# Patient Record
Sex: Female | Born: 1935 | Race: White | Hispanic: No | Smoking: Never smoker
Health system: Southern US, Community
[De-identification: ages and names within clinical notes are randomized; demographics above are authoritative.]

## PROBLEM LIST (undated history)

## (undated) DIAGNOSIS — G459 Transient cerebral ischemic attack, unspecified: Secondary | ICD-10-CM

## (undated) DIAGNOSIS — Z952 Presence of prosthetic heart valve: Secondary | ICD-10-CM

## (undated) DIAGNOSIS — N189 Chronic kidney disease, unspecified: Secondary | ICD-10-CM

## (undated) DIAGNOSIS — M1990 Unspecified osteoarthritis, unspecified site: Secondary | ICD-10-CM

## (undated) DIAGNOSIS — E7800 Pure hypercholesterolemia, unspecified: Secondary | ICD-10-CM

## (undated) DIAGNOSIS — I4891 Unspecified atrial fibrillation: Secondary | ICD-10-CM

## (undated) DIAGNOSIS — I272 Pulmonary hypertension, unspecified: Secondary | ICD-10-CM

## (undated) DIAGNOSIS — Z9889 Other specified postprocedural states: Secondary | ICD-10-CM

## (undated) DIAGNOSIS — D649 Anemia, unspecified: Secondary | ICD-10-CM

## (undated) DIAGNOSIS — I5032 Chronic diastolic (congestive) heart failure: Secondary | ICD-10-CM

## (undated) DIAGNOSIS — I10 Essential (primary) hypertension: Secondary | ICD-10-CM

## (undated) HISTORY — PX: APPENDECTOMY: SHX54

## (undated) HISTORY — PX: SHOULDER SURGERY: SHX246

## (undated) HISTORY — PX: KNEE ARTHROSCOPY: SUR90

## (undated) HISTORY — DX: Presence of prosthetic heart valve: Z95.2

## (undated) HISTORY — DX: Pulmonary hypertension, unspecified: I27.20

## (undated) HISTORY — PX: HIP FRACTURE SURGERY: SHX118

## (undated) HISTORY — DX: Essential (primary) hypertension: I10

## (undated) HISTORY — DX: Anemia, unspecified: D64.9

## (undated) HISTORY — DX: Unspecified osteoarthritis, unspecified site: M19.90

## (undated) HISTORY — DX: Chronic diastolic (congestive) heart failure: I50.32

## (undated) HISTORY — DX: Other specified postprocedural states: Z98.890

## (undated) HISTORY — DX: Chronic kidney disease, unspecified: N18.9

## (undated) HISTORY — PX: TONSILLECTOMY: SUR1361

## (undated) HISTORY — DX: Transient cerebral ischemic attack, unspecified: G45.9

## (undated) HISTORY — PX: HERNIA REPAIR: SHX51

## (undated) HISTORY — PX: ABDOMINAL HYSTERECTOMY: SHX81

## (undated) HISTORY — DX: Pure hypercholesterolemia, unspecified: E78.00

## (undated) HISTORY — PX: MITRAL VALVE REPLACEMENT: SHX147

---

## 1998-06-26 ENCOUNTER — Encounter: Admission: RE | Admit: 1998-06-26 | Discharge: 1998-09-24 | Payer: Self-pay | Admitting: *Deleted

## 2001-08-14 ENCOUNTER — Encounter: Payer: Self-pay | Admitting: *Deleted

## 2001-08-14 ENCOUNTER — Inpatient Hospital Stay (HOSPITAL_COMMUNITY): Admission: EM | Admit: 2001-08-14 | Discharge: 2001-08-25 | Payer: Self-pay | Admitting: *Deleted

## 2001-08-15 ENCOUNTER — Encounter: Payer: Self-pay | Admitting: Neurology

## 2001-08-16 ENCOUNTER — Encounter: Payer: Self-pay | Admitting: *Deleted

## 2002-01-15 ENCOUNTER — Emergency Department (HOSPITAL_COMMUNITY): Admission: EM | Admit: 2002-01-15 | Discharge: 2002-01-15 | Payer: Self-pay | Admitting: Emergency Medicine

## 2002-01-15 ENCOUNTER — Encounter: Payer: Self-pay | Admitting: Emergency Medicine

## 2002-03-08 ENCOUNTER — Other Ambulatory Visit: Admission: RE | Admit: 2002-03-08 | Discharge: 2002-03-08 | Payer: Self-pay | Admitting: Family Medicine

## 2002-10-04 ENCOUNTER — Encounter: Payer: Self-pay | Admitting: Internal Medicine

## 2002-10-04 ENCOUNTER — Ambulatory Visit (HOSPITAL_COMMUNITY): Admission: RE | Admit: 2002-10-04 | Discharge: 2002-10-05 | Payer: Self-pay | Admitting: Internal Medicine

## 2003-10-31 ENCOUNTER — Emergency Department (HOSPITAL_COMMUNITY): Admission: EM | Admit: 2003-10-31 | Discharge: 2003-10-31 | Payer: Self-pay | Admitting: Emergency Medicine

## 2003-11-10 ENCOUNTER — Inpatient Hospital Stay (HOSPITAL_COMMUNITY): Admission: EM | Admit: 2003-11-10 | Discharge: 2003-11-11 | Payer: Self-pay | Admitting: Emergency Medicine

## 2003-11-16 DIAGNOSIS — Z952 Presence of prosthetic heart valve: Secondary | ICD-10-CM

## 2003-11-16 DIAGNOSIS — Z9889 Other specified postprocedural states: Secondary | ICD-10-CM

## 2003-11-16 HISTORY — DX: Other specified postprocedural states: Z98.890

## 2003-11-16 HISTORY — DX: Presence of prosthetic heart valve: Z95.2

## 2003-12-25 ENCOUNTER — Inpatient Hospital Stay (HOSPITAL_COMMUNITY): Admission: EM | Admit: 2003-12-25 | Discharge: 2003-12-27 | Payer: Self-pay | Admitting: Emergency Medicine

## 2004-01-10 ENCOUNTER — Ambulatory Visit (HOSPITAL_COMMUNITY): Admission: RE | Admit: 2004-01-10 | Discharge: 2004-01-10 | Payer: Self-pay | Admitting: *Deleted

## 2004-02-10 ENCOUNTER — Inpatient Hospital Stay (HOSPITAL_COMMUNITY): Admission: EM | Admit: 2004-02-10 | Discharge: 2004-02-14 | Payer: Self-pay | Admitting: Emergency Medicine

## 2004-02-11 ENCOUNTER — Encounter: Payer: Self-pay | Admitting: Cardiovascular Disease

## 2004-02-18 ENCOUNTER — Inpatient Hospital Stay (HOSPITAL_BASED_OUTPATIENT_CLINIC_OR_DEPARTMENT_OTHER): Admission: RE | Admit: 2004-02-18 | Discharge: 2004-02-18 | Payer: Self-pay | Admitting: Cardiovascular Disease

## 2004-02-26 ENCOUNTER — Ambulatory Visit (HOSPITAL_COMMUNITY): Admission: RE | Admit: 2004-02-26 | Discharge: 2004-02-26 | Payer: Self-pay | Admitting: Cardiovascular Disease

## 2004-02-26 ENCOUNTER — Encounter: Payer: Self-pay | Admitting: Cardiovascular Disease

## 2004-04-09 ENCOUNTER — Observation Stay (HOSPITAL_COMMUNITY): Admission: AD | Admit: 2004-04-09 | Discharge: 2004-04-10 | Payer: Self-pay | Admitting: *Deleted

## 2004-09-17 ENCOUNTER — Ambulatory Visit: Payer: Self-pay | Admitting: Internal Medicine

## 2004-09-17 ENCOUNTER — Ambulatory Visit: Payer: Self-pay | Admitting: Hematology & Oncology

## 2004-09-23 ENCOUNTER — Ambulatory Visit: Payer: Self-pay | Admitting: *Deleted

## 2004-10-06 ENCOUNTER — Ambulatory Visit: Payer: Self-pay | Admitting: Internal Medicine

## 2004-10-14 ENCOUNTER — Ambulatory Visit: Payer: Self-pay | Admitting: Cardiology

## 2004-10-16 ENCOUNTER — Ambulatory Visit: Payer: Self-pay | Admitting: Cardiovascular Disease

## 2004-10-30 ENCOUNTER — Ambulatory Visit: Payer: Self-pay | Admitting: Cardiology

## 2004-11-12 ENCOUNTER — Ambulatory Visit: Payer: Self-pay | Admitting: Hematology & Oncology

## 2004-11-18 ENCOUNTER — Ambulatory Visit: Payer: Self-pay | Admitting: Cardiology

## 2004-12-09 ENCOUNTER — Ambulatory Visit: Payer: Self-pay | Admitting: Cardiology

## 2004-12-16 ENCOUNTER — Ambulatory Visit: Payer: Self-pay | Admitting: Cardiology

## 2004-12-25 ENCOUNTER — Ambulatory Visit: Payer: Self-pay | Admitting: Cardiology

## 2004-12-25 ENCOUNTER — Encounter: Admission: RE | Admit: 2004-12-25 | Discharge: 2004-12-25 | Payer: Self-pay | Admitting: Family Medicine

## 2005-01-06 ENCOUNTER — Ambulatory Visit: Payer: Self-pay | Admitting: Cardiology

## 2005-01-20 ENCOUNTER — Ambulatory Visit: Payer: Self-pay | Admitting: Hematology & Oncology

## 2005-01-20 ENCOUNTER — Ambulatory Visit: Payer: Self-pay | Admitting: Cardiovascular Disease

## 2005-01-27 ENCOUNTER — Ambulatory Visit: Payer: Self-pay | Admitting: Cardiology

## 2005-02-17 ENCOUNTER — Ambulatory Visit: Payer: Self-pay | Admitting: *Deleted

## 2005-03-03 ENCOUNTER — Ambulatory Visit: Payer: Self-pay | Admitting: Cardiology

## 2005-03-16 ENCOUNTER — Ambulatory Visit: Payer: Self-pay | Admitting: Hematology & Oncology

## 2005-03-17 ENCOUNTER — Ambulatory Visit: Payer: Self-pay | Admitting: *Deleted

## 2005-04-07 ENCOUNTER — Ambulatory Visit: Payer: Self-pay | Admitting: Cardiology

## 2005-04-16 ENCOUNTER — Ambulatory Visit: Payer: Self-pay | Admitting: Cardiology

## 2005-04-21 ENCOUNTER — Ambulatory Visit: Payer: Self-pay | Admitting: Cardiology

## 2005-04-28 ENCOUNTER — Ambulatory Visit: Payer: Self-pay | Admitting: *Deleted

## 2005-05-11 ENCOUNTER — Ambulatory Visit: Payer: Self-pay | Admitting: Hematology & Oncology

## 2005-05-12 ENCOUNTER — Ambulatory Visit: Payer: Self-pay | Admitting: Cardiology

## 2005-05-26 ENCOUNTER — Ambulatory Visit: Payer: Self-pay | Admitting: Cardiology

## 2005-06-16 ENCOUNTER — Ambulatory Visit: Payer: Self-pay | Admitting: *Deleted

## 2005-07-06 ENCOUNTER — Ambulatory Visit: Payer: Self-pay | Admitting: Hematology & Oncology

## 2005-07-14 ENCOUNTER — Ambulatory Visit: Payer: Self-pay | Admitting: Cardiology

## 2005-07-30 ENCOUNTER — Ambulatory Visit: Payer: Self-pay | Admitting: Cardiology

## 2005-08-11 ENCOUNTER — Ambulatory Visit: Payer: Self-pay | Admitting: Cardiology

## 2005-08-20 ENCOUNTER — Ambulatory Visit: Payer: Self-pay | Admitting: Cardiology

## 2005-08-31 ENCOUNTER — Ambulatory Visit: Payer: Self-pay | Admitting: Hematology & Oncology

## 2005-09-01 ENCOUNTER — Ambulatory Visit: Payer: Self-pay | Admitting: Cardiology

## 2005-09-22 ENCOUNTER — Ambulatory Visit: Payer: Self-pay | Admitting: Cardiology

## 2005-10-05 ENCOUNTER — Emergency Department (HOSPITAL_COMMUNITY): Admission: EM | Admit: 2005-10-05 | Discharge: 2005-10-05 | Payer: Self-pay | Admitting: Emergency Medicine

## 2005-10-06 ENCOUNTER — Ambulatory Visit: Payer: Self-pay | Admitting: Cardiology

## 2005-10-20 ENCOUNTER — Ambulatory Visit: Payer: Self-pay | Admitting: Cardiology

## 2005-10-26 ENCOUNTER — Ambulatory Visit: Payer: Self-pay | Admitting: Hematology & Oncology

## 2005-11-17 ENCOUNTER — Ambulatory Visit: Payer: Self-pay | Admitting: Cardiovascular Disease

## 2005-12-01 ENCOUNTER — Ambulatory Visit: Payer: Self-pay | Admitting: Cardiovascular Disease

## 2005-12-16 ENCOUNTER — Ambulatory Visit: Payer: Self-pay | Admitting: Cardiovascular Disease

## 2005-12-21 ENCOUNTER — Ambulatory Visit: Payer: Self-pay | Admitting: Hematology & Oncology

## 2005-12-22 ENCOUNTER — Encounter: Payer: Self-pay | Admitting: Cardiology

## 2005-12-22 ENCOUNTER — Ambulatory Visit: Payer: Self-pay | Admitting: Cardiology

## 2005-12-22 ENCOUNTER — Ambulatory Visit: Payer: Self-pay

## 2005-12-30 ENCOUNTER — Ambulatory Visit: Payer: Self-pay | Admitting: Cardiovascular Disease

## 2006-01-05 ENCOUNTER — Ambulatory Visit: Payer: Self-pay | Admitting: Cardiology

## 2006-01-18 ENCOUNTER — Ambulatory Visit: Payer: Self-pay | Admitting: *Deleted

## 2006-02-09 ENCOUNTER — Ambulatory Visit: Payer: Self-pay | Admitting: Cardiology

## 2006-02-15 ENCOUNTER — Ambulatory Visit: Payer: Self-pay | Admitting: Hematology & Oncology

## 2006-02-16 LAB — CBC & DIFF AND RETIC
BASO%: 1.2 % (ref 0.0–2.0)
EOS%: 3.7 % (ref 0.0–7.0)
HGB: 11.4 g/dL — ABNORMAL LOW (ref 11.6–15.9)
IRF: 0.41 — ABNORMAL HIGH (ref 0.130–0.330)
MCH: 32.3 pg (ref 26.0–34.0)
MCHC: 34.2 g/dL (ref 32.0–36.0)
MONO#: 0.3 10*3/uL (ref 0.1–0.9)
RDW: 14.2 % (ref 11.3–14.5)
RETIC #: 92.8 10*3/uL (ref 19.7–115.1)
WBC: 6.3 10*3/uL (ref 3.9–10.0)
lymph#: 0.9 10*3/uL (ref 0.9–3.3)

## 2006-02-16 LAB — FERRITIN: Ferritin: 721 ng/mL — ABNORMAL HIGH (ref 10–291)

## 2006-02-22 ENCOUNTER — Inpatient Hospital Stay (HOSPITAL_COMMUNITY): Admission: RE | Admit: 2006-02-22 | Discharge: 2006-03-03 | Payer: Self-pay | Admitting: Orthopedic Surgery

## 2006-02-22 ENCOUNTER — Ambulatory Visit: Payer: Self-pay | Admitting: Cardiology

## 2006-03-07 ENCOUNTER — Ambulatory Visit: Payer: Self-pay | Admitting: Internal Medicine

## 2006-03-14 ENCOUNTER — Ambulatory Visit: Payer: Self-pay | Admitting: Cardiology

## 2006-03-14 ENCOUNTER — Ambulatory Visit: Payer: Self-pay | Admitting: Cardiovascular Disease

## 2006-03-16 LAB — CBC WITH DIFFERENTIAL/PLATELET
BASO%: 0.1 % (ref 0.0–2.0)
HCT: 40.7 % (ref 34.8–46.6)
LYMPH%: 16.3 % (ref 14.0–48.0)
MCH: 32.6 pg (ref 26.0–34.0)
MCHC: 33.9 g/dL (ref 32.0–36.0)
MCV: 96.1 fL (ref 81.0–101.0)
MONO#: 0.3 10*3/uL (ref 0.1–0.9)
MONO%: 4.5 % (ref 0.0–13.0)
NEUT%: 71.9 % (ref 39.6–76.8)
Platelets: 208 10*3/uL (ref 145–400)
RBC: 4.23 10*6/uL (ref 3.70–5.32)
WBC: 7.4 10*3/uL (ref 3.9–10.0)

## 2006-03-30 ENCOUNTER — Ambulatory Visit: Payer: Self-pay | Admitting: Cardiology

## 2006-04-07 ENCOUNTER — Ambulatory Visit: Payer: Self-pay | Admitting: Hematology & Oncology

## 2006-04-13 ENCOUNTER — Ambulatory Visit: Payer: Self-pay | Admitting: Cardiovascular Disease

## 2006-04-13 LAB — CBC WITH DIFFERENTIAL/PLATELET
BASO%: 0.3 % (ref 0.0–2.0)
EOS%: 4.4 % (ref 0.0–7.0)
HCT: 38 % (ref 34.8–46.6)
LYMPH%: 18.1 % (ref 14.0–48.0)
MCH: 32.5 pg (ref 26.0–34.0)
MCHC: 34.2 g/dL (ref 32.0–36.0)
NEUT%: 72.1 % (ref 39.6–76.8)
RBC: 4 10*6/uL (ref 3.70–5.32)
WBC: 6.8 10*3/uL (ref 3.9–10.0)
lymph#: 1.2 10*3/uL (ref 0.9–3.3)

## 2006-05-04 ENCOUNTER — Ambulatory Visit: Payer: Self-pay | Admitting: *Deleted

## 2006-05-11 LAB — CBC WITH DIFFERENTIAL/PLATELET
Basophils Absolute: 0.1 10*3/uL (ref 0.0–0.1)
Eosinophils Absolute: 0.3 10*3/uL (ref 0.0–0.5)
HCT: 35.7 % (ref 34.8–46.6)
LYMPH%: 19.3 % (ref 14.0–48.0)
MCV: 94.9 fL (ref 81.0–101.0)
MONO#: 0.4 10*3/uL (ref 0.1–0.9)
MONO%: 5.5 % (ref 0.0–13.0)
NEUT#: 4.5 10*3/uL (ref 1.5–6.5)
NEUT%: 69.5 % (ref 39.6–76.8)
Platelets: 159 10*3/uL (ref 145–400)
RBC: 3.76 10*6/uL (ref 3.70–5.32)
WBC: 6.5 10*3/uL (ref 3.9–10.0)

## 2006-06-01 ENCOUNTER — Ambulatory Visit: Payer: Self-pay | Admitting: Cardiovascular Disease

## 2006-06-03 ENCOUNTER — Ambulatory Visit: Payer: Self-pay | Admitting: Hematology & Oncology

## 2006-06-08 LAB — CBC WITH DIFFERENTIAL/PLATELET
Basophils Absolute: 0 10*3/uL (ref 0.0–0.1)
Eosinophils Absolute: 0.3 10*3/uL (ref 0.0–0.5)
HGB: 13 g/dL (ref 11.6–15.9)
MONO%: 5.4 % (ref 0.0–13.0)
NEUT#: 3.3 10*3/uL (ref 1.5–6.5)
RBC: 3.94 10*6/uL (ref 3.70–5.32)
RDW: 14.5 % (ref 11.3–14.5)
WBC: 5.1 10*3/uL (ref 3.9–10.0)
lymph#: 1.3 10*3/uL (ref 0.9–3.3)

## 2006-06-29 ENCOUNTER — Ambulatory Visit: Payer: Self-pay | Admitting: Cardiology

## 2006-07-06 LAB — CBC WITH DIFFERENTIAL/PLATELET
BASO%: 0.7 % (ref 0.0–2.0)
Basophils Absolute: 0 10e3/uL (ref 0.0–0.1)
EOS%: 5 % (ref 0.0–7.0)
Eosinophils Absolute: 0.3 10e3/uL (ref 0.0–0.5)
HCT: 34 % — ABNORMAL LOW (ref 34.8–46.6)
HGB: 11.6 g/dL (ref 11.6–15.9)
LYMPH%: 20 % (ref 14.0–48.0)
MCH: 32.7 pg (ref 26.0–34.0)
MCHC: 34 g/dL (ref 32.0–36.0)
MCV: 96 fL (ref 81.0–101.0)
MONO#: 0.2 10e3/uL (ref 0.1–0.9)
MONO%: 4.2 % (ref 0.0–13.0)
NEUT#: 3.6 10e3/uL (ref 1.5–6.5)
NEUT%: 70.1 % (ref 39.6–76.8)
Platelets: 155 10e3/uL (ref 145–400)
RBC: 3.54 10e6/uL — ABNORMAL LOW (ref 3.70–5.32)
RDW: 14.4 % (ref 11.3–14.5)
WBC: 5.1 10e3/uL (ref 3.9–10.0)
lymph#: 1 10e3/uL (ref 0.9–3.3)

## 2006-07-06 LAB — FERRITIN: Ferritin: 835 ng/mL — ABNORMAL HIGH (ref 10–291)

## 2006-07-06 LAB — CHCC SMEAR

## 2006-07-13 ENCOUNTER — Ambulatory Visit: Payer: Self-pay | Admitting: Cardiology

## 2006-08-01 ENCOUNTER — Ambulatory Visit: Payer: Self-pay | Admitting: Hematology & Oncology

## 2006-08-03 LAB — CBC WITH DIFFERENTIAL/PLATELET
BASO%: 0.4 % (ref 0.0–2.0)
EOS%: 5.3 % (ref 0.0–7.0)
HGB: 12.3 g/dL (ref 11.6–15.9)
MCH: 32.7 pg (ref 26.0–34.0)
MCHC: 33.9 g/dL (ref 32.0–36.0)
MCV: 96.5 fL (ref 81.0–101.0)
MONO%: 6.3 % (ref 0.0–13.0)
RBC: 3.77 10*6/uL (ref 3.70–5.32)
RDW: 14.2 % (ref 11.3–14.5)
lymph#: 1 10*3/uL (ref 0.9–3.3)

## 2006-08-10 ENCOUNTER — Ambulatory Visit: Payer: Self-pay | Admitting: Cardiology

## 2006-09-07 ENCOUNTER — Ambulatory Visit: Payer: Self-pay | Admitting: Cardiology

## 2006-09-21 ENCOUNTER — Ambulatory Visit: Payer: Self-pay | Admitting: Cardiology

## 2006-09-26 ENCOUNTER — Ambulatory Visit: Payer: Self-pay | Admitting: Hematology & Oncology

## 2006-09-28 LAB — CBC WITH DIFFERENTIAL/PLATELET
Basophils Absolute: 0 10*3/uL (ref 0.0–0.1)
EOS%: 4.9 % (ref 0.0–7.0)
LYMPH%: 21.4 % (ref 14.0–48.0)
MCH: 32.7 pg (ref 26.0–34.0)
MCV: 96.5 fL (ref 81.0–101.0)
MONO%: 5.9 % (ref 0.0–13.0)
Platelets: 142 10*3/uL — ABNORMAL LOW (ref 145–400)
RBC: 3.81 10*6/uL (ref 3.70–5.32)
RDW: 14.5 % (ref 11.3–14.5)

## 2006-09-30 ENCOUNTER — Ambulatory Visit: Payer: Self-pay | Admitting: Cardiovascular Disease

## 2006-10-12 ENCOUNTER — Ambulatory Visit: Payer: Self-pay | Admitting: Cardiovascular Disease

## 2006-10-26 LAB — CBC WITH DIFFERENTIAL/PLATELET
Basophils Absolute: 0 10*3/uL (ref 0.0–0.1)
EOS%: 2.1 % (ref 0.0–7.0)
Eosinophils Absolute: 0.2 10*3/uL (ref 0.0–0.5)
HGB: 12.3 g/dL (ref 11.6–15.9)
MCH: 32.4 pg (ref 26.0–34.0)
NEUT#: 7.1 10*3/uL — ABNORMAL HIGH (ref 1.5–6.5)
RDW: 14 % (ref 11.3–14.5)
WBC: 8.9 10*3/uL (ref 3.9–10.0)
lymph#: 1.2 10*3/uL (ref 0.9–3.3)

## 2006-10-31 ENCOUNTER — Ambulatory Visit: Payer: Self-pay | Admitting: Cardiovascular Disease

## 2006-11-10 ENCOUNTER — Ambulatory Visit: Payer: Self-pay | Admitting: Internal Medicine

## 2006-11-14 ENCOUNTER — Ambulatory Visit: Payer: Self-pay | Admitting: Cardiovascular Disease

## 2006-11-18 ENCOUNTER — Ambulatory Visit: Payer: Self-pay | Admitting: Hematology & Oncology

## 2006-11-23 LAB — CBC WITH DIFFERENTIAL/PLATELET
Basophils Absolute: 0 10*3/uL (ref 0.0–0.1)
Eosinophils Absolute: 0.2 10*3/uL (ref 0.0–0.5)
HCT: 33.7 % — ABNORMAL LOW (ref 34.8–46.6)
HGB: 11.6 g/dL (ref 11.6–15.9)
LYMPH%: 22.7 % (ref 14.0–48.0)
MCV: 93.7 fL (ref 81.0–101.0)
MONO#: 0.4 10*3/uL (ref 0.1–0.9)
NEUT#: 3.4 10*3/uL (ref 1.5–6.5)
NEUT%: 65.1 % (ref 39.6–76.8)
Platelets: 153 10*3/uL (ref 145–400)
RBC: 3.6 10*6/uL — ABNORMAL LOW (ref 3.70–5.32)
WBC: 5.2 10*3/uL (ref 3.9–10.0)

## 2006-12-07 ENCOUNTER — Ambulatory Visit: Payer: Self-pay | Admitting: Cardiovascular Disease

## 2006-12-07 LAB — CONVERTED CEMR LAB
BUN: 14 mg/dL (ref 6–23)
CO2: 27 meq/L (ref 19–32)
Calcium: 8.7 mg/dL (ref 8.4–10.5)
Chloride: 104 meq/L (ref 96–112)
Creatinine, Ser: 1 mg/dL (ref 0.4–1.2)
GFR calc Af Amer: 70 mL/min
GFR calc non Af Amer: 58 mL/min
Glucose, Bld: 131 mg/dL — ABNORMAL HIGH (ref 70–99)
Potassium: 3.6 meq/L (ref 3.5–5.1)
Pro B Natriuretic peptide (BNP): 286 pg/mL — ABNORMAL HIGH (ref 0.0–100.0)
Sodium: 140 meq/L (ref 135–145)

## 2006-12-21 LAB — CBC WITH DIFFERENTIAL/PLATELET
BASO%: 0.9 % (ref 0.0–2.0)
HCT: 34.5 % — ABNORMAL LOW (ref 34.8–46.6)
LYMPH%: 19.2 % (ref 14.0–48.0)
MCHC: 34.5 g/dL (ref 32.0–36.0)
MCV: 95.6 fL (ref 81.0–101.0)
MONO#: 0.3 10*3/uL (ref 0.1–0.9)
MONO%: 4.7 % (ref 0.0–13.0)
NEUT%: 71.5 % (ref 39.6–76.8)
Platelets: 149 10*3/uL (ref 145–400)
WBC: 5.6 10*3/uL (ref 3.9–10.0)

## 2007-01-04 ENCOUNTER — Ambulatory Visit: Payer: Self-pay | Admitting: Cardiology

## 2007-01-13 ENCOUNTER — Ambulatory Visit: Payer: Self-pay | Admitting: Hematology & Oncology

## 2007-01-18 ENCOUNTER — Ambulatory Visit: Payer: Self-pay | Admitting: Cardiovascular Disease

## 2007-01-18 LAB — CONVERTED CEMR LAB
Bilirubin Urine: NEGATIVE
Crystals: NEGATIVE
Ketones, ur: NEGATIVE mg/dL
Mucus, UA: NEGATIVE
Nitrite: POSITIVE — AB
Specific Gravity, Urine: >=1.03 (ref 1.000–1.03)
Urine Glucose: NEGATIVE mg/dL
Urobilinogen, UA: 0.2 (ref 0.0–1.0)
pH: 5.5 (ref 5.0–8.0)

## 2007-01-18 LAB — CBC WITH DIFFERENTIAL/PLATELET
Basophils Absolute: 0 10*3/uL (ref 0.0–0.1)
EOS%: 5.4 % (ref 0.0–7.0)
HCT: 36.4 % (ref 34.8–46.6)
HGB: 12.6 g/dL (ref 11.6–15.9)
MCH: 32.5 pg (ref 26.0–34.0)
MCHC: 34.6 g/dL (ref 32.0–36.0)
MCV: 94.1 fL (ref 81.0–101.0)
MONO%: 6.2 % (ref 0.0–13.0)
NEUT%: 65.9 % (ref 39.6–76.8)
RDW: 12.3 % (ref 11.3–14.5)

## 2007-01-25 ENCOUNTER — Ambulatory Visit: Payer: Self-pay | Admitting: Cardiology

## 2007-02-15 ENCOUNTER — Ambulatory Visit: Payer: Self-pay | Admitting: Cardiology

## 2007-02-15 LAB — CBC WITH DIFFERENTIAL/PLATELET
BASO%: 1.1 % (ref 0.0–2.0)
EOS%: 4.5 % (ref 0.0–7.0)
MCH: 32.1 pg (ref 26.0–34.0)
MCHC: 34.4 g/dL (ref 32.0–36.0)
MCV: 93.4 fL (ref 81.0–101.0)
MONO%: 6.2 % (ref 0.0–13.0)
RBC: 3.63 10*6/uL — ABNORMAL LOW (ref 3.70–5.32)
RDW: 12.2 % (ref 11.3–14.5)

## 2007-02-27 ENCOUNTER — Emergency Department (HOSPITAL_COMMUNITY): Admission: EM | Admit: 2007-02-27 | Discharge: 2007-02-28 | Payer: Self-pay | Admitting: *Deleted

## 2007-03-10 ENCOUNTER — Ambulatory Visit: Payer: Self-pay | Admitting: Hematology & Oncology

## 2007-03-15 ENCOUNTER — Ambulatory Visit: Payer: Self-pay | Admitting: Cardiology

## 2007-03-15 LAB — CBC WITH DIFFERENTIAL/PLATELET
Eosinophils Absolute: 0.3 10*3/uL (ref 0.0–0.5)
MCV: 94.4 fL (ref 81.0–101.0)
MONO#: 0.4 10*3/uL (ref 0.1–0.9)
MONO%: 6.2 % (ref 0.0–13.0)
NEUT#: 3.6 10*3/uL (ref 1.5–6.5)
RBC: 3.98 10*6/uL (ref 3.70–5.32)
RDW: 13.8 % (ref 11.3–14.5)
WBC: 5.8 10*3/uL (ref 3.9–10.0)

## 2007-03-29 ENCOUNTER — Ambulatory Visit: Payer: Self-pay | Admitting: Cardiovascular Disease

## 2007-04-12 ENCOUNTER — Ambulatory Visit: Payer: Self-pay | Admitting: Cardiology

## 2007-04-12 LAB — CBC WITH DIFFERENTIAL/PLATELET
EOS%: 6.1 % (ref 0.0–7.0)
MCH: 32.2 pg (ref 26.0–34.0)
MCHC: 35.2 g/dL (ref 32.0–36.0)
MCV: 91.5 fL (ref 81.0–101.0)
MONO%: 5.4 % (ref 0.0–13.0)
NEUT#: 3 10*3/uL (ref 1.5–6.5)
RBC: 3.53 10*6/uL — ABNORMAL LOW (ref 3.70–5.32)
RDW: 13.1 % (ref 11.3–14.5)

## 2007-04-26 ENCOUNTER — Ambulatory Visit: Payer: Self-pay | Admitting: Cardiovascular Disease

## 2007-05-03 ENCOUNTER — Ambulatory Visit: Payer: Self-pay | Admitting: Cardiovascular Disease

## 2007-05-05 ENCOUNTER — Ambulatory Visit: Payer: Self-pay | Admitting: Hematology & Oncology

## 2007-05-10 LAB — CBC WITH DIFFERENTIAL/PLATELET
BASO%: 1.3 % (ref 0.0–2.0)
Eosinophils Absolute: 0.3 10*3/uL (ref 0.0–0.5)
MONO#: 0.3 10*3/uL (ref 0.1–0.9)
MONO%: 6 % (ref 0.0–13.0)
NEUT#: 3 10*3/uL (ref 1.5–6.5)
RBC: 3.77 10*6/uL (ref 3.70–5.32)
RDW: 13.2 % (ref 11.3–14.5)
WBC: 4.9 10*3/uL (ref 3.9–10.0)

## 2007-05-13 ENCOUNTER — Encounter: Admission: RE | Admit: 2007-05-13 | Discharge: 2007-05-13 | Payer: Self-pay | Admitting: Family Medicine

## 2007-05-24 ENCOUNTER — Ambulatory Visit: Payer: Self-pay | Admitting: Cardiology

## 2007-05-24 LAB — CONVERTED CEMR LAB
INR: 5 — ABNORMAL HIGH (ref 0.9–2.0)
Prothrombin Time: 28.8 s — ABNORMAL HIGH (ref 10.0–14.0)

## 2007-06-05 ENCOUNTER — Ambulatory Visit: Payer: Self-pay | Admitting: Cardiovascular Disease

## 2007-06-07 LAB — CBC WITH DIFFERENTIAL/PLATELET
BASO%: 0.9 % (ref 0.0–2.0)
Basophils Absolute: 0 10e3/uL (ref 0.0–0.1)
EOS%: 5.5 % (ref 0.0–7.0)
Eosinophils Absolute: 0.3 10e3/uL (ref 0.0–0.5)
HCT: 37.3 % (ref 34.8–46.6)
HGB: 12.5 g/dL (ref 11.6–15.9)
LYMPH%: 22.9 % (ref 14.0–48.0)
MCH: 32.2 pg (ref 26.0–34.0)
MCHC: 33.4 g/dL (ref 32.0–36.0)
MCV: 96.3 fL (ref 81.0–101.0)
MONO#: 0.2 10e3/uL (ref 0.1–0.9)
MONO%: 4.9 % (ref 0.0–13.0)
NEUT#: 3.3 10e3/uL (ref 1.5–6.5)
NEUT%: 65.8 % (ref 39.6–76.8)
Platelets: 128 10e3/uL — ABNORMAL LOW (ref 145–400)
RBC: 3.88 10e6/uL (ref 3.70–5.32)
RDW: 13.4 % (ref 11.3–14.5)
WBC: 5 10e3/uL (ref 3.9–10.0)
lymph#: 1.1 10e3/uL (ref 0.9–3.3)

## 2007-06-16 ENCOUNTER — Ambulatory Visit: Payer: Self-pay | Admitting: Hematology & Oncology

## 2007-06-21 LAB — CBC WITH DIFFERENTIAL/PLATELET
BASO%: 0.2 % (ref 0.0–2.0)
Basophils Absolute: 0 10*3/uL (ref 0.0–0.1)
Eosinophils Absolute: 0.3 10*3/uL (ref 0.0–0.5)
HCT: 38.2 % (ref 34.8–46.6)
HGB: 13.4 g/dL (ref 11.6–15.9)
LYMPH%: 13.4 % — ABNORMAL LOW (ref 14.0–48.0)
MCHC: 35 g/dL (ref 32.0–36.0)
MONO#: 0.3 10*3/uL (ref 0.1–0.9)
NEUT%: 78.8 % — ABNORMAL HIGH (ref 39.6–76.8)
Platelets: 131 10*3/uL — ABNORMAL LOW (ref 145–400)
WBC: 8.1 10*3/uL (ref 3.9–10.0)
lymph#: 1.1 10*3/uL (ref 0.9–3.3)

## 2007-06-28 ENCOUNTER — Ambulatory Visit: Payer: Self-pay | Admitting: Cardiology

## 2007-07-03 ENCOUNTER — Ambulatory Visit: Payer: Self-pay | Admitting: Cardiology

## 2007-07-06 ENCOUNTER — Ambulatory Visit (HOSPITAL_COMMUNITY): Admission: RE | Admit: 2007-07-06 | Discharge: 2007-07-07 | Payer: Self-pay | Admitting: Orthopedic Surgery

## 2007-07-10 ENCOUNTER — Ambulatory Visit: Payer: Self-pay | Admitting: Cardiology

## 2007-07-10 LAB — CONVERTED CEMR LAB
INR: 1.7 — ABNORMAL HIGH (ref 0.8–1.0)
Prothrombin Time: 16.1 s — ABNORMAL HIGH (ref 10.9–13.3)

## 2007-07-13 ENCOUNTER — Ambulatory Visit: Payer: Self-pay | Admitting: Internal Medicine

## 2007-07-13 LAB — CONVERTED CEMR LAB
INR: 2.8 — ABNORMAL HIGH (ref 0.8–1.0)
Prothrombin Time: 21.1 s — ABNORMAL HIGH (ref 10.9–13.3)

## 2007-07-24 ENCOUNTER — Ambulatory Visit: Payer: Self-pay | Admitting: Cardiology

## 2007-08-03 ENCOUNTER — Ambulatory Visit: Payer: Self-pay | Admitting: Cardiovascular Disease

## 2007-08-03 LAB — CONVERTED CEMR LAB
BUN: 15 mg/dL (ref 6–23)
CO2: 27 meq/L (ref 19–32)
Calcium: 8.9 mg/dL (ref 8.4–10.5)
Chloride: 105 meq/L (ref 96–112)
Creatinine, Ser: 0.9 mg/dL (ref 0.4–1.2)
GFR calc Af Amer: 79 mL/min
GFR calc non Af Amer: 65 mL/min
Glucose, Bld: 169 mg/dL — ABNORMAL HIGH (ref 70–99)
Potassium: 3.9 meq/L (ref 3.5–5.1)
Pro B Natriuretic peptide (BNP): 196 pg/mL — ABNORMAL HIGH (ref 0.0–100.0)
Sodium: 141 meq/L (ref 135–145)

## 2007-08-14 ENCOUNTER — Ambulatory Visit: Payer: Self-pay | Admitting: Cardiology

## 2007-08-21 ENCOUNTER — Ambulatory Visit: Payer: Self-pay | Admitting: Hematology & Oncology

## 2007-08-23 LAB — CBC WITH DIFFERENTIAL/PLATELET
BASO%: 0.4 % (ref 0.0–2.0)
EOS%: 4.6 % (ref 0.0–7.0)
HCT: 32.5 % — ABNORMAL LOW (ref 34.8–46.6)
HGB: 11.2 g/dL — ABNORMAL LOW (ref 11.6–15.9)
MCH: 32.7 pg (ref 26.0–34.0)
MCHC: 34.5 g/dL (ref 32.0–36.0)
MONO#: 0.3 10*3/uL (ref 0.1–0.9)
RDW: 14.5 % (ref 11.3–14.5)
WBC: 4.7 10*3/uL (ref 3.9–10.0)
lymph#: 1.1 10*3/uL (ref 0.9–3.3)

## 2007-09-06 ENCOUNTER — Encounter: Admission: RE | Admit: 2007-09-06 | Discharge: 2007-09-06 | Payer: Self-pay | Admitting: Family Medicine

## 2007-09-13 ENCOUNTER — Ambulatory Visit: Payer: Self-pay | Admitting: Cardiology

## 2007-10-11 ENCOUNTER — Ambulatory Visit: Payer: Self-pay | Admitting: Cardiology

## 2007-11-06 ENCOUNTER — Ambulatory Visit: Payer: Self-pay | Admitting: Cardiology

## 2007-11-20 ENCOUNTER — Ambulatory Visit: Payer: Self-pay | Admitting: Hematology & Oncology

## 2007-11-22 LAB — CBC WITH DIFFERENTIAL/PLATELET
Basophils Absolute: 0.1 10*3/uL (ref 0.0–0.1)
Eosinophils Absolute: 0.2 10*3/uL (ref 0.0–0.5)
HCT: 36.2 % (ref 34.8–46.6)
HGB: 12.6 g/dL (ref 11.6–15.9)
MONO#: 0.2 10*3/uL (ref 0.1–0.9)
NEUT#: 3.8 10*3/uL (ref 1.5–6.5)
NEUT%: 71.5 % (ref 39.6–76.8)
RDW: 14 % (ref 11.3–14.5)
lymph#: 1.1 10*3/uL (ref 0.9–3.3)

## 2007-11-29 ENCOUNTER — Ambulatory Visit: Payer: Self-pay | Admitting: Internal Medicine

## 2007-11-29 LAB — CONVERTED CEMR LAB
INR: 4.9 — ABNORMAL HIGH (ref 0.8–1.0)
Prothrombin Time: 28.7 s — ABNORMAL HIGH (ref 10.9–13.3)

## 2007-12-13 ENCOUNTER — Ambulatory Visit: Payer: Self-pay | Admitting: Internal Medicine

## 2007-12-27 ENCOUNTER — Ambulatory Visit: Payer: Self-pay | Admitting: Cardiovascular Disease

## 2008-01-17 ENCOUNTER — Ambulatory Visit: Payer: Self-pay | Admitting: Cardiovascular Disease

## 2008-01-17 ENCOUNTER — Ambulatory Visit: Payer: Self-pay | Admitting: Cardiology

## 2008-01-31 ENCOUNTER — Ambulatory Visit: Payer: Self-pay | Admitting: Cardiology

## 2008-02-14 ENCOUNTER — Ambulatory Visit: Payer: Self-pay | Admitting: Cardiology

## 2008-02-19 ENCOUNTER — Ambulatory Visit: Payer: Self-pay | Admitting: Hematology & Oncology

## 2008-02-21 LAB — CBC & DIFF AND RETIC
BASO%: 0.4 % (ref 0.0–2.0)
EOS%: 6.1 % (ref 0.0–7.0)
LYMPH%: 23.5 % (ref 14.0–48.0)
MCHC: 34.8 g/dL (ref 32.0–36.0)
MCV: 95 fL (ref 81.0–101.0)
MONO%: 4.9 % (ref 0.0–13.0)
Platelets: NORMAL 10*3/uL (ref 145–400)
RBC: 3.6 10*6/uL — ABNORMAL LOW (ref 3.70–5.32)
Retic %: 2.5 % — ABNORMAL HIGH (ref 0.4–2.3)

## 2008-02-21 LAB — IRON AND TIBC: %SAT: 36 % (ref 20–55)

## 2008-02-21 LAB — FERRITIN: Ferritin: 1207 ng/mL — ABNORMAL HIGH (ref 10–291)

## 2008-02-21 LAB — ERYTHROCYTE SEDIMENTATION RATE: Sed Rate: 20 mm/hr (ref 0–30)

## 2008-02-28 ENCOUNTER — Ambulatory Visit: Payer: Self-pay | Admitting: Internal Medicine

## 2008-03-14 ENCOUNTER — Ambulatory Visit: Payer: Self-pay | Admitting: Internal Medicine

## 2008-03-27 ENCOUNTER — Ambulatory Visit: Payer: Self-pay | Admitting: Internal Medicine

## 2008-04-17 ENCOUNTER — Ambulatory Visit: Payer: Self-pay | Admitting: Cardiovascular Disease

## 2008-04-25 ENCOUNTER — Ambulatory Visit: Payer: Self-pay | Admitting: Cardiology

## 2008-05-10 ENCOUNTER — Ambulatory Visit: Payer: Self-pay | Admitting: Cardiology

## 2008-05-22 ENCOUNTER — Ambulatory Visit: Payer: Self-pay | Admitting: Hematology & Oncology

## 2008-05-22 LAB — RETICULOCYTES (CHCC): RBC.: 3.89 MIL/uL (ref 3.87–5.11)

## 2008-05-24 ENCOUNTER — Ambulatory Visit: Payer: Self-pay | Admitting: Cardiology

## 2008-06-14 ENCOUNTER — Ambulatory Visit: Payer: Self-pay | Admitting: Cardiovascular Disease

## 2008-06-19 ENCOUNTER — Ambulatory Visit: Payer: Self-pay | Admitting: Cardiology

## 2008-06-26 ENCOUNTER — Ambulatory Visit: Payer: Self-pay | Admitting: Cardiovascular Disease

## 2008-07-08 ENCOUNTER — Ambulatory Visit: Payer: Self-pay | Admitting: Cardiovascular Disease

## 2008-07-30 ENCOUNTER — Ambulatory Visit: Payer: Self-pay | Admitting: Cardiovascular Disease

## 2008-08-09 ENCOUNTER — Ambulatory Visit: Payer: Self-pay | Admitting: Internal Medicine

## 2008-09-05 ENCOUNTER — Ambulatory Visit: Payer: Self-pay | Admitting: Cardiology

## 2008-09-11 DIAGNOSIS — Z9889 Other specified postprocedural states: Secondary | ICD-10-CM | POA: Insufficient documentation

## 2008-09-11 DIAGNOSIS — I5032 Chronic diastolic (congestive) heart failure: Secondary | ICD-10-CM

## 2008-09-11 DIAGNOSIS — I4891 Unspecified atrial fibrillation: Secondary | ICD-10-CM

## 2008-09-11 DIAGNOSIS — I10 Essential (primary) hypertension: Secondary | ICD-10-CM

## 2008-09-11 DIAGNOSIS — E7800 Pure hypercholesterolemia, unspecified: Secondary | ICD-10-CM | POA: Insufficient documentation

## 2008-09-11 DIAGNOSIS — G459 Transient cerebral ischemic attack, unspecified: Secondary | ICD-10-CM | POA: Insufficient documentation

## 2008-09-11 DIAGNOSIS — E1151 Type 2 diabetes mellitus with diabetic peripheral angiopathy without gangrene: Secondary | ICD-10-CM

## 2008-09-25 ENCOUNTER — Ambulatory Visit: Payer: Self-pay | Admitting: Hematology & Oncology

## 2008-09-26 LAB — CBC WITH DIFFERENTIAL (CANCER CENTER ONLY)
BASO#: 0 10*3/uL (ref 0.0–0.2)
BASO%: 0.6 % (ref 0.0–2.0)
EOS%: 5.6 % (ref 0.0–7.0)
Eosinophils Absolute: 0.4 10*3/uL (ref 0.0–0.5)
HCT: 36.8 % (ref 34.8–46.6)
HGB: 12.7 g/dL (ref 11.6–15.9)
LYMPH#: 0.9 10*3/uL (ref 0.9–3.3)
LYMPH%: 14.2 % (ref 14.0–48.0)
MCH: 32 pg (ref 26.0–34.0)
MCHC: 34.4 g/dL (ref 32.0–36.0)
MCV: 93 fL (ref 81–101)
MONO#: 0.3 10*3/uL (ref 0.1–0.9)
MONO%: 4.3 % (ref 0.0–13.0)
NEUT#: 5 10*3/uL (ref 1.5–6.5)
NEUT%: 75.3 % (ref 39.6–80.0)
Platelets: 148 10*3/uL (ref 145–400)
RBC: 3.95 10*6/uL (ref 3.70–5.32)
RDW: 11.6 % (ref 10.5–14.6)
WBC: 6.6 10*3/uL (ref 3.9–10.0)

## 2008-09-26 LAB — RETICULOCYTES (CHCC)
ABS Retic: 49 10*3/uL (ref 19.0–186.0)
RBC.: 4.08 MIL/uL (ref 3.87–5.11)

## 2008-09-26 LAB — CHCC SATELLITE - SMEAR

## 2008-09-26 LAB — FERRITIN: Ferritin: 1308 ng/mL — ABNORMAL HIGH (ref 10–291)

## 2008-10-07 ENCOUNTER — Ambulatory Visit: Payer: Self-pay | Admitting: Cardiovascular Disease

## 2008-10-07 ENCOUNTER — Ambulatory Visit: Payer: Self-pay | Admitting: Internal Medicine

## 2008-10-07 DIAGNOSIS — I6529 Occlusion and stenosis of unspecified carotid artery: Secondary | ICD-10-CM

## 2008-10-21 ENCOUNTER — Ambulatory Visit: Payer: Self-pay | Admitting: Cardiovascular Disease

## 2008-10-21 ENCOUNTER — Ambulatory Visit: Payer: Self-pay

## 2008-10-22 ENCOUNTER — Encounter: Payer: Self-pay | Admitting: Cardiovascular Disease

## 2008-10-22 LAB — CONVERTED CEMR LAB
BUN: 11 mg/dL (ref 6–23)
CO2: 29 meq/L (ref 19–32)
Calcium: 8.6 mg/dL (ref 8.4–10.5)
Chloride: 103 meq/L (ref 96–112)
Creatinine, Ser: 0.9 mg/dL (ref 0.4–1.2)
GFR calc Af Amer: 78 mL/min
GFR calc non Af Amer: 65 mL/min
Glucose, Bld: 134 mg/dL — ABNORMAL HIGH (ref 70–99)
Potassium: 3.5 meq/L (ref 3.5–5.1)
Sodium: 140 meq/L (ref 135–145)

## 2008-10-24 ENCOUNTER — Ambulatory Visit: Payer: Self-pay | Admitting: Cardiology

## 2008-10-28 ENCOUNTER — Ambulatory Visit: Payer: Self-pay | Admitting: Family Medicine

## 2008-10-28 DIAGNOSIS — D649 Anemia, unspecified: Secondary | ICD-10-CM | POA: Insufficient documentation

## 2008-10-28 LAB — CONVERTED CEMR LAB
ALT: 26 units/L (ref 0–35)
AST: 24 units/L (ref 0–37)
Albumin: 3.4 g/dL — ABNORMAL LOW (ref 3.5–5.2)
Alkaline Phosphatase: 105 units/L (ref 39–117)
Basophils Absolute: 0.2 10*3/uL — ABNORMAL HIGH (ref 0.0–0.1)
Basophils Relative: 1.6 % (ref 0.0–3.0)
Bilirubin, Direct: 0.1 mg/dL (ref 0.0–0.3)
Cholesterol: 131 mg/dL (ref 0–200)
Eosinophils Absolute: 0.3 10*3/uL (ref 0.0–0.7)
Eosinophils Relative: 2.9 % (ref 0.0–5.0)
HCT: 39 % (ref 36.0–46.0)
HDL: 40.5 mg/dL (ref >39.0)
Hemoglobin: 13.3 g/dL (ref 12.0–15.0)
Hgb A1c MFr Bld: 6.2 % — ABNORMAL HIGH (ref 4.6–6.0)
LDL Cholesterol: 73 mg/dL (ref 0–99)
Lymphocytes Relative: 12.2 % (ref 12.0–46.0)
MCHC: 34 g/dL (ref 30.0–36.0)
MCV: 91.4 fL (ref 78.0–100.0)
Monocytes Absolute: 0.3 10*3/uL (ref 0.1–1.0)
Monocytes Relative: 2.4 % — ABNORMAL LOW (ref 3.0–12.0)
Neutro Abs: 9.1 10*3/uL — ABNORMAL HIGH (ref 1.4–7.7)
Neutrophils Relative %: 80.9 % — ABNORMAL HIGH (ref 43.0–77.0)
Platelets: 244 10*3/uL (ref 150–400)
RBC: 4.27 M/uL (ref 3.87–5.11)
RDW: 15.5 % — ABNORMAL HIGH (ref 11.5–14.6)
TSH: 3.15 microintl units/mL (ref 0.35–5.50)
Total Bilirubin: 0.6 mg/dL (ref 0.3–1.2)
Total CHOL/HDL Ratio: 3.2
Total Protein: 6.8 g/dL (ref 6.0–8.3)
Triglycerides: 86 mg/dL (ref 0–149)
VLDL: 17 mg/dL (ref 0–40)
WBC: 11.3 10*3/uL — ABNORMAL HIGH (ref 4.5–10.5)

## 2008-10-30 ENCOUNTER — Ambulatory Visit: Payer: Self-pay | Admitting: Family Medicine

## 2008-10-31 ENCOUNTER — Telehealth (INDEPENDENT_AMBULATORY_CARE_PROVIDER_SITE_OTHER): Payer: Self-pay | Admitting: *Deleted

## 2008-10-31 LAB — CONVERTED CEMR LAB
ALT: 12 units/L (ref 0–35)
AST: 19 units/L (ref 0–37)
Albumin: 4 g/dL (ref 3.5–5.2)
Alkaline Phosphatase: 77 units/L (ref 39–117)
Basophils Absolute: 0 10*3/uL (ref 0.0–0.1)
Basophils Relative: 0.2 % (ref 0.0–3.0)
Bilirubin, Direct: 0.2 mg/dL (ref 0.0–0.3)
Cholesterol: 187 mg/dL (ref 0–200)
Eosinophils Absolute: 0.2 10*3/uL (ref 0.0–0.7)
Eosinophils Relative: 5.5 % — ABNORMAL HIGH (ref 0.0–5.0)
HCT: 36.2 % (ref 36.0–46.0)
HDL: 40.9 mg/dL (ref >39.0)
Hemoglobin: 12.6 g/dL (ref 12.0–15.0)
Hgb A1c MFr Bld: 6.6 % — ABNORMAL HIGH (ref 4.6–6.0)
LDL Cholesterol: 121 mg/dL — ABNORMAL HIGH (ref 0–99)
Lymphocytes Relative: 20.3 % (ref 12.0–46.0)
MCHC: 34.7 g/dL (ref 30.0–36.0)
MCV: 94.8 fL (ref 78.0–100.0)
Monocytes Absolute: 0.3 10*3/uL (ref 0.1–1.0)
Monocytes Relative: 8.4 % (ref 3.0–12.0)
Neutro Abs: 2.7 10*3/uL (ref 1.4–7.7)
Neutrophils Relative %: 65.6 % (ref 43.0–77.0)
Platelets: 101 10*3/uL — ABNORMAL LOW (ref 150–400)
RBC: 3.81 M/uL — ABNORMAL LOW (ref 3.87–5.11)
RDW: 13.6 % (ref 11.5–14.6)
TSH: 3.4 microintl units/mL (ref 0.35–5.50)
Total Bilirubin: 0.8 mg/dL (ref 0.3–1.2)
Total CHOL/HDL Ratio: 4.6
Total Protein: 7.3 g/dL (ref 6.0–8.3)
Triglycerides: 128 mg/dL (ref 0–149)
VLDL: 26 mg/dL (ref 0–40)
WBC: 4 10*3/uL — ABNORMAL LOW (ref 4.5–10.5)

## 2008-11-04 ENCOUNTER — Ambulatory Visit: Payer: Self-pay | Admitting: Cardiology

## 2008-11-19 ENCOUNTER — Ambulatory Visit: Payer: Self-pay | Admitting: Vascular Surgery

## 2008-11-25 ENCOUNTER — Ambulatory Visit: Payer: Self-pay | Admitting: Internal Medicine

## 2008-11-28 ENCOUNTER — Ambulatory Visit: Payer: Self-pay | Admitting: Family Medicine

## 2008-11-28 DIAGNOSIS — T887XXA Unspecified adverse effect of drug or medicament, initial encounter: Secondary | ICD-10-CM | POA: Insufficient documentation

## 2008-12-10 ENCOUNTER — Encounter (INDEPENDENT_AMBULATORY_CARE_PROVIDER_SITE_OTHER): Payer: Self-pay | Admitting: *Deleted

## 2008-12-10 LAB — CONVERTED CEMR LAB
ALT: 17 units/L (ref 0–35)
AST: 19 units/L (ref 0–37)
Albumin: 4.1 g/dL (ref 3.5–5.2)
Alkaline Phosphatase: 109 units/L (ref 39–117)
Basophils Absolute: 0 10*3/uL (ref 0.0–0.1)
Basophils Relative: 0.1 % (ref 0.0–3.0)
Bilirubin, Direct: 0.1 mg/dL (ref 0.0–0.3)
Eosinophils Absolute: 0.3 10*3/uL (ref 0.0–0.7)
Eosinophils Relative: 4 % (ref 0.0–5.0)
HCT: 38.1 % (ref 36.0–46.0)
Hemoglobin: 13.1 g/dL (ref 12.0–15.0)
Lymphocytes Relative: 14.4 % (ref 12.0–46.0)
MCHC: 34.3 g/dL (ref 30.0–36.0)
MCV: 95.2 fL (ref 78.0–100.0)
Monocytes Absolute: 0.3 10*3/uL (ref 0.1–1.0)
Monocytes Relative: 4.3 % (ref 3.0–12.0)
Neutro Abs: 5 10*3/uL (ref 1.4–7.7)
Neutrophils Relative %: 77.2 % — ABNORMAL HIGH (ref 43.0–77.0)
Platelets: 118 10*3/uL — ABNORMAL LOW (ref 150–400)
RBC: 4 M/uL (ref 3.87–5.11)
RDW: 13.7 % (ref 11.5–14.6)
Total Bilirubin: 0.9 mg/dL (ref 0.3–1.2)
Total Protein: 7.4 g/dL (ref 6.0–8.3)
WBC: 6.6 10*3/uL (ref 4.5–10.5)

## 2008-12-19 ENCOUNTER — Ambulatory Visit: Payer: Self-pay | Admitting: Internal Medicine

## 2008-12-24 ENCOUNTER — Ambulatory Visit: Payer: Self-pay | Admitting: Cardiovascular Disease

## 2008-12-24 LAB — CONVERTED CEMR LAB
BUN: 12 mg/dL (ref 6–23)
CO2: 26 meq/L (ref 19–32)
Calcium: 8.3 mg/dL — ABNORMAL LOW (ref 8.4–10.5)
Chloride: 102 meq/L (ref 96–112)
Creatinine, Ser: 0.8 mg/dL (ref 0.4–1.2)
GFR calc Af Amer: 90 mL/min
GFR calc non Af Amer: 74 mL/min
Glucose, Bld: 107 mg/dL — ABNORMAL HIGH (ref 70–99)
Potassium: 3.6 meq/L (ref 3.5–5.1)
Sodium: 137 meq/L (ref 135–145)

## 2009-01-16 ENCOUNTER — Ambulatory Visit: Payer: Self-pay | Admitting: Cardiology

## 2009-01-29 ENCOUNTER — Ambulatory Visit: Payer: Self-pay | Admitting: Hematology & Oncology

## 2009-01-30 LAB — CBC WITH DIFFERENTIAL (CANCER CENTER ONLY)
BASO%: 0.2 % (ref 0.0–2.0)
EOS%: 6 % (ref 0.0–7.0)
HCT: 36.9 % (ref 34.8–46.6)
LYMPH%: 18.5 % (ref 14.0–48.0)
MCHC: 33.2 g/dL (ref 32.0–36.0)
MCV: 97 fL (ref 81–101)
NEUT%: 70.1 % (ref 39.6–80.0)
RDW: 11.9 % (ref 10.5–14.6)

## 2009-01-30 LAB — FERRITIN: Ferritin: 1537 ng/mL — ABNORMAL HIGH (ref 10–291)

## 2009-01-30 LAB — RETICULOCYTES (CHCC): Retic Ct Pct: 1.1 % (ref 0.4–3.1)

## 2009-02-06 ENCOUNTER — Ambulatory Visit: Payer: Self-pay | Admitting: Internal Medicine

## 2009-02-10 ENCOUNTER — Ambulatory Visit: Payer: Self-pay | Admitting: Family Medicine

## 2009-02-12 ENCOUNTER — Encounter (INDEPENDENT_AMBULATORY_CARE_PROVIDER_SITE_OTHER): Payer: Self-pay | Admitting: *Deleted

## 2009-02-12 LAB — CONVERTED CEMR LAB
ALT: 16 units/L (ref 0–35)
AST: 21 units/L (ref 0–37)
Albumin: 4.1 g/dL (ref 3.5–5.2)
Alkaline Phosphatase: 94 units/L (ref 39–117)
Bilirubin, Direct: 0.2 mg/dL (ref 0.0–0.3)
Hgb A1c MFr Bld: 6.8 % — ABNORMAL HIGH (ref 4.6–6.5)
Total Bilirubin: 1 mg/dL (ref 0.3–1.2)
Total Protein: 7.5 g/dL (ref 6.0–8.3)

## 2009-02-25 ENCOUNTER — Encounter: Payer: Self-pay | Admitting: Family Medicine

## 2009-03-05 ENCOUNTER — Ambulatory Visit: Payer: Self-pay | Admitting: Cardiology

## 2009-03-06 ENCOUNTER — Telehealth: Payer: Self-pay | Admitting: Cardiovascular Disease

## 2009-03-14 ENCOUNTER — Telehealth: Payer: Self-pay | Admitting: Cardiology

## 2009-03-19 ENCOUNTER — Ambulatory Visit: Payer: Self-pay | Admitting: Cardiovascular Disease

## 2009-04-02 ENCOUNTER — Ambulatory Visit: Payer: Self-pay | Admitting: Cardiology

## 2009-04-10 ENCOUNTER — Ambulatory Visit: Payer: Self-pay | Admitting: Internal Medicine

## 2009-04-10 ENCOUNTER — Inpatient Hospital Stay (HOSPITAL_COMMUNITY): Admission: EM | Admit: 2009-04-10 | Discharge: 2009-04-16 | Payer: Self-pay | Admitting: Emergency Medicine

## 2009-04-15 ENCOUNTER — Ambulatory Visit: Payer: Self-pay | Admitting: Physical Medicine & Rehabilitation

## 2009-04-15 ENCOUNTER — Encounter: Payer: Self-pay | Admitting: *Deleted

## 2009-04-16 ENCOUNTER — Ambulatory Visit: Payer: Self-pay | Admitting: Cardiovascular Disease

## 2009-04-16 ENCOUNTER — Ambulatory Visit: Payer: Self-pay | Admitting: Physical Medicine & Rehabilitation

## 2009-04-16 ENCOUNTER — Inpatient Hospital Stay (HOSPITAL_COMMUNITY)
Admission: RE | Admit: 2009-04-16 | Discharge: 2009-04-25 | Payer: Self-pay | Admitting: Physical Medicine & Rehabilitation

## 2009-04-16 ENCOUNTER — Encounter: Payer: Self-pay | Admitting: Family Medicine

## 2009-04-29 ENCOUNTER — Encounter: Payer: Self-pay | Admitting: Internal Medicine

## 2009-04-29 LAB — CONVERTED CEMR LAB
POC INR: 4.4
Protime: 52.3

## 2009-05-05 ENCOUNTER — Encounter: Payer: Self-pay | Admitting: Internal Medicine

## 2009-05-05 LAB — CONVERTED CEMR LAB: Protime: 20.7

## 2009-05-12 ENCOUNTER — Encounter: Payer: Self-pay | Admitting: Cardiology

## 2009-05-12 LAB — CONVERTED CEMR LAB
POC INR: 5.6
Prothrombin Time: 28.8 s

## 2009-05-14 ENCOUNTER — Encounter: Payer: Self-pay | Admitting: Cardiology

## 2009-05-15 ENCOUNTER — Encounter: Payer: Self-pay | Admitting: Cardiology

## 2009-05-15 LAB — CONVERTED CEMR LAB
POC INR: 1.9
Prothrombin Time: 16.8 s

## 2009-05-21 ENCOUNTER — Encounter: Payer: Self-pay | Admitting: *Deleted

## 2009-05-21 ENCOUNTER — Ambulatory Visit: Payer: Self-pay | Admitting: Internal Medicine

## 2009-05-21 LAB — CONVERTED CEMR LAB: Prothrombin Time: 22.7 s

## 2009-06-05 ENCOUNTER — Ambulatory Visit: Payer: Self-pay

## 2009-06-05 ENCOUNTER — Ambulatory Visit: Payer: Self-pay | Admitting: Cardiovascular Disease

## 2009-06-10 ENCOUNTER — Encounter: Admission: RE | Admit: 2009-06-10 | Discharge: 2009-08-21 | Payer: Self-pay | Admitting: Orthopedic Surgery

## 2009-06-11 ENCOUNTER — Ambulatory Visit: Payer: Self-pay | Admitting: Family Medicine

## 2009-06-11 DIAGNOSIS — R5383 Other fatigue: Secondary | ICD-10-CM

## 2009-06-11 DIAGNOSIS — R634 Abnormal weight loss: Secondary | ICD-10-CM

## 2009-06-11 DIAGNOSIS — I959 Hypotension, unspecified: Secondary | ICD-10-CM

## 2009-06-12 LAB — CONVERTED CEMR LAB
Basophils Absolute: 0.1 10*3/uL (ref 0.0–0.1)
Basophils Relative: 1.3 % (ref 0.0–3.0)
Eosinophils Absolute: 0.2 10*3/uL (ref 0.0–0.7)
Eosinophils Relative: 5 % (ref 0.0–5.0)
HCT: 36.5 % (ref 36.0–46.0)
Hemoglobin: 12.4 g/dL (ref 12.0–15.0)
Hgb A1c MFr Bld: 5.6 % (ref 4.6–6.5)
Lymphocytes Relative: 26.1 % (ref 12.0–46.0)
Lymphs Abs: 1.2 10*3/uL (ref 0.7–4.0)
MCHC: 33.9 g/dL (ref 30.0–36.0)
MCV: 96.6 fL (ref 78.0–100.0)
Monocytes Absolute: 0.2 10*3/uL (ref 0.1–1.0)
Monocytes Relative: 3.5 % (ref 3.0–12.0)
Neutro Abs: 3 10*3/uL (ref 1.4–7.7)
Neutrophils Relative %: 64.1 % (ref 43.0–77.0)
Platelets: 135 10*3/uL — ABNORMAL LOW (ref 150.0–400.0)
RBC: 3.78 M/uL — ABNORMAL LOW (ref 3.87–5.11)
RDW: 14 % (ref 11.5–14.6)
TSH: 1.09 microintl units/mL (ref 0.35–5.50)
Vit D, 25-Hydroxy: 8 ng/mL — ABNORMAL LOW (ref 30–89)
WBC: 4.7 10*3/uL (ref 4.5–10.5)

## 2009-06-13 ENCOUNTER — Telehealth (INDEPENDENT_AMBULATORY_CARE_PROVIDER_SITE_OTHER): Payer: Self-pay | Admitting: *Deleted

## 2009-06-26 ENCOUNTER — Ambulatory Visit: Payer: Self-pay | Admitting: Internal Medicine

## 2009-06-26 ENCOUNTER — Encounter (INDEPENDENT_AMBULATORY_CARE_PROVIDER_SITE_OTHER): Payer: Self-pay | Admitting: Cardiology

## 2009-07-01 ENCOUNTER — Ambulatory Visit: Payer: Self-pay | Admitting: Hematology & Oncology

## 2009-07-02 LAB — CBC WITH DIFFERENTIAL (CANCER CENTER ONLY)
BASO%: 0.5 % (ref 0.0–2.0)
Eosinophils Absolute: 0.3 10*3/uL (ref 0.0–0.5)
HCT: 36.6 % (ref 34.8–46.6)
LYMPH%: 24.6 % (ref 14.0–48.0)
MCH: 31.6 pg (ref 26.0–34.0)
MCV: 95 fL (ref 81–101)
MONO#: 0.3 10*3/uL (ref 0.1–0.9)
MONO%: 5.5 % (ref 0.0–13.0)
NEUT%: 63.9 % (ref 39.6–80.0)
RDW: 12.8 % (ref 10.5–14.6)
WBC: 5.3 10*3/uL (ref 3.9–10.0)

## 2009-07-02 LAB — RETICULOCYTES (CHCC): RBC.: 3.92 MIL/uL (ref 3.87–5.11)

## 2009-07-10 ENCOUNTER — Ambulatory Visit: Payer: Self-pay | Admitting: Family Medicine

## 2009-07-10 DIAGNOSIS — M25559 Pain in unspecified hip: Secondary | ICD-10-CM | POA: Insufficient documentation

## 2009-07-17 ENCOUNTER — Ambulatory Visit: Payer: Self-pay | Admitting: Internal Medicine

## 2009-08-14 ENCOUNTER — Ambulatory Visit: Payer: Self-pay | Admitting: Cardiology

## 2009-09-03 ENCOUNTER — Encounter (INDEPENDENT_AMBULATORY_CARE_PROVIDER_SITE_OTHER): Payer: Self-pay | Admitting: *Deleted

## 2009-09-04 ENCOUNTER — Ambulatory Visit: Payer: Self-pay | Admitting: Cardiology

## 2009-09-08 ENCOUNTER — Ambulatory Visit: Payer: Self-pay | Admitting: Family Medicine

## 2009-09-08 DIAGNOSIS — E559 Vitamin D deficiency, unspecified: Secondary | ICD-10-CM

## 2009-09-11 ENCOUNTER — Telehealth (INDEPENDENT_AMBULATORY_CARE_PROVIDER_SITE_OTHER): Payer: Self-pay | Admitting: *Deleted

## 2009-09-11 LAB — CONVERTED CEMR LAB
Bilirubin, Direct: 0.1 mg/dL (ref 0.0–0.3)
CO2: 29 meq/L (ref 19–32)
Chloride: 101 meq/L (ref 96–112)
GFR calc non Af Amer: 73.93 mL/min (ref >60)
Glucose, Bld: 113 mg/dL — ABNORMAL HIGH (ref 70–99)
HDL: 37.7 mg/dL — ABNORMAL LOW (ref >39.00)
Potassium: 4 meq/L (ref 3.5–5.1)
Sodium: 140 meq/L (ref 135–145)
Total Bilirubin: 0.8 mg/dL (ref 0.3–1.2)
Total Protein: 7.1 g/dL (ref 6.0–8.3)
VLDL: 44.4 mg/dL — ABNORMAL HIGH (ref 0.0–40.0)
Vit D, 25-Hydroxy: 16 ng/mL — ABNORMAL LOW (ref 30–89)

## 2009-09-25 ENCOUNTER — Ambulatory Visit: Payer: Self-pay | Admitting: Cardiovascular Disease

## 2009-09-25 LAB — CONVERTED CEMR LAB: POC INR: 2.1

## 2009-09-29 ENCOUNTER — Telehealth: Payer: Self-pay | Admitting: Cardiovascular Disease

## 2009-10-02 ENCOUNTER — Encounter: Payer: Self-pay | Admitting: Cardiovascular Disease

## 2009-10-10 ENCOUNTER — Ambulatory Visit: Payer: Self-pay | Admitting: Internal Medicine

## 2009-10-23 ENCOUNTER — Ambulatory Visit: Payer: Self-pay | Admitting: Hematology & Oncology

## 2009-10-24 ENCOUNTER — Encounter: Payer: Self-pay | Admitting: Internal Medicine

## 2009-10-24 LAB — CBC WITH DIFFERENTIAL (CANCER CENTER ONLY)
BASO#: 0 10*3/uL (ref 0.0–0.2)
Eosinophils Absolute: 0.4 10*3/uL (ref 0.0–0.5)
HCT: 34.7 % — ABNORMAL LOW (ref 34.8–46.6)
HGB: 11.9 g/dL (ref 11.6–15.9)
LYMPH%: 18.2 % (ref 14.0–48.0)
MCH: 32.6 pg (ref 26.0–34.0)
MCV: 95 fL (ref 81–101)
MONO#: 0.4 10*3/uL (ref 0.1–0.9)
NEUT%: 68.9 % (ref 39.6–80.0)
Platelets: 145 10*3/uL (ref 145–400)
RBC: 3.66 10*6/uL — ABNORMAL LOW (ref 3.70–5.32)
WBC: 6.1 10*3/uL (ref 3.9–10.0)

## 2009-10-24 LAB — FERRITIN: Ferritin: 1660 ng/mL — ABNORMAL HIGH (ref 10–291)

## 2009-10-24 LAB — RETICULOCYTES (CHCC): ABS Retic: 47.3 10*3/uL (ref 19.0–186.0)

## 2009-10-28 ENCOUNTER — Encounter: Admission: RE | Admit: 2009-10-28 | Discharge: 2009-11-13 | Payer: Self-pay | Admitting: Orthopedic Surgery

## 2009-11-04 ENCOUNTER — Ambulatory Visit: Payer: Self-pay | Admitting: Internal Medicine

## 2009-11-13 ENCOUNTER — Encounter
Admission: RE | Admit: 2009-11-13 | Discharge: 2010-01-13 | Payer: Self-pay | Source: Home / Self Care | Admitting: Orthopedic Surgery

## 2009-12-02 ENCOUNTER — Ambulatory Visit: Payer: Self-pay | Admitting: Cardiovascular Disease

## 2009-12-02 ENCOUNTER — Ambulatory Visit: Payer: Self-pay | Admitting: Cardiology

## 2009-12-02 ENCOUNTER — Encounter (INDEPENDENT_AMBULATORY_CARE_PROVIDER_SITE_OTHER): Payer: Self-pay | Admitting: Cardiology

## 2009-12-02 ENCOUNTER — Ambulatory Visit: Payer: Self-pay

## 2009-12-09 ENCOUNTER — Encounter: Payer: Self-pay | Admitting: Cardiovascular Disease

## 2009-12-22 ENCOUNTER — Ambulatory Visit: Payer: Self-pay | Admitting: Cardiovascular Disease

## 2009-12-22 LAB — CONVERTED CEMR LAB: POC INR: 3.2

## 2009-12-30 ENCOUNTER — Ambulatory Visit: Payer: Self-pay | Admitting: Family Medicine

## 2010-01-08 ENCOUNTER — Encounter (INDEPENDENT_AMBULATORY_CARE_PROVIDER_SITE_OTHER): Payer: Self-pay | Admitting: *Deleted

## 2010-01-12 ENCOUNTER — Ambulatory Visit: Payer: Self-pay | Admitting: Cardiology

## 2010-01-22 ENCOUNTER — Ambulatory Visit: Payer: Self-pay | Admitting: Cardiology

## 2010-02-02 ENCOUNTER — Emergency Department (HOSPITAL_BASED_OUTPATIENT_CLINIC_OR_DEPARTMENT_OTHER): Admission: EM | Admit: 2010-02-02 | Discharge: 2010-02-02 | Payer: Self-pay | Admitting: Emergency Medicine

## 2010-02-02 ENCOUNTER — Telehealth (INDEPENDENT_AMBULATORY_CARE_PROVIDER_SITE_OTHER): Payer: Self-pay | Admitting: *Deleted

## 2010-02-02 ENCOUNTER — Ambulatory Visit: Payer: Self-pay | Admitting: Diagnostic Radiology

## 2010-02-12 ENCOUNTER — Ambulatory Visit: Payer: Self-pay | Admitting: Internal Medicine

## 2010-02-12 LAB — CONVERTED CEMR LAB: POC INR: 3.2

## 2010-02-19 ENCOUNTER — Ambulatory Visit: Payer: Self-pay | Admitting: Hematology & Oncology

## 2010-02-23 ENCOUNTER — Encounter: Payer: Self-pay | Admitting: Family Medicine

## 2010-02-23 LAB — CBC WITH DIFFERENTIAL (CANCER CENTER ONLY)
BASO#: 0 10*3/uL (ref 0.0–0.2)
BASO%: 0.3 % (ref 0.0–2.0)
EOS%: 4.1 % (ref 0.0–7.0)
HGB: 12 g/dL (ref 11.6–15.9)
LYMPH#: 1 10*3/uL (ref 0.9–3.3)
MCH: 31.5 pg (ref 26.0–34.0)
MCHC: 32.9 g/dL (ref 32.0–36.0)
MONO%: 3.7 % (ref 0.0–13.0)
NEUT#: 4.1 10*3/uL (ref 1.5–6.5)
Platelets: 155 10*3/uL (ref 145–400)
RDW: 11.6 % (ref 10.5–14.6)

## 2010-02-23 LAB — RETICULOCYTES (CHCC)
RBC.: 3.74 MIL/uL — ABNORMAL LOW (ref 3.87–5.11)
Retic Ct Pct: 1.6 % (ref 0.4–3.1)

## 2010-02-23 LAB — CHCC SATELLITE - SMEAR

## 2010-02-26 ENCOUNTER — Encounter (INDEPENDENT_AMBULATORY_CARE_PROVIDER_SITE_OTHER): Payer: Self-pay | Admitting: *Deleted

## 2010-03-10 ENCOUNTER — Ambulatory Visit: Payer: Self-pay | Admitting: Family Medicine

## 2010-03-11 LAB — CONVERTED CEMR LAB
HDL: 46.5 mg/dL (ref >39.00)
VLDL: 30.4 mg/dL (ref 0.0–40.0)

## 2010-03-12 ENCOUNTER — Ambulatory Visit: Payer: Self-pay | Admitting: Cardiology

## 2010-03-12 LAB — CONVERTED CEMR LAB: POC INR: 2.6

## 2010-03-16 ENCOUNTER — Ambulatory Visit: Payer: Self-pay | Admitting: Family Medicine

## 2010-03-16 DIAGNOSIS — S72009A Fracture of unspecified part of neck of unspecified femur, initial encounter for closed fracture: Secondary | ICD-10-CM | POA: Insufficient documentation

## 2010-03-16 DIAGNOSIS — Z780 Asymptomatic menopausal state: Secondary | ICD-10-CM | POA: Insufficient documentation

## 2010-03-19 ENCOUNTER — Encounter: Payer: Self-pay | Admitting: Family Medicine

## 2010-03-20 ENCOUNTER — Encounter (INDEPENDENT_AMBULATORY_CARE_PROVIDER_SITE_OTHER): Payer: Self-pay | Admitting: *Deleted

## 2010-03-20 LAB — HM DIABETES EYE EXAM

## 2010-03-24 ENCOUNTER — Ambulatory Visit: Payer: Self-pay | Admitting: Family Medicine

## 2010-03-25 ENCOUNTER — Encounter: Payer: Self-pay | Admitting: Family Medicine

## 2010-03-25 ENCOUNTER — Encounter: Payer: Self-pay | Admitting: Cardiovascular Disease

## 2010-03-25 LAB — HM MAMMOGRAPHY: HM Mammogram: NORMAL

## 2010-03-30 ENCOUNTER — Encounter (INDEPENDENT_AMBULATORY_CARE_PROVIDER_SITE_OTHER): Payer: Self-pay | Admitting: *Deleted

## 2010-04-07 ENCOUNTER — Telehealth (INDEPENDENT_AMBULATORY_CARE_PROVIDER_SITE_OTHER): Payer: Self-pay | Admitting: *Deleted

## 2010-04-10 ENCOUNTER — Ambulatory Visit: Payer: Self-pay | Admitting: Cardiology

## 2010-05-07 ENCOUNTER — Ambulatory Visit: Payer: Self-pay | Admitting: Cardiology

## 2010-05-07 LAB — CONVERTED CEMR LAB: POC INR: 2.8

## 2010-06-02 ENCOUNTER — Encounter: Payer: Self-pay | Admitting: Cardiovascular Disease

## 2010-06-03 ENCOUNTER — Ambulatory Visit: Payer: Self-pay | Admitting: Cardiology

## 2010-06-03 ENCOUNTER — Encounter: Payer: Self-pay | Admitting: Cardiovascular Disease

## 2010-06-03 ENCOUNTER — Ambulatory Visit (HOSPITAL_COMMUNITY): Admission: RE | Admit: 2010-06-03 | Discharge: 2010-06-03 | Payer: Self-pay | Admitting: Cardiovascular Disease

## 2010-06-03 ENCOUNTER — Ambulatory Visit: Payer: Self-pay

## 2010-06-03 ENCOUNTER — Ambulatory Visit: Payer: Self-pay | Admitting: Cardiovascular Disease

## 2010-06-03 DIAGNOSIS — J441 Chronic obstructive pulmonary disease with (acute) exacerbation: Secondary | ICD-10-CM

## 2010-06-03 LAB — CONVERTED CEMR LAB: POC INR: 2.9

## 2010-06-05 ENCOUNTER — Telehealth: Payer: Self-pay | Admitting: Cardiovascular Disease

## 2010-06-24 ENCOUNTER — Ambulatory Visit: Payer: Self-pay | Admitting: Hematology & Oncology

## 2010-06-25 ENCOUNTER — Ambulatory Visit: Payer: Self-pay | Admitting: Family Medicine

## 2010-06-25 LAB — CBC WITH DIFFERENTIAL (CANCER CENTER ONLY)
BASO%: 0.4 % (ref 0.0–2.0)
HCT: 34.8 % (ref 34.8–46.6)
HGB: 11.6 g/dL (ref 11.6–15.9)
LYMPH#: 1.1 10*3/uL (ref 0.9–3.3)
MONO#: 0.3 10*3/uL (ref 0.1–0.9)
NEUT#: 4.6 10*3/uL (ref 1.5–6.5)
NEUT%: 71.3 % (ref 39.6–80.0)
RDW: 11.5 % (ref 10.5–14.6)
WBC: 6.4 10*3/uL (ref 3.9–10.0)

## 2010-06-25 LAB — FERRITIN: Ferritin: 1830 ng/mL — ABNORMAL HIGH (ref 10–291)

## 2010-06-25 LAB — RETICULOCYTES (CHCC)
ABS Retic: 63.9 10*3/uL (ref 19.0–186.0)
RBC.: 3.55 MIL/uL — ABNORMAL LOW (ref 3.87–5.11)

## 2010-06-25 LAB — HM DIABETES FOOT EXAM: HM Diabetic Foot Exam: NORMAL

## 2010-07-02 ENCOUNTER — Ambulatory Visit: Payer: Self-pay | Admitting: Internal Medicine

## 2010-07-02 ENCOUNTER — Ambulatory Visit: Payer: Self-pay | Admitting: Cardiovascular Disease

## 2010-07-07 LAB — CONVERTED CEMR LAB
BUN: 15 mg/dL (ref 6–23)
Chloride: 102 meq/L (ref 96–112)
Glucose, Bld: 105 mg/dL — ABNORMAL HIGH (ref 70–99)
Potassium: 4.1 meq/L (ref 3.5–5.1)
Sodium: 140 meq/L (ref 135–145)

## 2010-07-17 ENCOUNTER — Ambulatory Visit: Payer: Self-pay | Admitting: Cardiovascular Disease

## 2010-07-22 ENCOUNTER — Telehealth: Payer: Self-pay | Admitting: Cardiovascular Disease

## 2010-07-23 ENCOUNTER — Ambulatory Visit: Payer: Self-pay | Admitting: Cardiology

## 2010-07-23 LAB — CONVERTED CEMR LAB: POC INR: 2.8

## 2010-07-30 ENCOUNTER — Telehealth: Payer: Self-pay | Admitting: Cardiovascular Disease

## 2010-08-18 ENCOUNTER — Encounter: Payer: Self-pay | Admitting: Family Medicine

## 2010-08-20 ENCOUNTER — Ambulatory Visit: Payer: Self-pay | Admitting: Internal Medicine

## 2010-08-20 LAB — CONVERTED CEMR LAB: POC INR: 3

## 2010-08-25 ENCOUNTER — Encounter (INDEPENDENT_AMBULATORY_CARE_PROVIDER_SITE_OTHER): Payer: Self-pay | Admitting: *Deleted

## 2010-09-17 ENCOUNTER — Ambulatory Visit: Payer: Self-pay | Admitting: Cardiology

## 2010-09-29 ENCOUNTER — Ambulatory Visit: Payer: Self-pay | Admitting: Family Medicine

## 2010-10-01 ENCOUNTER — Telehealth: Payer: Self-pay | Admitting: Family Medicine

## 2010-10-01 LAB — CONVERTED CEMR LAB
Albumin: 4.7 g/dL (ref 3.5–5.2)
CO2: 27 meq/L (ref 19–32)
Calcium: 9.1 mg/dL (ref 8.4–10.5)
Cholesterol: 190 mg/dL (ref 0–200)
Creatinine, Ser: 1 mg/dL (ref 0.4–1.2)
GFR calc non Af Amer: 60.46 mL/min (ref >60)
Glucose, Bld: 129 mg/dL — ABNORMAL HIGH (ref 70–99)
HDL: 38.7 mg/dL — ABNORMAL LOW (ref >39.00)
Total Protein: 7.5 g/dL (ref 6.0–8.3)
Triglycerides: 222 mg/dL — ABNORMAL HIGH (ref 0.0–149.0)
VLDL: 44.4 mg/dL — ABNORMAL HIGH (ref 0.0–40.0)

## 2010-10-20 ENCOUNTER — Ambulatory Visit: Payer: Self-pay | Admitting: Hematology & Oncology

## 2010-10-20 ENCOUNTER — Ambulatory Visit: Payer: Self-pay

## 2010-10-20 ENCOUNTER — Ambulatory Visit (HOSPITAL_COMMUNITY)
Admission: RE | Admit: 2010-10-20 | Discharge: 2010-10-20 | Payer: Self-pay | Source: Home / Self Care | Admitting: Cardiovascular Disease

## 2010-10-20 ENCOUNTER — Ambulatory Visit: Payer: Self-pay | Admitting: Cardiovascular Disease

## 2010-10-20 ENCOUNTER — Ambulatory Visit: Payer: Self-pay | Admitting: Cardiology

## 2010-10-20 ENCOUNTER — Encounter: Payer: Self-pay | Admitting: Cardiovascular Disease

## 2010-10-22 ENCOUNTER — Telehealth: Payer: Self-pay | Admitting: Internal Medicine

## 2010-10-22 ENCOUNTER — Ambulatory Visit (HOSPITAL_BASED_OUTPATIENT_CLINIC_OR_DEPARTMENT_OTHER)
Admission: RE | Admit: 2010-10-22 | Discharge: 2010-10-22 | Payer: Self-pay | Source: Home / Self Care | Attending: Hematology & Oncology | Admitting: Hematology & Oncology

## 2010-10-22 ENCOUNTER — Encounter: Payer: Self-pay | Admitting: Cardiovascular Disease

## 2010-10-22 ENCOUNTER — Encounter (HOSPITAL_COMMUNITY)
Admission: RE | Admit: 2010-10-22 | Discharge: 2010-11-13 | Payer: Self-pay | Source: Home / Self Care | Attending: Hematology & Oncology | Admitting: Hematology & Oncology

## 2010-10-22 LAB — CHCC SATELLITE - SMEAR

## 2010-10-22 LAB — RETICULOCYTES (CHCC): ABS Retic: 59.9 10*3/uL (ref 19.0–186.0)

## 2010-10-22 LAB — CBC WITH DIFFERENTIAL (CANCER CENTER ONLY)
BASO%: 0.3 % (ref 0.0–2.0)
HCT: 30 % — ABNORMAL LOW (ref 34.8–46.6)
LYMPH%: 11.3 % — ABNORMAL LOW (ref 14.0–48.0)
MCH: 32.5 pg (ref 26.0–34.0)
MCHC: 33.9 g/dL (ref 32.0–36.0)
MCV: 96 fL (ref 81–101)
MONO#: 0.5 10*3/uL (ref 0.1–0.9)
MONO%: 4.9 % (ref 0.0–13.0)
NEUT%: 80.4 % — ABNORMAL HIGH (ref 39.6–80.0)
Platelets: 144 10*3/uL — ABNORMAL LOW (ref 145–400)
RDW: 11.7 % (ref 10.5–14.6)

## 2010-10-22 LAB — PROTIME-INR (CHCC SATELLITE): INR: 3.4 (ref 2.0–3.5)

## 2010-10-22 LAB — CMP (CANCER CENTER ONLY)
Albumin: 4.1 g/dL (ref 3.3–5.5)
Alkaline Phosphatase: 64 U/L (ref 26–84)
BUN, Bld: 16 mg/dL (ref 7–22)
Creat: 1.1 mg/dl (ref 0.6–1.2)
Glucose, Bld: 221 mg/dL — ABNORMAL HIGH (ref 73–118)
Potassium: 3.8 mEq/L (ref 3.3–4.7)

## 2010-10-23 LAB — CBC WITH DIFFERENTIAL (CANCER CENTER ONLY)
BASO%: 0.3 % (ref 0.0–2.0)
EOS%: 3.1 % (ref 0.0–7.0)
HGB: 9.3 g/dL — ABNORMAL LOW (ref 11.6–15.9)
LYMPH#: 0.8 10*3/uL — ABNORMAL LOW (ref 0.9–3.3)
MCHC: 33 g/dL (ref 32.0–36.0)
NEUT#: 6.5 10*3/uL (ref 1.5–6.5)
RDW: 12 % (ref 10.5–14.6)

## 2010-10-23 LAB — PROTIME-INR (CHCC SATELLITE): INR: 2.5 (ref 2.0–3.5)

## 2010-10-26 ENCOUNTER — Ambulatory Visit: Payer: Self-pay | Admitting: Cardiology

## 2010-10-26 LAB — CONVERTED CEMR LAB
Basophils Absolute: 0 10*3/uL (ref 0.0–0.1)
Basophils Relative: 0.3 % (ref 0.0–3.0)
Eosinophils Absolute: 0.2 10*3/uL (ref 0.0–0.7)
HCT: 26.5 % — ABNORMAL LOW (ref 36.0–46.0)
Hemoglobin: 9.3 g/dL — ABNORMAL LOW (ref 12.0–15.0)
Lymphocytes Relative: 13.1 % (ref 12.0–46.0)
Lymphs Abs: 1 10*3/uL (ref 0.7–4.0)
MCHC: 35 g/dL (ref 30.0–36.0)
MCV: 97.5 fL (ref 78.0–100.0)
Monocytes Absolute: 0.4 10*3/uL (ref 0.1–1.0)
Neutro Abs: 6.1 10*3/uL (ref 1.4–7.7)
RBC: 2.71 M/uL — ABNORMAL LOW (ref 3.87–5.11)
RDW: 14.2 % (ref 11.5–14.6)

## 2010-10-28 LAB — CBC WITH DIFFERENTIAL (CANCER CENTER ONLY)
BASO%: 0.3 % (ref 0.0–2.0)
EOS%: 3.2 % (ref 0.0–7.0)
LYMPH#: 0.8 10*3/uL — ABNORMAL LOW (ref 0.9–3.3)
MCHC: 33.6 g/dL (ref 32.0–36.0)
NEUT#: 4.6 10*3/uL (ref 1.5–6.5)
RDW: 11.4 % (ref 10.5–14.6)

## 2010-10-28 LAB — PROTIME-INR (CHCC SATELLITE): INR: 2.3 (ref 2.0–3.5)

## 2010-11-04 ENCOUNTER — Ambulatory Visit: Payer: Self-pay | Admitting: Cardiovascular Disease

## 2010-11-04 ENCOUNTER — Ambulatory Visit: Payer: Self-pay | Admitting: Cardiology

## 2010-11-04 ENCOUNTER — Encounter: Payer: Self-pay | Admitting: Cardiovascular Disease

## 2010-11-12 ENCOUNTER — Ambulatory Visit: Payer: Self-pay | Admitting: Cardiovascular Disease

## 2010-11-12 LAB — CONVERTED CEMR LAB

## 2010-11-17 LAB — CONVERTED CEMR LAB: Hemoglobin: 10.7 g/dL — ABNORMAL LOW (ref 12.0–15.0)

## 2010-11-18 ENCOUNTER — Encounter: Payer: Self-pay | Admitting: Cardiovascular Disease

## 2010-11-18 ENCOUNTER — Encounter: Payer: Self-pay | Admitting: Family Medicine

## 2010-11-26 ENCOUNTER — Ambulatory Visit: Admission: RE | Admit: 2010-11-26 | Discharge: 2010-11-26 | Payer: Self-pay | Source: Home / Self Care

## 2010-11-26 LAB — CONVERTED CEMR LAB: POC INR: 2.4

## 2010-12-11 ENCOUNTER — Encounter: Payer: Self-pay | Admitting: Cardiovascular Disease

## 2010-12-13 LAB — CONVERTED CEMR LAB
BUN: 16 mg/dL (ref 6–23)
Basophils Relative: 0.5 % (ref 0.0–3.0)
Chloride: 109 meq/L (ref 96–112)
Eosinophils Relative: 4.3 % (ref 0.0–5.0)
Glucose, Bld: 84 mg/dL (ref 70–99)
Hemoglobin: 10.5 g/dL — ABNORMAL LOW (ref 12.0–15.0)
Lymphocytes Relative: 15.1 % (ref 12.0–46.0)
Monocytes Relative: 6.6 % (ref 3.0–12.0)
Neutro Abs: 4.7 10*3/uL (ref 1.4–7.7)
Potassium: 4.3 meq/L (ref 3.5–5.1)
Prothrombin Time: 22.2 s
RBC: 3.17 M/uL — ABNORMAL LOW (ref 3.87–5.11)
WBC: 6.4 10*3/uL (ref 4.5–10.5)

## 2010-12-14 ENCOUNTER — Telehealth (INDEPENDENT_AMBULATORY_CARE_PROVIDER_SITE_OTHER): Payer: Self-pay | Admitting: *Deleted

## 2010-12-15 ENCOUNTER — Ambulatory Visit
Admission: RE | Admit: 2010-12-15 | Discharge: 2010-12-15 | Payer: Self-pay | Source: Home / Self Care | Attending: Cardiovascular Disease | Admitting: Cardiovascular Disease

## 2010-12-15 ENCOUNTER — Ambulatory Visit: Admission: RE | Admit: 2010-12-15 | Discharge: 2010-12-15 | Payer: Self-pay | Source: Home / Self Care

## 2010-12-15 ENCOUNTER — Encounter: Payer: Self-pay | Admitting: Cardiovascular Disease

## 2010-12-16 ENCOUNTER — Encounter: Payer: Self-pay | Admitting: Physical Therapy

## 2010-12-16 ENCOUNTER — Ambulatory Visit: Payer: Medicare PPO | Attending: Orthopedic Surgery | Admitting: Physical Therapy

## 2010-12-16 DIAGNOSIS — M25559 Pain in unspecified hip: Secondary | ICD-10-CM | POA: Insufficient documentation

## 2010-12-16 DIAGNOSIS — IMO0001 Reserved for inherently not codable concepts without codable children: Secondary | ICD-10-CM | POA: Insufficient documentation

## 2010-12-16 DIAGNOSIS — R262 Difficulty in walking, not elsewhere classified: Secondary | ICD-10-CM | POA: Insufficient documentation

## 2010-12-17 ENCOUNTER — Encounter: Payer: Self-pay | Admitting: Cardiovascular Disease

## 2010-12-17 NOTE — Medication Information (Signed)
Summary: rov/tm  Anticoagulant Therapy  Managed by: Bethena Midget, RN, BSN Referring MD: Charlton Haws MD PCP: Neena Rhymes MD Supervising MD: Ladona Ridgel MD, Sharlot Gowda Indication 1: Mitral Valve Replacement (ICD-V43.3) Indication 2: TIA (ICD-435.9) Lab Used: [facility] [place] Site: [address] INR RANGE 2.5 - 3.5  Dietary changes: no    Health status changes: no    Bleeding/hemorrhagic complications: yes       Details: bruise on LLE at site of PT  Recent/future hospitalizations: no    Any changes in medication regimen? yes       Details: will change to warfarin in next month  Recent/future dental: no  Any missed doses?: no       Is patient compliant with meds? yes       Allergies (verified): 1)  ! Bactrim (Sulfamethoxazole-Trimethoprim) 2)  ! Demerol (Meperidine Hcl) 3)  ! Heparin  Anticoagulation Management History:      The patient is taking warfarin and comes in today for a routine follow up visit.  Positive risk factors for bleeding include an age of 71 years or older, history of CVA/TIA, and presence of serious comorbidities.  The bleeding index is 'high risk'.  Positive CHADS2 values include History of CHF, History of HTN, Age > 41 years old, History of Diabetes, and Prior Stroke/CVA/TIA.  The start date was 08/21/2001.  Her last INR was 4.9 RATIO.  Anticoagulation responsible provider: Ladona Ridgel MD, Sharlot Gowda.  Cuvette Lot#: [id number].  Exp: 02/2011.    Anticoagulation Management Assessment/Plan:      The patient's current anticoagulation dose is Warfarin sodium 2.5 mg tabs: Use as directed by Anticoagualtion Clinic.  The target INR is 2.5 - 3.5.  The next INR is due 12/23/2009.  Anticoagulation instructions were given to patient.  Results were reviewed/authorized by Bethena Midget, RN, BSN.  She was notified by Shelby Dubin PharmD, BCPS, CPP.         Prior Anticoagulation Instructions: INR 2.5  Continue 1.5 tablets everyday except on Wednesdays take 1 tablet.    Recheck in 4 weeks.   Current Anticoagulation Instructions: INR 4.1  Skip 1 day then 1 tab each Wednesday and Saturday and 1.5 tabs on all other days.  Recheck in 3 weeks.

## 2010-12-17 NOTE — Medication Information (Signed)
Summary: rov    js  Anticoagulant Therapy  Managed by: [name], PharmD Referring MD: Charlton Haws MD PCP: Neena Rhymes MD Supervising MD: Shirlee Latch MD, Freida Busman Indication 1: Mitral Valve Replacement (ICD-V43.3) Indication 2: TIA (ICD-435.9) Lab Used: [facility]  Site: [address] INR POC 2.8 INR RANGE 2.5 - 3.5  Dietary changes: no    Health status changes: no    Bleeding/hemorrhagic complications: no    Recent/future hospitalizations: no    Any changes in medication regimen? no    Recent/future dental: no  Any missed doses?: no       Is patient compliant with meds? yes       Allergies: 1)  ! Bactrim (Sulfamethoxazole-Trimethoprim) 2)  ! Demerol (Meperidine Hcl) 3)  ! Heparin  Anticoagulation Management History:      The patient is taking warfarin and comes in today for a routine follow up visit.  Positive risk factors for bleeding include an age of 75 years or older, history of CVA/TIA, and presence of serious comorbidities.  The bleeding index is 'high risk'.  Positive CHADS2 values include History of CHF, History of HTN, Age > 71 years old, History of Diabetes, and Prior Stroke/CVA/TIA.  The start date was 08/21/2001.  Her last INR was 4.9 RATIO.  Anticoagulation responsible provider: Shirlee Latch MD, Wylder Macomber.  INR POC: 2.8.  Cuvette Lot#: [id number].  Exp: 07/2011.    Anticoagulation Management Assessment/Plan:      The patient's current anticoagulation dose is Warfarin sodium 2.5 mg tabs: Use as directed by Anticoagualtion Clinic.  The target INR is 2.5 - 3.5.  The next INR is due 06/03/2010.  Anticoagulation instructions were given to patient.  Results were reviewed/authorized by [name], PharmD.  She was notified by [name] PharmD.         Prior Anticoagulation Instructions: The patient is to continue with the same dose of coumadin.  This dosage includes: 1 1/2 tablets every day except one tablet on Wednesdays and Saturdays.  Current  Anticoagulation Instructions: INR 2.8  Continue same dose of 1 1/2 tablets every day except 1 tablet on Wednesday and Saturday.

## 2010-12-17 NOTE — Medication Information (Signed)
Summary: rov/sp  Anticoagulant Therapy  Managed by: Eda Keys, PharmD Referring MD: Charlton Haws MD PCP: Neena Rhymes MD Supervising MD: Jens Som MD, Arlys John Indication 1: Mitral Valve Replacement (ICD-V43.3) Indication 2: TIA (ICD-435.9) Lab Used: [facility] [place] Site: [address] INR POC 2.8 INR RANGE 2.5 - 3.5  Dietary changes: no    Health status changes: no    Bleeding/hemorrhagic complications: no    Recent/future hospitalizations: no    Any changes in medication regimen? yes       Details: Dr. Eden Emms gave her new Rx for Revatio but hasn't actually started taking it yet - pending insurance approval  Recent/future dental: no  Any missed doses?: no       Is patient compliant with meds? yes       Allergies: 1)  ! Bactrim (Sulfamethoxazole-Trimethoprim) 2)  ! Demerol (Meperidine Hcl) 3)  ! Heparin  Anticoagulation Management History:      The patient is taking warfarin and comes in today for a routine follow up visit.  Positive risk factors for bleeding include an age of 75 years or older, history of CVA/TIA, and presence of serious comorbidities.  The bleeding index is 'high risk'.  Positive CHADS2 values include History of CHF, History of HTN, Age > 52 years old, History of Diabetes, and Prior Stroke/CVA/TIA.  The start date was 08/21/2001.  Her last INR was 4.9 RATIO.  Anticoagulation responsible provider: Jens Som MD, Arlys John.  INR POC: 2.8.  Cuvette Lot#: [id number].  Exp: 08/2011.    Anticoagulation Management Assessment/Plan:      The patient's current anticoagulation dose is Warfarin sodium 2.5 mg tabs: Use as directed by Anticoagualtion Clinic.  The target INR is 2.5 - 3.5.  The next INR is due 08/20/2010.  Anticoagulation instructions were given to patient.  Results were reviewed/authorized by Eda Keys, PharmD.  She was notified by Harrel Carina, PharmD candidate.         Prior Anticoagulation Instructions: INR 3.8  Skip  today's dose, then resume 1.5 tablets daily except 1 tablet Wed and Sat.  Return to clinic in 3 weeks.  Current Anticoagulation Instructions: INR 2.8  Continue taking 1 1/2 tablets every day except take 1 tablet on Wednesdays and Saturdays. Re-check INR in 4 weeks.

## 2010-12-17 NOTE — Assessment & Plan Note (Signed)
Summary: follow-up on blood work//lch   Vital Signs:  Patient profile:   77 year old female Weight:      176 pounds Pulse rate:   78 / minute BP sitting:   100 / 70  (left arm)  Vitals Entered By: Doristine Devoid (Mar 16, 2010 1:31 PM) CC: roa    History of Present Illness: 77 yo woman here today for f/u on   1) DM- last A1C was 6.5.  CBG 111 this AM.  no symptomatic lows.  has been taking glyburide 1 in AM, 1/2 in PM.  no CP, SOB more than usual, edema more than usual, abd pain, HAs, visual changes.  2) Cholesterol- labs reviewed.  at goal.  no changes.  tolerating statin w/out N/V, GI upset, myalgias.  3) Vit D Deficiency- pt overdue for bone density.  Problems Prior to Update: 1)  Hip Fracture, Left  (ICD-820.8) 2)  Postmenopausal Status  (ICD-V49.81) 3)  Vitamin D Deficiency  (ICD-268.9) 4)  Hip Pain  (ICD-719.45) 5)  Low Blood Pressure  (ICD-458.9) 6)  Weight Loss  (ICD-783.21) 7)  Fatigue  (ICD-780.79) 8)  Uns Advrs Eff Uns Rx Medicinal&biological Sbstnc  (ICD-995.20) 9)  Anemia  (ICD-285.9) 10)  Carotid Occlusive Disease  (ICD-433.10) 11)  Diab W/unspec Comp Type Ii/unspec Type Uncntrl  (ICD-250.92) 12)  Hypercholesterolemia  (ICD-272.0) 13)  Essential Hypertension, Benign  (ICD-401.1) 14)  Tia  (ICD-435.9) 15)  Chronic Diastolic Heart Failure  (ICD-428.32) 16)  Atrial Fibrillation  (ICD-427.31) 17)  Mitral Valve Replacement, Hx of  (ICD-V15.1)  Current Medications (verified): 1)  Lasix 80 Mg Tabs (Furosemide) .Marland Kitchen.. 1 Tab Bid 2)  Aspirin Adult Low Strength 81 Mg Tbec (Aspirin) .Marland Kitchen.. 1 Tab By Mouth Once Daily 3)  Folic Acid 800 Mcg Tabs (Folic Acid) .... Take One Tablet Daily 4)  Lipitor 20 Mg Tabs (Atorvastatin Calcium) .... Take One Tablet At Bedtime 5)  Ferrous Sulfate 325 (65 Fe) Mg Tabs (Ferrous Sulfate) .... Take Two Times A Day 6)  Aranesp (Albumin Free) 60 Mcg/ml Soln (Darbepoetin Alfa-Polysorbate) .... As Needed 7)  Imdur 30 Mg Xr24h-Tab (Isosorbide  Mononitrate) .Marland Kitchen.. 1 Tab Daily 8)  Colace 100 Mg Caps (Docusate Sodium) .Marland Kitchen.. 1 Tab Two Times A Day 9)  Glyburide 5 Mg Tabs (Glyburide) .... Take One Tablet Two Times A Day 10)  Coreg 25 Mg Tabs (Carvedilol) .... Take One Tablet Two Times A Day 11)  Klor-Con M10 10 Meq Cr-Tabs (Potassium Chloride Crys Cr) .... 5 Tablets Two Times A Day 12)  Warfarin Sodium 2.5 Mg Tabs (Warfarin Sodium) .... Use As Directed By Anticoagualtion Clinic 13)  Robaxin 500 Mg Tabs (Methocarbamol) .Marland Kitchen.. 1 Tab Two Times A Day 14)  Vicodin 5-500 Mg Tabs (Hydrocodone-Acetaminophen) .... Take One Tablet Three Times A Day As Needed  Allergies (verified): 1)  ! Bactrim (Sulfamethoxazole-Trimethoprim) 2)  ! Demerol (Meperidine Hcl) 3)  ! Heparin  Past History:  Past Medical History: Last updated: 10/07/2008 PAF/Flutter:  EPS procedure 2003 aborted ablation with left side flutter MVR [facility] [id number] CHF diastolic Hypertension PUlmonary hypertension Arthritis TIA Diabetes Hypercholesterolemia Anemia CRF  Review of Systems      See HPI  Physical Exam  General:  Well-developed,well-nourished,in no acute distress; alert,appropriate and cooperative throughout examination Head:  Normocephalic and atraumatic without obvious abnormalities. Lungs:  Normal respiratory effort, chest expands symmetrically. Lungs are clear to auscultation, no crackles or wheezes. Heart:  reg S1/S2, mitral click Abdomen:  soft, NT/ND, +BS Pulses:  +2 radial,  carotid, DP Extremities:  no C/C/E  Diabetes Management Exam:    Foot Exam (with socks and/or shoes not present):       Sensory-Pinprick/Light touch:          Left medial foot (L-4): normal          Left dorsal foot (L-5): diminished          Left lateral foot (S-1): normal          Right medial foot (L-4): normal          Right dorsal foot (L-5): normal          Right lateral foot (S-1): normal       Sensory-Monofilament:          Left foot: diminished          Right foot:  normal       Inspection:          Left foot: normal          Right foot: normal       Nails:          Left foot: normal          Right foot: normal   Impression & Recommendations:  Problem # 1:  DIAB W/UNSPEC COMP TYPE II/UNSPEC TYPE UNCNTRL (ICD-250.92) Assessment Unchanged due for A1C.  pt has decreased PM meds to 1/2 tab due to AM lows.   Her updated medication list for this problem includes:    Aspirin Adult Low Strength 81 Mg Tbec (Aspirin) .Marland Kitchen... 1 tab by mouth once daily    Glyburide 5 Mg Tabs (Glyburide) .Marland Kitchen... Take one tablet two times a day  Orders: Venipuncture (14782) TLB-A1C / Hgb A1C (Glycohemoglobin) (83036-A1C)  Problem # 2:  HYPERCHOLESTEROLEMIA (ICD-272.0) Assessment: Unchanged lipids at goal- no changes at this time Her updated medication list for this problem includes:    Lipitor 20 Mg Tabs (Atorvastatin calcium) .Marland Kitchen... Take one tablet at bedtime  Problem # 3:  VITAMIN D DEFICIENCY (ICD-268.9) Assessment: Unchanged pt due for bone density. Orders: Radiology Referral (Radiology)  Complete Medication List: 1)  Lasix 80 Mg Tabs (Furosemide) .Marland Kitchen.. 1 tab bid 2)  Aspirin Adult Low Strength 81 Mg Tbec (Aspirin) .Marland Kitchen.. 1 tab by mouth once daily 3)  Folic Acid 800 Mcg Tabs (Folic acid) .... Take one tablet daily 4)  Lipitor 20 Mg Tabs (Atorvastatin calcium) .... Take one tablet at bedtime 5)  Ferrous Sulfate 325 (65 Fe) Mg Tabs (Ferrous sulfate) .... Take two times a day 6)  Aranesp (albumin Free) 60 Mcg/ml Soln (Darbepoetin alfa-polysorbate) .... As needed 7)  Imdur 30 Mg Xr24h-tab (Isosorbide mononitrate) .Marland Kitchen.. 1 tab daily 8)  Colace 100 Mg Caps (Docusate sodium) .Marland Kitchen.. 1 tab two times a day 9)  Glyburide 5 Mg Tabs (Glyburide) .... Take one tablet two times a day 10)  Coreg 25 Mg Tabs (Carvedilol) .... Take one tablet two times a day 11)  Klor-con M10 10 Meq Cr-tabs (Potassium chloride crys cr) .... 5 tablets two times a day 12)  Warfarin Sodium 2.5 Mg Tabs (Warfarin  sodium) .... Use as directed by anticoagualtion clinic 13)  Robaxin 500 Mg Tabs (Methocarbamol) .Marland Kitchen.. 1 tab two times a day 14)  Vicodin 5-500 Mg Tabs (Hydrocodone-acetaminophen) .... Take one tablet three times a day as needed  Patient Instructions: 1)  Please schedule a follow-up appointment in 3 months for your diabetes. 2)  We'll notify you of your lab results 3)  Keep up the  good work- you look great! 4)  We'll call you with your bone density appt 5)  Call with any questions or concerns 6)  Have a great summer!

## 2010-12-17 NOTE — Letter (Signed)
Summary: Primary Care Appointment Letter  [facility] [place]  [address] [place], [place] [address]   Phone: [phone]  Fax: [phone]    02/26/2010 MRN: [id number]  [facility] [address] [place], [place]  [address]  Dear Ms. ROES,   Your Primary Care Physician Neena Rhymes MD has indicated that:    ____X___it is time to schedule an appointment for Diabetes and Cholesterol follow-up due in May w/ Dr. Beverely Low    _______you missed your appointment on______ and need to call and          reschedule.    _______you need to have lab work done.    _______you need to schedule an appointment discuss lab or test results.    _______you need to call to reschedule your appointment that is                       scheduled on _________.     Please call our office as soon as possible. Our phone number is [phone]___. Our office is open 8a-5p, Monday through Friday.     Thank you,    [facility]uler

## 2010-12-17 NOTE — Miscellaneous (Signed)
Summary: Orders Update  Clinical Lists Changes  Orders: Added new Test order of Carotid Duplex (Carotid Duplex) - Signed 

## 2010-12-17 NOTE — Progress Notes (Signed)
Summary: fell and hit head- going to er  Phone Note Call from Patient Call back at Home Phone [phone]   Caller: Daughter-Beth Summary of Call: Patient daughter mom fell this morning and hit her head has a knot that was doing some bleeding patient is on coumadin and recently noted some nausea when I ask how she was feeling. Informed that she needs to go to emergency room to be evaluated to make sure there isn't any other underlying bleeding daughter agreed with recommendation..............Marland KitchenDoristine Devoid  February 02, 2010 9:08 AM

## 2010-12-17 NOTE — Progress Notes (Signed)
Summary: bone density results  Phone Note Outgoing Call   Call placed by: Doristine Devoid,  Apr 07, 2010 1:32 PM Summary of Call: pt is osteopenic- needs to start Fosamax 35mg  weekly for prevention of osteoporosis.  should add Ca+D supplement daily if not already taking.  Follow-up for Phone Call        spoke w/ patient aware of bone density results and to start fosamax also informed patient that once she finishes the once a week vitamin d then she needs to take caltrate otc two times a day ............Marland KitchenDoristine Devoid  Apr 07, 2010 1:32 PM      Appended Document: bone density results    Clinical Lists Changes  Medications: Added new medication of FOSAMAX 35 MG TABS (ALENDRONATE SODIUM) take one tablet weekly - Signed Rx of FOSAMAX 35 MG TABS (ALENDRONATE SODIUM) take one tablet weekly;  #4 x 12;  Signed;  Entered by: Doristine Devoid;  Authorized by: Neena Rhymes MD;  Method used: Electronically to [facility] [phone]*, [address], [place], [place]  [address], Ph: [phone], Fax: [phone]    Prescriptions: FOSAMAX 35 MG TABS (ALENDRONATE SODIUM) take one tablet weekly  #4 x 12   Entered by:   Doristine Devoid   Authorized by:   Neena Rhymes MD   Signed by:   Doristine Devoid on 04/07/2010   Method used:   Electronically to        [facility] [phone]* (retail)       [address]       [place], [place]  [address]       Ph: [phone]       Fax: [phone]   RxID:   [id number]

## 2010-12-17 NOTE — Medication Information (Signed)
Summary: rov/sp  Anticoagulant Therapy  Managed by: Bethena Midget, RN, BSN Referring MD: Charlton Haws MD PCP: Neena Rhymes MD Supervising MD: Juanda Chance MD, Orlandis Sanden Indication 1: Mitral Valve Replacement (ICD-V43.3) Indication 2: TIA (ICD-435.9) Lab Used: [facility] [place] Site: [address] INR POC 2.3 INR RANGE 2.5 - 3.5  Dietary changes: no    Health status changes: yes       Details: See below  Bleeding/hemorrhagic complications: yes       Details: Large hematoma and bruise down the entire Rt arm to wrist  Recent/future hospitalizations: no    Any changes in medication regimen? yes       Details: Started on Keflex for redness and warmth in the arm  Recent/future dental: no  Any missed doses?: yes     Details: on Thurs and Fri only took 2.5mg s per MD instructions  Is patient compliant with meds? yes      Comments: Pt states she was getting out of bed on Wed and felt a pop in her Rt arm. Went to Hemo/[facility] MD on Thurs. told him of the issue and he gave FFP and called Dr Graciela Husbands and coumadin was adjusted for 2 days. Dr Juanda Chance saw arm today and wants INR keep 2.0-2.5 til she see Dr Eden Emms next week.   Allergies: 1)  ! Bactrim (Sulfamethoxazole-Trimethoprim) 2)  ! Demerol (Meperidine Hcl) 3)  ! Heparin  Anticoagulation Management History:      The patient is taking warfarin and comes in today for a routine follow up visit.  Positive risk factors for bleeding include an age of 75 years or older, history of CVA/TIA, and presence of serious comorbidities.  The bleeding index is 'high risk'.  Positive CHADS2 values include History of CHF, History of HTN, Age > 42 years old, History of Diabetes, and Prior Stroke/CVA/TIA.  The start date was 08/21/2001.  Her last INR was 4.9 RATIO.  Anticoagulation responsible provider: Juanda Chance MD, Smitty Cords.  INR POC: 2.3.  Cuvette Lot#: [id number].  Exp: 11/2011.    Anticoagulation Management Assessment/Plan:      The patient's current  anticoagulation dose is Warfarin sodium 2.5 mg tabs: Use as directed by Anticoagualtion Clinic.  The target INR is 2.5 - 3.5.  The next INR is due 11/04/2010.  Anticoagulation instructions were given to patient.  Results were reviewed/authorized by Bethena Midget, RN, BSN.  She was notified by Bethena Midget, RN, BSN.         Prior Anticoagulation Instructions: INR 3.2  Continue same dose of 1 1/2 tablets every day except 1 tablet on Wednesday and Saturday.  Recheck INR in 4 weeks.   Current Anticoagulation Instructions: INR 2.3 Change dose temporary to 1.5 pills everyday except 1 pill on Mondays, Wednesdays and Saturdays. Recheck in one week.

## 2010-12-17 NOTE — Letter (Signed)
Summary: [facility]  [facility]   Imported By: Lennie Odor 11/13/2010 12:22:49  _____________________________________________________________________  External Attachment:    Type:   Image     Comment:   External Document

## 2010-12-17 NOTE — Medication Information (Signed)
Summary: rov coumadin - [facility]  Anticoagulant Therapy  Managed by: Bethena Midget, RN, BSN Referring MD: Charlton Haws MD PCP: Neena Rhymes MD Supervising MD: Eden Emms MD, Theron Arista Indication 1: Mitral Valve Replacement (ICD-V43.3) Indication 2: TIA (ICD-435.9) Lab Used: [facility] [place] Site: [address] INR POC 2.0 INR RANGE 2.5-3.5  Dietary changes: no    Health status changes: yes       Details: Rt forearm much improved. Bruising almost completed resolved.   Bleeding/hemorrhagic complications: no    Recent/future hospitalizations: no    Any changes in medication regimen? no    Recent/future dental: no  Any missed doses?: no       Is patient compliant with meds? yes       Allergies: 1)  ! Bactrim (Sulfamethoxazole-Trimethoprim) 2)  ! Demerol (Meperidine Hcl) 3)  ! Heparin  Anticoagulation Management History:      The patient is taking warfarin and comes in today for a routine follow up visit.  Positive risk factors for bleeding include an age of 75 years or older, history of CVA/TIA, and presence of serious comorbidities.  The bleeding index is 'high risk'.  Positive CHADS2 values include History of CHF, History of HTN, Age > 75 years old, History of Diabetes, and Prior Stroke/CVA/TIA.  The start date was 08/21/2001.  Her last INR was 4.9 RATIO.  Anticoagulation responsible provider: Eden Emms MD, Theron Arista.  INR POC: 2.0.  Cuvette Lot#: [id number].  Exp: 12/2011.    Anticoagulation Management Assessment/Plan:      The patient's current anticoagulation dose is Warfarin sodium 2.5 mg tabs: Use as directed by Anticoagualtion Clinic.  The target INR is 2.5 - 3.5.  The next INR is due 11/25/2010.  Anticoagulation instructions were given to patient.  Results were reviewed/authorized by Bethena Midget, RN, BSN.  She was notified by Bethena Midget, RN, BSN.         Prior Anticoagulation Instructions: INR 3.0  NO COUMADIN TODAY 12/21  Coumadin 2.5mg  tabs - take 1.5 tab on  MON, WED, FRI and 1 tab all other days  Current Anticoagulation Instructions: INR 2.0 Resume 3.75mg s daily except 2.5mg s on Wednesdays and Saturdays to get her back to normal range 2.5-3.5.

## 2010-12-17 NOTE — Progress Notes (Signed)
Summary: PT REQUEST CALL  Phone Note Call from Patient Call back at Home Phone [phone]   Caller: Patient Summary of Call: PT REQUEST CALL Initial call taken by: Judie Grieve,  July 22, 2010 11:01 AM  Follow-up for Phone Call        spoke with pt, called [employer] for prior auth. for reviato. will hear back within 24 hours. [name], RN  July 22, 2010 2:24 PM

## 2010-12-17 NOTE — Assessment & Plan Note (Signed)
Summary: 3 MONTH ROV ECHO AT 9:30/SL   Primary Provider:  Neena Rhymes MD   History of Present Illness: Kelsey Cochran has a history of mechanical mitral valve replacement, TV repair   LIMA to LAD and  maze procedure. This was done at [facility] in 2005 by Dr Silvestre Mesi.   She has switched  to generic warfarin.  I told her this would be fine but we may have to follow her INR more closely.  Her INR was a bit high  She knows to take SBE prophylaxis and will see the dentist next week.  Is not injected chest pain palpitations PND or orthopnea.  No bleeding diathesis.  She has known carotid disease on the right-hand side.  We thought it was fairly significant but CT angiography graded it at only 35%.  She had an evaluation by Dr. Edilia Bo who did not feel surgery was indicated. Her duplex was reviewed and showed stable 60-79% RICA disease.   She has been having some exertional dyspnea but no overt signs of failure  Denies fever cough sputum PND or othopnea  He last echo showed elevate PA pressures in the 85 mmHg range.  She has had long standing pulmonary hypertension that preceeded her MVR/TVR.   We tried to put her on Revatio but she did not tolerate it with headaches, presyncope and nasal congestion  Reviewed her echo from today and normal LV, normal MVR function and continued moderate pulmonary hypertension peak TR velocity 3.3 cm/sec.     Current Problems (verified): 1)  Dyspnea  (ICD-786.05) 2)  Hip Fracture, Left  (ICD-820.8) 3)  Postmenopausal Status  (ICD-V49.81) 4)  Vitamin D Deficiency  (ICD-268.9) 5)  Hip Pain  (ICD-719.45) 6)  Low Blood Pressure  (ICD-458.9) 7)  Weight Loss  (ICD-783.21) 8)  Fatigue  (ICD-780.79) 9)  Uns Advrs Eff Uns Rx Medicinal&biological Sbstnc  (ICD-995.20) 10)  Anemia  (ICD-285.9) 11)  Carotid Occlusive Disease  (ICD-433.10) 12)  Diab W/unspec Comp Type Ii/unspec Type Uncntrl  (ICD-250.92) 13)  Hypercholesterolemia  (ICD-272.0) 14)  Essential Hypertension, Benign   (ICD-401.1) 15)  Tia  (ICD-435.9) 16)  Chronic Diastolic Heart Failure  (ICD-428.32) 17)  Atrial Fibrillation  (ICD-427.31) 18)  Mitral Valve Replacement, Hx of  (ICD-V15.1)  Current Medications (verified): 1)  Lasix 80 Mg Tabs (Furosemide) .Marland Kitchen.. 1 Tab Bid 2)  Aspirin Adult Low Strength 81 Mg Tbec (Aspirin) .Marland Kitchen.. 1 Tab By Mouth Once Daily 3)  Folic Acid 800 Mcg Tabs (Folic Acid) .... Take One Tablet Daily 4)  Lipitor 20 Mg Tabs (Atorvastatin Calcium) .... Take One Tablet At Bedtime 5)  Ferrous Sulfate 325 (65 Fe) Mg Tabs (Ferrous Sulfate) .... Take Two Times A Day 6)  Aranesp (Albumin Free) 60 Mcg/ml Soln (Darbepoetin Alfa-Polysorbate) .... As Needed 7)  Imdur 30 Mg Xr24h-Tab (Isosorbide Mononitrate) .Marland Kitchen.. 1 Tab Daily 8)  Colace 100 Mg Caps (Docusate Sodium) .Marland Kitchen.. 1 Tab Two Times A Day 9)  Glyburide 5 Mg Tabs (Glyburide) .Marland Kitchen.. 1 Tab Am and 1 Tab Pm 10)  Coreg 25 Mg Tabs (Carvedilol) .... Take One Tablet Two Times A Day 11)  Warfarin Sodium 2.5 Mg Tabs (Warfarin Sodium) .... Use As Directed By Anticoagualtion Clinic 12)  Robaxin 500 Mg Tabs (Methocarbamol) .... As Needed 13)  Vicodin 5-500 Mg Tabs (Hydrocodone-Acetaminophen) .... Take One Tablet Three Times A Day As Needed 14)  Fosamax 35 Mg Tabs (Alendronate Sodium) .... Take One Tablet Weekly 15)  Es Tylenol .... As Needed 16)  Tylenol Pm .Marland KitchenMarland KitchenMarland Kitchen  As Needed 17)  Aldactone 25 Mg Tabs (Spironolactone) .Marland Kitchen.. 1 Once Daily 18)  Truetest Test  Strp (Glucose Blood) .... Test Once Daily  Allergies (verified): 1)  ! Bactrim (Sulfamethoxazole-Trimethoprim) 2)  ! Demerol (Meperidine Hcl) 3)  ! Heparin  Past History:  Past Medical History: Last updated: 10/07/2008 PAF/Flutter:  EPS procedure 2003 aborted ablation with left side flutter MVR Cox [id number] CHF diastolic Hypertension PUlmonary hypertension Arthritis TIA Diabetes Hypercholesterolemia Anemia CRF  Past Surgical History: Last updated: 06/11/2009 Arthroscopy:  07/06/2007 right knee  [place] MVR with TV annuloplasty Glower Duke 04/2004 Rotator Cuff: 02/24/2006 Giofre Hysterectomy L hip fx and repair 6/10  Family History: Last updated: 09/11/2008 Negative FH of Diabetes, Hypertension, or Coronary Artery Disease  Social History: Last updated: 10/28/2008 Widowed  3 Children daughter with Lupus Non smoker non drinker  Review of Systems       Denies fever, malais, weight loss, blurry vision, decreased visual acuity, cough, sputum,  hemoptysis, pleuritic pain, palpitaitons, heartburn, abdominal pain, melena, lower extremity edema, claudication, or rash.   Vital Signs:  Patient profile:   77 year old female Height:      65 inches Weight:      185 pounds BMI:     30.90 Pulse rate:   70 / minute Resp:     16 per minute BP sitting:   130 / 80  (left arm)  Vitals Entered By: Kem Parkinson (October 20, 2010 9:26 AM)  Physical Exam  General:  Affect appropriate Healthy:  appears stated age HEENT: normal Neck supple with no adenopathy JVP normal no bruits no thyromegaly Lungs clear with no wheezing and good diaphragmatic motion Heart:  S1/S2 no murmur,rub, gallop or click PMI normal Abdomen: benighn, BS positve, no tenderness, no AAA no bruit.  No HSM or HJR Distal pulses intact with no bruits No edema Neuro non-focal Skin warm and dry Interestingly S1 valve sound not metalic or crisp   Impression & Recommendations:  Problem # 1:  DYSPNEA (ICD-786.05) More related to lung disease Normal MV function and EF.  Moderate elevatoin in PA pressure by echo but intolerant to revatio.  Follow for now and consider right heart cath in future Her updated medication list for this problem includes:    Lasix 80 Mg Tabs (Furosemide) .Marland Kitchen... 1 tab bid    Aspirin Adult Low Strength 81 Mg Tbec (Aspirin) .Marland Kitchen... 1 tab by mouth once daily    Coreg 25 Mg Tabs (Carvedilol) .Marland Kitchen... Take one tablet two times a day    Aldactone 25 Mg Tabs (Spironolactone) .Marland Kitchen... 1 once  daily  Problem # 2:  CAROTID OCCLUSIVE DISEASE (ICD-433.10) F/U duplex in 6 months Her updated medication list for this problem includes:    Aspirin Adult Low Strength 81 Mg Tbec (Aspirin) .Marland Kitchen... 1 tab by mouth once daily    Warfarin Sodium 2.5 Mg Tabs (Warfarin sodium) ..... Use as directed by anticoagualtion clinic  Problem # 3:  HYPERCHOLESTEROLEMIA (ICD-272.0) At goal continue statin Her updated medication list for this problem includes:    Lipitor 20 Mg Tabs (Atorvastatin calcium) .Marland Kitchen... Take one tablet at bedtime  CHOL: 190 (09/29/2010)   LDL: 80 (03/10/2010)   HDL: 38.70 (09/29/2010)   TG: 222.0 (09/29/2010)  Problem # 4:  ESSENTIAL HYPERTENSION, BENIGN (ICD-401.1) Well controlled Her updated medication list for this problem includes:    Lasix 80 Mg Tabs (Furosemide) .Marland Kitchen... 1 tab bid    Aspirin Adult Low Strength 81 Mg Tbec (Aspirin) .Marland Kitchen... 1 tab  by mouth once daily    Coreg 25 Mg Tabs (Carvedilol) .Marland Kitchen... Take one tablet two times a day    Aldactone 25 Mg Tabs (Spironolactone) .Marland Kitchen... 1 once daily  Problem # 5:  MITRAL VALVE REPLACEMENT, HX OF (ICD-V15.1) Exam unusual with no metalic click but normal funciton by echo today.  Continue coumadin  Patient Instructions: 1)  Your physician wants you to follow-up in:6 MONTHS   You will receive a reminder letter in the mail two months in advance. If you don't receive a letter, please call our office to schedule the follow-up appointment.

## 2010-12-17 NOTE — Assessment & Plan Note (Signed)
Summary: FOLLOWUP ON CHOLESTEROL AND DIABETES/KN   Vital Signs:  Patient profile:   77 year old female Weight:      185 pounds Pulse rate:   74 / minute BP sitting:   130 / 80  (left arm)  Vitals Entered By: Doristine Devoid CMA (September 29, 2010 8:49 AM) CC: 3 month roa and labs questions about zinc and shingles vaccine   History of Present Illness: 77 yo woman here today for  1) DM- CBGs have been 'really really good'.  96-130s fasting.  denies symptomatic lows.  on Glyburide.  UTD on eye exam.  no CP, SOB (more than usual), HAs, visual changes, edema  2) Hyperlipidemia- on lipitor.  no abd pain, N/V, myalgias  3) HTN- adequate control today.  asymptomatic.  4) shingles vaccine- hx of shingles x1 'years ago'.  wants to know if she should have vaccine.  not sure if insurance covers this.  5) hip pain- would like vicodin refill  Current Medications (verified): 1)  Lasix 80 Mg Tabs (Furosemide) .Marland Kitchen.. 1 Tab Bid 2)  Aspirin Adult Low Strength 81 Mg Tbec (Aspirin) .Marland Kitchen.. 1 Tab By Mouth Once Daily 3)  Folic Acid 800 Mcg Tabs (Folic Acid) .... Take One Tablet Daily 4)  Lipitor 20 Mg Tabs (Atorvastatin Calcium) .... Take One Tablet At Bedtime 5)  Ferrous Sulfate 325 (65 Fe) Mg Tabs (Ferrous Sulfate) .... Take Two Times A Day 6)  Aranesp (Albumin Free) 60 Mcg/ml Soln (Darbepoetin Alfa-Polysorbate) .... As Needed 7)  Imdur 30 Mg Xr24h-Tab (Isosorbide Mononitrate) .Marland Kitchen.. 1 Tab Daily 8)  Colace 100 Mg Caps (Docusate Sodium) .Marland Kitchen.. 1 Tab Two Times A Day 9)  Glyburide 5 Mg Tabs (Glyburide) .Marland Kitchen.. 1 Tab Am and 1/2 Tab Pm 10)  Coreg 25 Mg Tabs (Carvedilol) .... Take One Tablet Two Times A Day 11)  Warfarin Sodium 2.5 Mg Tabs (Warfarin Sodium) .... Use As Directed By Anticoagualtion Clinic 12)  Robaxin 500 Mg Tabs (Methocarbamol) .Marland Kitchen.. 1 Tab Two Times A Day 13)  Vicodin 5-500 Mg Tabs (Hydrocodone-Acetaminophen) .... Take One Tablet Three Times A Day As Needed 14)  Fosamax 35 Mg Tabs (Alendronate  Sodium) .... Take One Tablet Weekly 15)  Es Tylenol .... As Needed 16)  Tylenol Pm .... As Needed 17)  Aldactone 25 Mg Tabs (Spironolactone) .Marland Kitchen.. 1 Once Daily 18)  Truetest Test  Strp (Glucose Blood) .... Test Once Daily  Allergies (verified): 1)  ! Bactrim (Sulfamethoxazole-Trimethoprim) 2)  ! Demerol (Meperidine Hcl) 3)  ! Heparin  Past History:  Past medical, surgical, family and social histories (including risk factors) reviewed, and no changes noted (except as noted below).  Past Medical History: Reviewed history from 10/07/2008 and no changes required. PAF/Flutter:  EPS procedure 2003 aborted ablation with left side flutter MVR Cox [id number] CHF diastolic Hypertension PUlmonary hypertension Arthritis TIA Diabetes Hypercholesterolemia Anemia CRF  Past Surgical History: Reviewed history from 06/11/2009 and no changes required. Arthroscopy:  07/06/2007 right knee [place] MVR with TV annuloplasty Glower [facility] 04/2004 Rotator Cuff: 02/24/2006 [place] Hysterectomy L hip fx and repair 6/10  Family History: Reviewed history from 09/11/2008 and no changes required. Negative FH of Diabetes, Hypertension, or Coronary Artery Disease  Social History: Reviewed history from 10/28/2008 and no changes required. Widowed  3 Children daughter with Lupus Non smoker non drinker  Review of Systems      See HPI  Physical Exam  General:  Well-developed,well-nourished,in no acute distress; alert,appropriate and cooperative throughout examination Head:  Normocephalic and  atraumatic without obvious abnormalities. Eyes:  PERRL, EOMI Neck:  No deformities, masses, or tenderness noted. Lungs:  Normal respiratory effort, chest expands symmetrically. Lungs are clear to auscultation, no crackles or wheezes. Heart:  reg S1/S2, mitral click Abdomen:  soft, NT/ND, +BS Pulses:  +2 radial, carotid, DP Extremities:  no C/C/E Psych:  Cognition and judgment appear intact. Alert and cooperative  with normal attention span and concentration. No apparent delusions, illusions, hallucinations   Impression & Recommendations:  Problem # 1:  DIAB W/UNSPEC COMP TYPE II/UNSPEC TYPE UNCNTRL (ICD-250.92) Assessment Unchanged fairly good sugar control.  check labs and adjust meds as needed.  not on ACE- has never been.  will discuss w/ cards if there is a reason why. Her updated medication list for this problem includes:    Aspirin Adult Low Strength 81 Mg Tbec (Aspirin) .Marland Kitchen... 1 tab by mouth once daily    Glyburide 5 Mg Tabs (Glyburide) .Marland Kitchen... 1 tab am and 1/2 tab pm  Orders: Venipuncture (04540) TLB-A1C / Hgb A1C (Glycohemoglobin) (83036-A1C) TLB-BMP (Basic Metabolic Panel-BMET) (80048-METABOL) Specimen Handling (98119)  Problem # 2:  ESSENTIAL HYPERTENSION, BENIGN (ICD-401.1) Assessment: Unchanged adequate BP control.  asymptomatic. Her updated medication list for this problem includes:    Lasix 80 Mg Tabs (Furosemide) .Marland Kitchen... 1 tab bid    Coreg 25 Mg Tabs (Carvedilol) .Marland Kitchen... Take one tablet two times a day    Aldactone 25 Mg Tabs (Spironolactone) .Marland Kitchen... 1 once daily  Problem # 3:  HYPERCHOLESTEROLEMIA (ICD-272.0) Assessment: Unchanged tolerating statin w/out difficulty.  check labs and adjust meds as needed. Her updated medication list for this problem includes:    Lipitor 20 Mg Tabs (Atorvastatin calcium) .Marland Kitchen... Take one tablet at bedtime  Orders: TLB-Hepatic/Liver Function Pnl (80076-HEPATIC) TLB-Lipid Panel (80061-LIPID) Specimen Handling (14782)  Problem # 4:  HIP PAIN (ICD-719.45) Assessment: Unchanged refill on Vicodin provided. Her updated medication list for this problem includes:    Aspirin Adult Low Strength 81 Mg Tbec (Aspirin) .Marland Kitchen... 1 tab by mouth once daily    Robaxin 500 Mg Tabs (Methocarbamol) .Marland Kitchen... 1 tab two times a day    Vicodin 5-500 Mg Tabs (Hydrocodone-acetaminophen) .Marland Kitchen... Take one tablet three times a day as needed  Complete Medication List: 1)  Lasix 80  Mg Tabs (Furosemide) .Marland Kitchen.. 1 tab bid 2)  Aspirin Adult Low Strength 81 Mg Tbec (Aspirin) .Marland Kitchen.. 1 tab by mouth once daily 3)  Folic Acid 800 Mcg Tabs (Folic acid) .... Take one tablet daily 4)  Lipitor 20 Mg Tabs (Atorvastatin calcium) .... Take one tablet at bedtime 5)  Ferrous Sulfate 325 (65 Fe) Mg Tabs (Ferrous sulfate) .... Take two times a day 6)  Aranesp (albumin Free) 60 Mcg/ml Soln (Darbepoetin alfa-polysorbate) .... As needed 7)  Imdur 30 Mg Xr24h-tab (Isosorbide mononitrate) .Marland Kitchen.. 1 tab daily 8)  Colace 100 Mg Caps (Docusate sodium) .Marland Kitchen.. 1 tab two times a day 9)  Glyburide 5 Mg Tabs (Glyburide) .Marland Kitchen.. 1 tab am and 1/2 tab pm 10)  Coreg 25 Mg Tabs (Carvedilol) .... Take one tablet two times a day 11)  Warfarin Sodium 2.5 Mg Tabs (Warfarin sodium) .... Use as directed by anticoagualtion clinic 12)  Robaxin 500 Mg Tabs (Methocarbamol) .Marland Kitchen.. 1 tab two times a day 13)  Vicodin 5-500 Mg Tabs (Hydrocodone-acetaminophen) .... Take one tablet three times a day as needed 14)  Fosamax 35 Mg Tabs (Alendronate sodium) .... Take one tablet weekly 15)  Es Tylenol  .... As needed 16)  Tylenol Pm  .Marland KitchenMarland KitchenMarland Kitchen  As needed 17)  Aldactone 25 Mg Tabs (Spironolactone) .Marland Kitchen.. 1 once daily 18)  Truetest Test Strp (Glucose blood) .... Test once daily  Patient Instructions: 1)  Folllow up in 3 months for the diabetes- sooner if you need me! (you can eat before this appt) 2)  We'll notify you of your lab results 3)  Call your insurance company and ask about the shingles shot 4)  Call with any questions or concerns 5)  Happy Holidays!!! Prescriptions: VICODIN 5-500 MG TABS (HYDROCODONE-ACETAMINOPHEN) take one tablet three times a day as needed  #60 x 0   Entered and Authorized by:   Neena Rhymes MD   Signed by:   Neena Rhymes MD on 09/29/2010   Method used:   Print then Give to Patient   RxID:   [id number]    Orders Added: 1)  Venipuncture [13086] 2)  TLB-A1C / Hgb A1C (Glycohemoglobin)  [83036-A1C] 3)  TLB-BMP (Basic Metabolic Panel-BMET) [80048-METABOL] 4)  TLB-Hepatic/Liver Function Pnl [80076-HEPATIC] 5)  TLB-Lipid Panel [80061-LIPID] 6)  Specimen Handling [99000] 7)  Est. Patient Level IV [57846]

## 2010-12-17 NOTE — Miscellaneous (Signed)
  Clinical Lists Changes  Observations: Added new observation of FLU VAX: Historical (08/18/2010 11:44)      Immunization History:  Influenza Immunization History:    Influenza:  historical (08/18/2010)

## 2010-12-17 NOTE — Medication Information (Signed)
Summary: CCR/DM  Anticoagulant Therapy  Managed by: Bethena Midget, RN, BSN Referring MD: Charlton Haws MD PCP: Neena Rhymes MD Supervising MD: Excell Seltzer MD, Casimiro Needle Indication 1: Mitral Valve Replacement (ICD-V43.3) Indication 2: TIA (ICD-435.9) Lab Used: [facility] [place] Site: [address] INR POC 3.2 INR RANGE 2.5 - 3.5  Dietary changes: no    Health status changes: no    Bleeding/hemorrhagic complications: no    Recent/future hospitalizations: no    Any changes in medication regimen? no    Recent/future dental: no  Any missed doses?: no       Is patient compliant with meds? yes       Allergies: 1)  ! Bactrim (Sulfamethoxazole-Trimethoprim) 2)  ! Demerol (Meperidine Hcl) 3)  ! Heparin  Anticoagulation Management History:      The patient is taking warfarin and comes in today for a routine follow up visit.  Positive risk factors for bleeding include an age of 75 years or older, history of CVA/TIA, and presence of serious comorbidities.  The bleeding index is 'high risk'.  Positive CHADS2 values include History of CHF, History of HTN, Age > 62 years old, History of Diabetes, and Prior Stroke/CVA/TIA.  The start date was 08/21/2001.  Her last INR was 4.9 RATIO.  Anticoagulation responsible provider: Excell Seltzer MD, Casimiro Needle.  INR POC: 3.2.  Cuvette Lot#: [id number].  Exp: 02/2011.    Anticoagulation Management Assessment/Plan:      The patient's current anticoagulation dose is Warfarin sodium 2.5 mg tabs: Use as directed by Anticoagualtion Clinic.  The target INR is 2.5 - 3.5.  The next INR is due 01/12/2010.  Anticoagulation instructions were given to patient.  Results were reviewed/authorized by Bethena Midget, RN, BSN.  She was notified by Bethena Midget, RN, BSN.         Prior Anticoagulation Instructions: INR 4.1  Skip 1 day then 1 tab each Wednesday and Saturday and 1.5 tabs on all other days.  Recheck in 3 weeks.   Current Anticoagulation  Instructions: INR 3.2 Continue 3.75mg s everyday except 2.5mg s on Wednesdays and Saturdays. Recheck in  3 weeks.

## 2010-12-17 NOTE — Miscellaneous (Signed)
Summary: Flu/[facility]  Flu/[facility]   Imported By: Lanelle Bal 09/02/2010 15:57:33  _____________________________________________________________________  External Attachment:    Type:   Image     Comment:   External Document

## 2010-12-17 NOTE — Miscellaneous (Signed)
  Clinical Lists Changes  Observations: Added new observation of BONE DENSITY: osteoporosis (03/25/2010 11:24) Added new observation of MAMMOGRAM: normal (03/25/2010 11:24)      Preventive Care Screening  Bone Density:    Date:  03/25/2010    Results:  osteoporosis  Mammogram:    Date:  03/25/2010    Results:  normal

## 2010-12-17 NOTE — Medication Information (Signed)
Summary: rov/sel  Anticoagulant Therapy  Managed by: Cloyde Reams, RN, BSN Referring MD: Charlton Haws MD PCP: Neena Rhymes MD Supervising MD: Patty Sermons Indication 1: Mitral Valve Replacement (ICD-V43.3) Indication 2: TIA (ICD-435.9) Lab Used: [facility] [place] Site: [address] INR POC 3.8 INR RANGE 2.5 - 3.5  Dietary changes: no    Health status changes: no    Bleeding/hemorrhagic complications: no    Recent/future hospitalizations: no    Any changes in medication regimen? no    Recent/future dental: no  Any missed doses?: no       Is patient compliant with meds? yes       Allergies: 1)  ! Bactrim (Sulfamethoxazole-Trimethoprim) 2)  ! Demerol (Meperidine Hcl) 3)  ! Heparin  Anticoagulation Management History:      The patient is taking warfarin and comes in today for a routine follow up visit.  Positive risk factors for bleeding include an age of 30 years or older, history of CVA/TIA, and presence of serious comorbidities.  The bleeding index is 'high risk'.  Positive CHADS2 values include History of CHF, History of HTN, Age > 48 years old, History of Diabetes, and Prior Stroke/CVA/TIA.  The start date was 08/21/2001.  Her last INR was 4.9 RATIO.  Anticoagulation responsible provider: [place].  INR POC: 3.8.  Cuvette Lot#: [id number].  Exp: 10/2011.    Anticoagulation Management Assessment/Plan:      The patient's current anticoagulation dose is Warfarin sodium 2.5 mg tabs: Use as directed by Anticoagualtion Clinic.  The target INR is 2.5 - 3.5.  The next INR is due 10/15/2010.  Anticoagulation instructions were given to patient.  Results were reviewed/authorized by Cloyde Reams, RN, BSN.  She was notified by Cloyde Reams RN.         Prior Anticoagulation Instructions: INR 3.0  Continue taking 1 1/2 tablets everyday except 1 tablet on Wednesday and Saturday. Recheck in 4 weeks.  Current Anticoagulation Instructions: INR 3.8  Take 1/2 tablet  today, then resume same dosage 1.5 tablets daily except 1 tablet on Wednesdays and Saturdays.  Recheck in 4 weeks.

## 2010-12-17 NOTE — Assessment & Plan Note (Signed)
Summary: F8M/DM   Visit Type:  Follow-up Primary Provider:  Neena Rhymes MD  CC:  increase sob.  History of Present Illness: Laurielle has a history of mechanical mitral valve replacement with maze procedure.  She has switched  to generic warfarin.  I told her this would be fine but we may have to follow her INR more closely.  Her INR was a bit high  She knows to take SBE prophylaxis and will see the dentist next week.  Is not injected chest pain palpitations PND or orthopnea.  No bleeding diathesis.  She has known carotid disease on the right-hand side.  We thought it was fairly significant but CT angiography graded it at only 35%.  She had an evaluation by Dr. Edilia Bo who did not feel surgery was indicated. Her duplex today was reviewed and showed stable 60-79% RICA disease.   She has been having some exertional dyspnea but no overt signs of failure  Denies fever cough sputum PND or othopnea  Current Problems (verified): 1)  Dyspnea  (ICD-786.05) 2)  Hip Fracture, Left  (ICD-820.8) 3)  Postmenopausal Status  (ICD-V49.81) 4)  Vitamin D Deficiency  (ICD-268.9) 5)  Hip Pain  (ICD-719.45) 6)  Low Blood Pressure  (ICD-458.9) 7)  Weight Loss  (ICD-783.21) 8)  Fatigue  (ICD-780.79) 9)  Uns Advrs Eff Uns Rx Medicinal&biological Sbstnc  (ICD-995.20) 10)  Anemia  (ICD-285.9) 11)  Carotid Occlusive Disease  (ICD-433.10) 12)  Diab W/unspec Comp Type Ii/unspec Type Uncntrl  (ICD-250.92) 13)  Hypercholesterolemia  (ICD-272.0) 14)  Essential Hypertension, Benign  (ICD-401.1) 15)  Tia  (ICD-435.9) 16)  Chronic Diastolic Heart Failure  (ICD-428.32) 17)  Atrial Fibrillation  (ICD-427.31) 18)  Mitral Valve Replacement, Hx of  (ICD-V15.1)  Current Medications (verified): 1)  Lasix 80 Mg Tabs (Furosemide) .Marland Kitchen.. 1 Tab Bid 2)  Aspirin Adult Low Strength 81 Mg Tbec (Aspirin) .Marland Kitchen.. 1 Tab By Mouth Once Daily 3)  Folic Acid 800 Mcg Tabs (Folic Acid) .... Take One Tablet Daily 4)  Lipitor 20 Mg Tabs  (Atorvastatin Calcium) .... Take One Tablet At Bedtime 5)  Ferrous Sulfate 325 (65 Fe) Mg Tabs (Ferrous Sulfate) .... Take Two Times A Day 6)  Aranesp (Albumin Free) 60 Mcg/ml Soln (Darbepoetin Alfa-Polysorbate) .... As Needed 7)  Imdur 30 Mg Xr24h-Tab (Isosorbide Mononitrate) .Marland Kitchen.. 1 Tab Daily 8)  Colace 100 Mg Caps (Docusate Sodium) .Marland Kitchen.. 1 Tab Two Times A Day 9)  Glyburide 5 Mg Tabs (Glyburide) .Marland Kitchen.. 1 Tab Am and 1/2 Tab Pm 10)  Coreg 25 Mg Tabs (Carvedilol) .... Take One Tablet Two Times A Day 11)  Klor-Con M10 10 Meq Cr-Tabs (Potassium Chloride Crys Cr) .... 5 Tablets Two Times A Day 12)  Warfarin Sodium 2.5 Mg Tabs (Warfarin Sodium) .... Use As Directed By Anticoagualtion Clinic 13)  Robaxin 500 Mg Tabs (Methocarbamol) .Marland Kitchen.. 1 Tab Two Times A Day 14)  Vicodin 5-500 Mg Tabs (Hydrocodone-Acetaminophen) .... Take One Tablet Three Times A Day As Needed 15)  Ergocalciferol 50000 Unit Caps (Ergocalciferol) .Marland Kitchen.. 1 By Mouth Weekly For 12 Weeks Then Recheck Vit D Level 16)  Fosamax 35 Mg Tabs (Alendronate Sodium) .... Take One Tablet Weekly 17)  Es Tylenol .... As Needed 18)  Tylenol Pm .... As Needed  Allergies (verified): 1)  ! Bactrim (Sulfamethoxazole-Trimethoprim) 2)  ! Demerol (Meperidine Hcl) 3)  ! Heparin  Past History:  Past Medical History: Last updated: 10/07/2008 PAF/Flutter:  EPS procedure 2003 aborted ablation with left side flutter MVR Cox [id number]  CHF diastolic Hypertension PUlmonary hypertension Arthritis TIA Diabetes Hypercholesterolemia Anemia CRF  Past Surgical History: Last updated: 06/11/2009 Arthroscopy:  07/06/2007 right knee [place] MVR with TV annuloplasty [facility] 04/2004 Rotator Cuff: 02/24/2006 Giofre Hysterectomy L hip fx and repair 6/10  Family History: Last updated: 09/11/2008 Negative FH of Diabetes, Hypertension, or Coronary Artery Disease  Social History: Last updated: 10/28/2008 Widowed  3 Children daughter with Lupus Non smoker non  drinker  Review of Systems       Denies fever, malais, weight loss, blurry vision, decreased visual acuity, cough, sputum, SOB, hemoptysis, pleuritic pain, palpitaitons, heartburn, abdominal pain, melena, lower extremity edema, claudication, or rash.   Vital Signs:  Patient profile:   77 year old female Height:      65 inches Weight:      180 pounds Pulse rate:   77 / minute BP sitting:   140 / 70 Cuff size:   large  Vitals Entered By: Burnett Kanaris, CNA (June 03, 2010 2:53 PM)  Physical Exam  General:  Affect appropriate Healthy:  appears stated age HEENT: normal Neck supple with no adenopathy JVP normal bilateral  bruits no thyromegaly Lungs clear with no wheezing and good diaphragmatic motion Heart:  S1/S2 click no murmur,rub, gallop or click PMI normal Abdomen: benighn, BS positve, no tenderness, no AAA no bruit.  No HSM or HJR Distal pulses intact with no bruits No edema Neuro non-focal Skin warm and dry    Impression & Recommendations:  Problem # 1:  DYSPNEA (ICD-786.05) Check BNP and CXR continue current dose of lasix Her updated medication list for this problem includes:    Lasix 80 Mg Tabs (Furosemide) .Marland Kitchen... 1 tab bid    Aspirin Adult Low Strength 81 Mg Tbec (Aspirin) .Marland Kitchen... 1 tab by mouth once daily    Coreg 25 Mg Tabs (Carvedilol) .Marland Kitchen... Take one tablet two times a day  Orders: T-2 View CXR (71020TC) Echocardiogram (Echo)  Problem # 2:  CAROTID OCCLUSIVE DISEASE (ICD-433.10) F/U duplex in 6 months Her updated medication list for this problem includes:    Aspirin Adult Low Strength 81 Mg Tbec (Aspirin) .Marland Kitchen... 1 tab by mouth once daily    Warfarin Sodium 2.5 Mg Tabs (Warfarin sodium) ..... Use as directed by anticoagualtion clinic  Problem # 3:  ATRIAL FIBRILLATION (ICD-427.31) Stable continue anticoagulation with generic coumadin Her updated medication list for this problem includes:    Aspirin Adult Low Strength 81 Mg Tbec (Aspirin) .Marland Kitchen... 1 tab by  mouth once daily    Coreg 25 Mg Tabs (Carvedilol) .Marland Kitchen... Take one tablet two times a day    Warfarin Sodium 2.5 Mg Tabs (Warfarin sodium) ..... Use as directed by anticoagualtion clinic  Problem # 4:  MITRAL VALVE REPLACEMENT, HX OF (ICD-V15.1) Normal function on exam  Will review previous echo.  Continue anticoagulation  Other Orders: TLB-CBC Platelet - w/Differential (85025-CBCD) TLB-BNP (B-Natriuretic Peptide) (83880-BNPR) TLB-BMP (Basic Metabolic Panel-BMET) (80048-METABOL)  Patient Instructions: 1)  Your physician recommends that you schedule a follow-up appointment in: 3 MONTHS WITH DR Eden Emms 2)  Your physician recommends that you return for lab work in: TODAY BMET BNP CBC 3)  Your physician recommends that you continue on your current medications as directed. Please refer to the Current Medication list given to you today. 4)  A chest x-ray takes a picture of the organs and structures inside the chest, including the heart, lungs, and blood vessels. This test can show several things, including, whether the heart is enlarged; whether  fluid is building up in the lungs; and whether pacemaker / defibrillator leads are still in place. 5)  Your physician has requested that you have an echocardiogram.  Echocardiography is a painless test that uses sound waves to create images of your heart. It provides your doctor with information about the size and shape of your heart and how well your heart's chambers and valves are working.  This procedure takes approximately one hour. There are no restrictions for this procedure.

## 2010-12-17 NOTE — Letter (Signed)
Summary: [facility]   Imported By: Lanelle Bal 03/25/2010 13:01:46  _____________________________________________________________________  External Attachment:    Type:   Image     Comment:   External Document

## 2010-12-17 NOTE — Letter (Signed)
[facility]   Imported By: Lanelle Bal 03/13/2010 11:26:52  _____________________________________________________________________  External Attachment:    Type:   Image     Comment:   External Document

## 2010-12-17 NOTE — Medication Information (Signed)
Summary: rov/sp  Anticoagulant Therapy  Managed by: [name], PharmD Referring MD: Charlton Haws MD PCP: Neena Rhymes MD Supervising MD: Shirlee Latch MD, Freida Busman Indication 1: Mitral Valve Replacement (ICD-V43.3) Indication 2: TIA (ICD-435.9) Lab Used: [facility] Site: [address] INR POC 2.9 INR RANGE 2.5 - 3.5  Dietary changes: no    Health status changes: no    Bleeding/hemorrhagic complications: no    Recent/future hospitalizations: no    Any changes in medication regimen? no    Recent/future dental: no  Any missed doses?: no       Is patient compliant with meds? yes       Allergies: 1)  ! Bactrim (Sulfamethoxazole-Trimethoprim) 2)  ! Demerol (Meperidine Hcl) 3)  ! Heparin  Anticoagulation Management History:      The patient is taking warfarin and comes in today for a routine follow up visit.  Positive risk factors for bleeding include an age of 18 years or older, history of CVA/TIA, and presence of serious comorbidities.  The bleeding index is 'high risk'.  Positive CHADS2 values include History of CHF, History of HTN, Age > 73 years old, History of Diabetes, and Prior Stroke/CVA/TIA.  The start date was 08/21/2001.  Her last INR was 4.9 RATIO.  Anticoagulation responsible provider: Shirlee Latch MD, [place].  INR POC: 2.9.  Cuvette Lot#: [id number].  Exp: 07/2011.    Anticoagulation Management Assessment/Plan:      The patient's current anticoagulation dose is Warfarin sodium 2.5 mg tabs: Use as directed by Anticoagualtion Clinic.  The target INR is 2.5 - 3.5.  The next INR is due 07/02/2010.  Anticoagulation instructions were given to patient.  Results were reviewed/authorized by [name], PharmD.  She was notified by Dillard Cannon.         Prior Anticoagulation Instructions: INR 2.8  Continue same dose of 1 1/2 tablets every day except 1 tablet on Wednesday and Saturday.    Current Anticoagulation Instructions: INR 2.9  Continue same dose of  1.5 tabs daily except for 1 tab on Wednesday and Friday.  Recheck in 4 weeks.

## 2010-12-17 NOTE — Medication Information (Signed)
Summary: rov/ln  Anticoagulant Therapy  Managed by: [name], PharmD Referring MD: Charlton Haws MD PCP: Neena Rhymes MD Supervising MD: Gala Romney MD, Reuel Boom Indication 1: Mitral Valve Replacement (ICD-V43.3) Indication 2: TIA (ICD-435.9) Lab Used: [facility] Site: [address] INR POC 3.8 INR RANGE 2.5 - 3.5  Dietary changes: no    Health status changes: no    Bleeding/hemorrhagic complications: no    Recent/future hospitalizations: no    Any changes in medication regimen? no    Recent/future dental: no  Any missed doses?: no       Is patient compliant with meds? yes       Allergies: 1)  ! Bactrim (Sulfamethoxazole-Trimethoprim) 2)  ! Demerol (Meperidine Hcl) 3)  ! Heparin  Anticoagulation Management History:      The patient is taking warfarin and comes in today for a routine follow up visit.  Positive risk factors for bleeding include an age of 43 years or older, history of CVA/TIA, and presence of serious comorbidities.  The bleeding index is 'high risk'.  Positive CHADS2 values include History of CHF, History of HTN, Age > 85 years old, History of Diabetes, and Prior Stroke/CVA/TIA.  The start date was 08/21/2001.  Her last INR was 4.9 RATIO.  Anticoagulation responsible provider: Bensimhon MD, Reuel Boom.  INR POC: 3.8.  Cuvette Lot#: [id number].  Exp: 07/2011.    Anticoagulation Management Assessment/Plan:      The patient's current anticoagulation dose is Warfarin sodium 2.5 mg tabs: Use as directed by Anticoagualtion Clinic.  The target INR is 2.5 - 3.5.  The next INR is due 07/23/2010.  Anticoagulation instructions were given to patient.  Results were reviewed/authorized by [name], PharmD.  She was notified by Liana Gerold, PharmD Candidate.         Prior Anticoagulation Instructions: INR 2.9  Continue same dose of 1.5 tabs daily except for 1 tab on Wednesday and Friday.  Recheck in 4 weeks.  Current Anticoagulation  Instructions: INR 3.8  Skip today's dose, then resume 1.5 tablets daily except 1 tablet Wed and Sat.  Return to clinic in 3 weeks.

## 2010-12-17 NOTE — Miscellaneous (Signed)
Summary: [facility]  Clinical Lists Changes  Observations: Added new observation of DIAB EYE EX: No diabetic retinopathy.    (03/20/2010 10:59)        Diabetic Eye Exam  Procedure date:  03/20/2010  Findings:      No diabetic retinopathy.

## 2010-12-17 NOTE — Progress Notes (Signed)
Summary: pt request call  Phone Note Call from Patient Call back at Home Phone [phone]   Caller: Patient Summary of Call: Pt request call Initial call taken by: Judie Grieve,  July 30, 2010 9:09 AM  Follow-up for Phone Call        spoke with pt, she has started the revito, after the first dose she developed nasal congestion that lasted about 2 hours. she also reports a headache that last all day and woke her from her sleep last night. she also reports blurred vision in her left eye. she had spoken to the pharm and these are all side effects of reviato. she is now scared to take the med. will talk with sally pharm md in the office to confirm or deny her concerns. [name], RN  July 30, 2010 10:43 AM   Additional Follow-up for Phone Call Additional follow up Details #1::        discussed with sall pharm md, nasal congestion and headache are side effects from the med the nasal congestion probably did not come from the first dose. pt was instructed to try the med for at least one to two weeks and sometimes the headaches will decrease. pt is very scared of the med and is unsure she will try the med again. she understands that any med in that class can cause a headache. she will decide if she will take the med or not [name], RN  July 30, 2010 4:24 PM

## 2010-12-17 NOTE — Assessment & Plan Note (Signed)
Summary: Kelsey Cochran/needs carotid sameday/debra   Primary Provider:  Neena Rhymes MD  CC:  sob on exerction.  History of Present Illness: Tagen has a history of mechanical mitral valve replacement with maze procedure.  She has switched  to generic warfarin.  I told her this would be fine but we may have to follow her INR more closely.  Her INR was a bit high  She knows to take SBE prophylaxis and will see the dentist next week.  Is not injected chest pain palpitations PND or orthopnea.  No bleeding diathesis.  She has known carotid disease on the right-hand side.  We thought it was fairly significant but CT angiography graded it at only 35%.  She had an evaluation by Dr. Edilia Bo who did not feel surgery was indicated. Her duplex today was reviewed and showed stable 60-79% RICA disease.    Current Problems (verified): 1)  Vitamin D Deficiency  (ICD-268.9) 2)  Hip Pain  (ICD-719.45) 3)  Low Blood Pressure  (ICD-458.9) 4)  Weight Loss  (ICD-783.21) 5)  Fatigue  (ICD-780.79) 6)  Uns Advrs Eff Uns Rx Medicinal&biological Sbstnc  (ICD-995.20) 7)  Anemia  (ICD-285.9) 8)  Carotid Occlusive Disease  (ICD-433.10) 9)  Diab W/unspec Comp Type Ii/unspec Type Uncntrl  (ICD-250.92) 10)  Hypercholesterolemia  (ICD-272.0) 11)  Essential Hypertension, Benign  (ICD-401.1) 12)  Tia  (ICD-435.9) 13)  Chronic Diastolic Heart Failure  (ICD-428.32) 14)  Atrial Fibrillation  (ICD-427.31) 15)  Mitral Valve Replacement, Hx of  (ICD-V15.1)  Current Medications (verified): 1)  Lasix 80 Mg Tabs (Furosemide) .Marland Kitchen.. 1 Tab Bid 2)  Aspirin Adult Low Strength 81 Mg Tbec (Aspirin) .Marland Kitchen.. 1 Tab By Mouth Once Daily 3)  Folic Acid 800 Mcg Tabs (Folic Acid) .... Take One Tablet Daily 4)  Lipitor 20 Mg Tabs (Atorvastatin Calcium) .... Take One Tablet At Bedtime 5)  Ferrous Sulfate 325 (65 Fe) Mg Tabs (Ferrous Sulfate) .... Take Two Times A Day 6)  Aranesp (Albumin Free) 60 Mcg/ml Soln (Darbepoetin Alfa-Polysorbate) .... As  Needed 7)  Imdur 30 Mg Xr24h-Tab (Isosorbide Mononitrate) .Marland Kitchen.. 1 Tab Daily 8)  Colace 100 Mg Caps (Docusate Sodium) .Marland Kitchen.. 1 Tab Two Times A Day 9)  Glyburide 5 Mg Tabs (Glyburide) .... Take One Tablet Two Times A Day 10)  Coreg 25 Mg Tabs (Carvedilol) .... Take One Tablet Two Times A Day 11)  Klor-Con M10 10 Meq Cr-Tabs (Potassium Chloride Crys Cr) .... 5 Tablets Two Times A Day 12)  Warfarin Sodium 2.5 Mg Tabs (Warfarin Sodium) .... Use As Directed By Anticoagualtion Clinic 13)  Robaxin 500 Mg Tabs (Methocarbamol) .Marland Kitchen.. 1 Tab Two Times A Day 14)  Vicodin 5-500 Mg Tabs (Hydrocodone-Acetaminophen) .... Take One Tablet Three Times A Day As Needed  Allergies (verified): 1)  ! Bactrim (Sulfamethoxazole-Trimethoprim) 2)  ! Demerol (Meperidine Hcl) 3)  ! Heparin  Past History:  Past Medical History: Last updated: 10/07/2008 PAF/Flutter:  EPS procedure 2003 aborted ablation with left side flutter MVR Cox [id number] CHF diastolic Hypertension PUlmonary hypertension Arthritis TIA Diabetes Hypercholesterolemia Anemia CRF  Past Surgical History: Last updated: 06/11/2009 Arthroscopy:  07/06/2007 right knee [place] MVR with TV annuloplasty [facility] 04/2004 Rotator Cuff: 02/24/2006 Giofre Hysterectomy L hip fx and repair 6/10  Family History: Last updated: 09/11/2008 Negative FH of Diabetes, Hypertension, or Coronary Artery Disease  Social History: Last updated: 10/28/2008 Widowed  3 Children daughter with Lupus Non smoker non drinker  Review of Systems       Denies fever, malais,  weight loss, blurry vision, decreased visual acuity, cough, sputum, SOB, hemoptysis, pleuritic pain, palpitaitons, heartburn, abdominal pain, melena, lower extremity edema, claudication, or rash.   Vital Signs:  Patient profile:   77 year old female Height:      65 inches Weight:      172 pounds BMI:     28.73 Pulse rate:   85 / minute Resp:     14 per minute BP sitting:   144 / 84  (left  arm)  Vitals Entered By: Kem Parkinson (December 02, 2009 2:00 PM)  Physical Exam  General:  Affect appropriate Healthy:  appears stated age HEENT: normal Neck supple with no adenopathy JVP normal no bruits no thyromegaly Lungs clear with no wheezing and good diaphragmatic motion Heart:  S1 clic /S2 no murmur,rub, gallop or click PMI normal Abdomen: benighn, BS positve, no tenderness, no AAA no bruit.  No HSM or HJR Distal pulses intact with no bruits No edema Neuro non-focal Skin warm and dry    Impression & Recommendations:  Problem # 1:  CAROTID OCCLUSIVE DISEASE (ICD-433.10) Stable 60-79% RICA F/U duplex in 6 months Her updated medication list for this problem includes:    Aspirin Adult Low Strength 81 Mg Tbec (Aspirin) .Marland Kitchen... 1 tab by mouth once daily    Warfarin Sodium 2.5 Mg Tabs (Warfarin sodium) ..... Use as directed by anticoagualtion clinic  Problem # 2:  HYPERCHOLESTEROLEMIA (ICD-272.0) Stable with no side effects Her updated medication list for this problem includes:    Lipitor 20 Mg Tabs (Atorvastatin calcium) .Marland Kitchen... Take one tablet at bedtime  CHOL: 195 (09/08/2009)   LDL: 121 (10/30/2008)   HDL: 37.70 (09/08/2009)   TG: 222.0 (09/08/2009)  Problem # 3:  ESSENTIAL HYPERTENSION, BENIGN (ICD-401.1) Well controlled continue to use sodium sparingly Her updated medication list for this problem includes:    Lasix 80 Mg Tabs (Furosemide) .Marland Kitchen... 1 tab bid    Aspirin Adult Low Strength 81 Mg Tbec (Aspirin) .Marland Kitchen... 1 tab by mouth once daily    Coreg 25 Mg Tabs (Carvedilol) .Marland Kitchen... Take one tablet two times a day  Problem # 4:  ATRIAL FIBRILLATION (ICD-427.31) Stable maint NSR with no palpitations.  S/O MAZE in 2005 with MVR at [facility] with [facility] Her updated medication list for this problem includes:    Aspirin Adult Low Strength 81 Mg Tbec (Aspirin) .Marland Kitchen... 1 tab by mouth once daily    Coreg 25 Mg Tabs (Carvedilol) .Marland Kitchen... Take one tablet two times a day    Warfarin Sodium  2.5 Mg Tabs (Warfarin sodium) ..... Use as directed by anticoagualtion clinic  Problem # 5:  MITRAL VALVE REPLACEMENT, HX OF (ICD-V15.1) Valve sounds normla with no MR.  Coumadin dose to be adjusted.  F.U INR iin 3 weeks  Patient Instructions: 1)  Your physician recommends that you schedule a follow-up appointment in: 6 MONTHS 2)  [facility] MONDAY 12-22-09 @ 10:30AM Prescriptions: WARFARIN SODIUM 2.5 MG TABS (WARFARIN SODIUM) Use as directed by Anticoagualtion Clinic  #90 x 3   Entered by:   [name], RN   Authorized by:   Colon Branch, MD, [facility]   Signed by:   [name], RN on 12/02/2009   Method used:   Faxed to ...       [facility] (mail-order)       [address]       [place], [place]  [id number]       Ph: [phone]  Fax: [phone]   RxID:   [id number] KLOR-CON M10 10 MEQ CR-TABS (POTASSIUM CHLORIDE CRYS CR) 5 tablets two times a day  #900 x 3   Entered by:   [name], RN   Authorized by:   Colon Branch, MD, [facility]   Signed by:   [name], RN on 12/02/2009   Method used:   Faxed to ...       [facility] (mail-order)       [address]       [place], [place]  [id number]       Ph: [phone]       Fax: [phone]   RxID:   [id number] COREG 25 MG TABS (CARVEDILOL) take one tablet two times a day  #180 x 3   Entered by:   [name], RN   Authorized by:   Colon Branch, MD, [facility]   Signed by:   [name], RN on 12/02/2009   Method used:   Faxed to ...       [facility] (mail-order)       [address]       [place], [place]  [id number]       Ph: [phone]       Fax: [phone]   RxID:   [id number] IMDUR 30 MG XR24H-TAB (ISOSORBIDE MONONITRATE) 1 tab daily  #90 x 3   Entered by:   [name], RN   Authorized by:   Colon Branch, MD, [facility]   Signed by:   [name], RN on 12/02/2009   Method used:   Faxed to ...       [facility] (mail-order)       [address]       [place], [place]  [id number]       Ph: [phone]       Fax: [phone]   RxID:   [id number] LASIX 80 MG TABS (FUROSEMIDE) 1 tab bid  #180 x 4   Entered by:   [name], RN   Authorized by:   Colon Branch, MD, [facility]   Signed by:   [name], RN on 12/02/2009   Method used:   Faxed to ...       [facility] (mail-order)       [address]       [place], [place]  [id number]       Ph: [phone]       Fax: [phone]   RxID:   [id number]

## 2010-12-17 NOTE — Assessment & Plan Note (Signed)
Summary: 3 MOS FOLLOWUP/ALR--Rm 17   Vital Signs:  Patient profile:   77 year old female Height:      65 inches Weight:      180 pounds BMI:     30.06 Temp:     97.7 degrees F oral Pulse rate:   72 / minute Pulse rhythm:   regular Resp:     16 per minute BP sitting:   130 / 78  (left arm) Cuff size:   large  Vitals Entered By: Mervin Kung CMA Duncan Dull) (June 25, 2010 10:49 AM)  History of Present Illness: 77 yo woman here today for f/u on  1) DM- reports sugars have been 'good'.  CBGs for the last week were 130s but prior to that <120.  no symptomatic lows.  no N/V/D.  no neuropathy reported.  2) SOB- recent cardiac w/u was negative except for some fluid retention.  started Aldactone.  SOB 'somewhat improved'.  3) HTN- well controlled.  denies CP, HAs, visual changes, edema.  mild SOB (see above).  Problems Prior to Update: 1)  Dyspnea  (ICD-786.05) 2)  Hip Fracture, Left  (ICD-820.8) 3)  Postmenopausal Status  (ICD-V49.81) 4)  Vitamin D Deficiency  (ICD-268.9) 5)  Hip Pain  (ICD-719.45) 6)  Low Blood Pressure  (ICD-458.9) 7)  Weight Loss  (ICD-783.21) 8)  Fatigue  (ICD-780.79) 9)  Uns Advrs Eff Uns Rx Medicinal&biological Sbstnc  (ICD-995.20) 10)  Anemia  (ICD-285.9) 11)  Carotid Occlusive Disease  (ICD-433.10) 12)  Diab W/unspec Comp Type Ii/unspec Type Uncntrl  (ICD-250.92) 13)  Hypercholesterolemia  (ICD-272.0) 14)  Essential Hypertension, Benign  (ICD-401.1) 15)  Tia  (ICD-435.9) 16)  Chronic Diastolic Heart Failure  (ICD-428.32) 17)  Atrial Fibrillation  (ICD-427.31) 18)  Mitral Valve Replacement, Hx of  (ICD-V15.1)  Current Medications (verified): 1)  Lasix 80 Mg Tabs (Furosemide) .Marland Kitchen.. 1 Tab Bid 2)  Aspirin Adult Low Strength 81 Mg Tbec (Aspirin) .Marland Kitchen.. 1 Tab By Mouth Once Daily 3)  Folic Acid 800 Mcg Tabs (Folic Acid) .... Take One Tablet Daily 4)  Lipitor 20 Mg Tabs (Atorvastatin Calcium) .... Take One Tablet At Bedtime 5)  Ferrous Sulfate 325 (65 Fe) Mg  Tabs (Ferrous Sulfate) .... Take Two Times A Day 6)  Aranesp (Albumin Free) 60 Mcg/ml Soln (Darbepoetin Alfa-Polysorbate) .... As Needed 7)  Imdur 30 Mg Xr24h-Tab (Isosorbide Mononitrate) .Marland Kitchen.. 1 Tab Daily 8)  Colace 100 Mg Caps (Docusate Sodium) .Marland Kitchen.. 1 Tab Two Times A Day 9)  Glyburide 5 Mg Tabs (Glyburide) .Marland Kitchen.. 1 Tab Am and 1/2 Tab Pm 10)  Coreg 25 Mg Tabs (Carvedilol) .... Take One Tablet Two Times A Day 11)  Warfarin Sodium 2.5 Mg Tabs (Warfarin Sodium) .... Use As Directed By Anticoagualtion Clinic 12)  Robaxin 500 Mg Tabs (Methocarbamol) .Marland Kitchen.. 1 Tab Two Times A Day 13)  Vicodin 5-500 Mg Tabs (Hydrocodone-Acetaminophen) .... Take One Tablet Three Times A Day As Needed 14)  Ergocalciferol 50000 Unit Caps (Ergocalciferol) .Marland Kitchen.. 1 By Mouth Weekly For 12 Weeks Then Recheck Vit D Level 15)  Fosamax 35 Mg Tabs (Alendronate Sodium) .... Take One Tablet Weekly 16)  Es Tylenol .... As Needed 17)  Tylenol Pm .... As Needed 18)  Aldactone 25 Mg Tabs (Spironolactone) .Marland Kitchen.. 1 Once Daily  Allergies: 1)  ! Bactrim (Sulfamethoxazole-Trimethoprim) 2)  ! Demerol (Meperidine Hcl) 3)  ! Heparin  Review of Systems      See HPI  Physical Exam  General:  Well-developed,well-nourished,in no acute distress; alert,appropriate and  cooperative throughout examination Lungs:  Normal respiratory effort, chest expands symmetrically. Lungs are clear to auscultation, no crackles or wheezes. Heart:  reg S1/S2, mitral click Pulses:  +2 radial, carotid, DP Extremities:  no C/C/E Neurologic:  ambulating well w/ cane  Diabetes Management Exam:    Foot Exam (with socks and/or shoes not present):       Sensory-Pinprick/Light touch:          Left medial foot (L-4): normal          Left dorsal foot (L-5): normal          Left lateral foot (S-1): normal          Right medial foot (L-4): normal          Right dorsal foot (L-5): normal          Right lateral foot (S-1): normal       Sensory-Monofilament:          Left  foot: normal          Right foot: normal       Inspection:          Left foot: normal          Right foot: normal   Impression & Recommendations:  Problem # 1:  DIAB W/UNSPEC COMP TYPE II/UNSPEC TYPE UNCNTRL (ICD-250.92) Assessment Unchanged due for A1C.  no symptomatic lows.  normal foot exam.  recent eye exam.  will adjust meds as needed. Her updated medication list for this problem includes:    Aspirin Adult Low Strength 81 Mg Tbec (Aspirin) .Marland Kitchen... 1 tab by mouth once daily    Glyburide 5 Mg Tabs (Glyburide) .Marland Kitchen... 1 tab am and 1/2 tab pm  Orders: Venipuncture ([address]) TLB-A1C / Hgb A1C (Glycohemoglobin) (83036-A1C)  Problem # 2:  DYSPNEA (ICD-786.05) Assessment: Unchanged somewhat improved since starting Aldactone.  cards notes reviewed- normal w/u.  will continue to follow.  Problem # 3:  ESSENTIAL HYPERTENSION, BENIGN (ICD-401.1) Assessment: Unchanged BP adequate today.  asymptomatic. Her updated medication list for this problem includes:    Lasix 80 Mg Tabs (Furosemide) .Marland Kitchen... 1 tab bid    Coreg 25 Mg Tabs (Carvedilol) .Marland Kitchen... Take one tablet two times a day    Aldactone 25 Mg Tabs (Spironolactone) .Marland Kitchen... 1 once daily  Complete Medication List: 1)  Lasix 80 Mg Tabs (Furosemide) .Marland Kitchen.. 1 tab bid 2)  Aspirin Adult Low Strength 81 Mg Tbec (Aspirin) .Marland Kitchen.. 1 tab by mouth once daily 3)  Folic Acid 800 Mcg Tabs (Folic acid) .... Take one tablet daily 4)  Lipitor 20 Mg Tabs (Atorvastatin calcium) .... Take one tablet at bedtime 5)  Ferrous Sulfate 325 (65 Fe) Mg Tabs (Ferrous sulfate) .... Take two times a day 6)  Aranesp (albumin Free) 60 Mcg/ml Soln (Darbepoetin alfa-polysorbate) .... As needed 7)  Imdur 30 Mg Xr24h-tab (Isosorbide mononitrate) .Marland Kitchen.. 1 tab daily 8)  Colace 100 Mg Caps (Docusate sodium) .Marland Kitchen.. 1 tab two times a day 9)  Glyburide 5 Mg Tabs (Glyburide) .Marland Kitchen.. 1 tab am and 1/2 tab pm 10)  Coreg 25 Mg Tabs (Carvedilol) .... Take one tablet two times a day 11)  Warfarin Sodium  2.5 Mg Tabs (Warfarin sodium) .... Use as directed by anticoagualtion clinic 12)  Robaxin 500 Mg Tabs (Methocarbamol) .Marland Kitchen.. 1 tab two times a day 13)  Vicodin 5-500 Mg Tabs (Hydrocodone-acetaminophen) .... Take one tablet three times a day as needed 14)  Ergocalciferol 50000 Unit Caps (Ergocalciferol) .Marland Kitchen.. 1 by mouth  weekly for 12 weeks then recheck vit d level 15)  Fosamax 35 Mg Tabs (Alendronate sodium) .... Take one tablet weekly 16)  Es Tylenol  .... As needed 17)  Tylenol Pm  .... As needed 18)  Aldactone 25 Mg Tabs (Spironolactone) .Marland Kitchen.. 1 once daily  Patient Instructions: 1)  Follow up for your cholesterol and diabetes in 3 months- don't eat before this appt 2)  We'll notify you of your lab results 3)  Call with any questions or concerns 4)  Keep up the good work!  Current Allergies (reviewed today): ! BACTRIM (SULFAMETHOXAZOLE-TRIMETHOPRIM) ! DEMEROL (MEPERIDINE HCL) ! HEPARIN   CC: Room 17  3 month follow up. Pt having s.o.b., cardiologist not heart related? Is Patient Diabetic? Yes

## 2010-12-17 NOTE — Medication Information (Signed)
Summary: rov/tm  Anticoagulant Therapy  Managed by: [name], PharmD Referring MD: Charlton Haws MD PCP: Neena Rhymes MD Supervising MD: Eden Emms MD, Theron Arista Indication 1: Mitral Valve Replacement (ICD-V43.3) Indication 2: TIA (ICD-435.9) Lab Used: [facility] Site: [address] INR POC 2.4 INR RANGE 2.5-3.5  Dietary changes: no    Health status changes: no    Bleeding/hemorrhagic complications: no    Recent/future hospitalizations: no    Any changes in medication regimen? no    Recent/future dental: no  Any missed doses?: no       Is patient compliant with meds? yes       Allergies: 1)  ! Bactrim (Sulfamethoxazole-Trimethoprim) 2)  ! Demerol (Meperidine Hcl) 3)  ! Heparin  Anticoagulation Management History:      The patient is taking warfarin and comes in today for a routine follow up visit.  Positive risk factors for bleeding include an age of 75 years or older, history of CVA/TIA, and presence of serious comorbidities.  The bleeding index is 'high risk'.  Positive CHADS2 values include History of CHF, History of HTN, Age > 75 years old, History of Diabetes, and Prior Stroke/CVA/TIA.  The start date was 08/21/2001.  Her last INR was 4.9 RATIO.  Anticoagulation responsible provider: Eden Emms MD, Theron Arista.  INR POC: 2.4.  Exp: 12/2011.    Anticoagulation Management Assessment/Plan:      The patient's current anticoagulation dose is Warfarin sodium 2.5 mg tabs: Use as directed by Anticoagualtion Clinic.  The target INR is 2.5 - 3.5.  The next INR is due 12/17/2010.  Anticoagulation instructions were given to patient.  Results were reviewed/authorized by [name], PharmD.         Prior Anticoagulation Instructions: INR 2.0 Resume 3.75mg s daily except 2.5mg s on Wednesdays and Saturdays to get her back to normal range 2.5-3.5.   Current Anticoagulation Instructions: INR 2.4  Coumadin 2.5 mg tablets - Take 2 tablets today, then resume 1.5 tablets  every day except 1 tablet on Wednesdays and Saturdays.

## 2010-12-17 NOTE — Medication Information (Signed)
Summary: rov/tm  Anticoagulant Therapy  Managed by: Eda Keys, PharmD Referring MD: Charlton Haws MD PCP: Neena Rhymes MD Supervising MD: Daleen Squibb MD, Maisie Fus Indication 1: Mitral Valve Replacement (ICD-V43.3) Indication 2: TIA (ICD-435.9) Lab Used: [facility] [place] Site: [address] INR POC 3.5 INR RANGE 2.5 - 3.5  Dietary changes: no    Health status changes: no    Bleeding/hemorrhagic complications: no    Recent/future hospitalizations: no    Any changes in medication regimen? no    Recent/future dental: no  Any missed doses?: no       Is patient compliant with meds? yes       Allergies: 1)  ! Bactrim (Sulfamethoxazole-Trimethoprim) 2)  ! Demerol (Meperidine Hcl) 3)  ! Heparin  Anticoagulation Management History:      The patient is taking warfarin and comes in today for a routine follow up visit.  Positive risk factors for bleeding include an age of 75 years or older, history of CVA/TIA, and presence of serious comorbidities.  The bleeding index is 'high risk'.  Positive CHADS2 values include History of CHF, History of HTN, Age > 33 years old, History of Diabetes, and Prior Stroke/CVA/TIA.  The start date was 08/21/2001.  Her last INR was 4.9 RATIO.  Anticoagulation responsible provider: Daleen Squibb MD, Maisie Fus.  INR POC: 3.5.  Cuvette Lot#: [id number].  Exp: 03/2011.    Anticoagulation Management Assessment/Plan:      The patient's current anticoagulation dose is Warfarin sodium 2.5 mg tabs: Use as directed by Anticoagualtion Clinic.  The target INR is 2.5 - 3.5.  The next INR is due 02/12/2010.  Anticoagulation instructions were given to patient.  Results were reviewed/authorized by Eda Keys, PharmD.  She was notified by Eda Keys.         Prior Anticoagulation Instructions: INR 5.4 Skip today on Tuesday take 1/2 pill. Then resume 1 1/2 pills everyday except 1 pill on Wednesdays and Saturdays. Recheck in 10 days.   Current Anticoagulation  Instructions: INR 3.5  Continue current dosing schedule.  Take 1 tablet on Wednesday and Saturday and 1.5 tablets all other days.  Return to clinic in 3 weeks.

## 2010-12-17 NOTE — Medication Information (Signed)
Summary: rov/eac  Anticoagulant Therapy  Managed by: Cloyde Reams, RN, BSN Referring MD: Charlton Haws MD PCP: Neena Rhymes MD Supervising MD: Daleen Squibb MD, Maisie Fus Indication 1: Mitral Valve Replacement (ICD-V43.3) Indication 2: TIA (ICD-435.9) Lab Used: [facility] [place] Site: [address] INR POC 3.2 INR RANGE 2.5 - 3.5  Dietary changes: no    Health status changes: no    Bleeding/hemorrhagic complications: yes       Details: Pt fell at home, hit her head, went to hospital had CT-neg.  Has Lg bruise on L arm.  Recent/future hospitalizations: no    Any changes in medication regimen? no    Recent/future dental: no  Any missed doses?: no       Is patient compliant with meds? yes       Allergies: 1)  ! Bactrim (Sulfamethoxazole-Trimethoprim) 2)  ! Demerol (Meperidine Hcl) 3)  ! Heparin  Anticoagulation Management History:      The patient is taking warfarin and comes in today for a routine follow up visit.  Positive risk factors for bleeding include an age of 75 years or older, history of CVA/TIA, and presence of serious comorbidities.  The bleeding index is 'high risk'.  Positive CHADS2 values include History of CHF, History of HTN, Age > 13 years old, History of Diabetes, and Prior Stroke/CVA/TIA.  The start date was 08/21/2001.  Her last INR was 4.9 RATIO.  Anticoagulation responsible provider: Daleen Squibb MD, Maisie Fus.  INR POC: 3.2.  Cuvette Lot#: [id number].  Exp: 03/2011.    Anticoagulation Management Assessment/Plan:      The patient's current anticoagulation dose is Warfarin sodium 2.5 mg tabs: Use as directed by Anticoagualtion Clinic.  The target INR is 2.5 - 3.5.  The next INR is due 03/12/2010.  Anticoagulation instructions were given to patient.  Results were reviewed/authorized by Cloyde Reams, RN, BSN.  She was notified by Cloyde Reams RN.         Prior Anticoagulation Instructions: INR 3.5  Continue current dosing schedule.  Take 1 tablet on  Wednesday and Saturday and 1.5 tablets all other days.  Return to clinic in 3 weeks.    Current Anticoagulation Instructions: INR 3.2  Continue on same dosage 3.75mg  daily except 2.5mg  on Wednesdays and Saturdays.  Recheck in 4 weeks.

## 2010-12-17 NOTE — Assessment & Plan Note (Signed)
Summary: rov  f/u pulmonary edema./cy   Visit Type:  Follow-up Primary Provider:  Neena Rhymes MD  CC:  f/u pulmonary edema.  History of Present Illness: Kelsey Cochran has a history of mechanical mitral valve replacement, TV repair   LIMA to LAD and  maze procedure. This was done at [facility] in 2005 by Dr Silvestre Mesi.   She has switched  to generic warfarin.  I told her this would be fine but we may have to follow her INR more closely.  Her INR was a bit high  She knows to take SBE prophylaxis and will see the dentist next week.  Is not injected chest pain palpitations PND or orthopnea.  No bleeding diathesis.  She has known carotid disease on the right-hand side.  We thought it was fairly significant but CT angiography graded it at only 35%.  She had an evaluation by Dr. Edilia Bo who did not feel surgery was indicated. Her duplex was reviewed and showed stable 60-79% RICA disease.   She has been having some exertional dyspnea but no overt signs of failure  Denies fever cough sputum PND or othopnea  He last echo showed elevate PA pressures in the 85 mmHg range.  She has had long standing pulmonary hypertension that preceeded her MVR/TVR.  She does have exertional dyspnea and would probably benefit from Rx with Tracleer or revatio  Current Problems (verified): 1)  Dyspnea  (ICD-786.05) 2)  Hip Fracture, Left  (ICD-820.8) 3)  Postmenopausal Status  (ICD-V49.81) 4)  Vitamin D Deficiency  (ICD-268.9) 5)  Hip Pain  (ICD-719.45) 6)  Low Blood Pressure  (ICD-458.9) 7)  Weight Loss  (ICD-783.21) 8)  Fatigue  (ICD-780.79) 9)  Uns Advrs Eff Uns Rx Medicinal&biological Sbstnc  (ICD-995.20) 10)  Anemia  (ICD-285.9) 11)  Carotid Occlusive Disease  (ICD-433.10) 12)  Diab W/unspec Comp Type Ii/unspec Type Uncntrl  (ICD-250.92) 13)  Hypercholesterolemia  (ICD-272.0) 14)  Essential Hypertension, Benign  (ICD-401.1) 15)  Tia  (ICD-435.9) 16)  Chronic Diastolic Heart Failure  (ICD-428.32) 17)  Atrial Fibrillation   (ICD-427.31) 18)  Mitral Valve Replacement, Hx of  (ICD-V15.1)  Current Medications (verified): 1)  Lasix 80 Mg Tabs (Furosemide) .Marland Kitchen.. 1 Tab Bid 2)  Aspirin Adult Low Strength 81 Mg Tbec (Aspirin) .Marland Kitchen.. 1 Tab By Mouth Once Daily 3)  Folic Acid 800 Mcg Tabs (Folic Acid) .... Take One Tablet Daily 4)  Lipitor 20 Mg Tabs (Atorvastatin Calcium) .... Take One Tablet At Bedtime 5)  Ferrous Sulfate 325 (65 Fe) Mg Tabs (Ferrous Sulfate) .... Take Two Times A Day 6)  Aranesp (Albumin Free) 60 Mcg/ml Soln (Darbepoetin Alfa-Polysorbate) .... As Needed 7)  Imdur 30 Mg Xr24h-Tab (Isosorbide Mononitrate) .Marland Kitchen.. 1 Tab Daily 8)  Colace 100 Mg Caps (Docusate Sodium) .Marland Kitchen.. 1 Tab Two Times A Day 9)  Glyburide 5 Mg Tabs (Glyburide) .Marland Kitchen.. 1 Tab Am and 1/2 Tab Pm 10)  Coreg 25 Mg Tabs (Carvedilol) .... Take One Tablet Two Times A Day 11)  Warfarin Sodium 2.5 Mg Tabs (Warfarin Sodium) .... Use As Directed By Anticoagualtion Clinic 12)  Robaxin 500 Mg Tabs (Methocarbamol) .Marland Kitchen.. 1 Tab Two Times A Day 13)  Vicodin 5-500 Mg Tabs (Hydrocodone-Acetaminophen) .... Take One Tablet Three Times A Day As Needed 14)  Fosamax 35 Mg Tabs (Alendronate Sodium) .... Take One Tablet Weekly 15)  Es Tylenol .... As Needed 16)  Tylenol Pm .... As Needed 17)  Aldactone 25 Mg Tabs (Spironolactone) .Marland Kitchen.. 1 Once Daily  Allergies: 1)  !  Bactrim (Sulfamethoxazole-Trimethoprim) 2)  ! Demerol (Meperidine Hcl) 3)  ! Heparin  Past History:  Past Medical History: Last updated: 10/07/2008 PAF/Flutter:  EPS procedure 2003 aborted ablation with left side flutter MVR [facility] [id number] CHF diastolic Hypertension PUlmonary hypertension Arthritis TIA Diabetes Hypercholesterolemia Anemia CRF  Past Surgical History: Last updated: 06/11/2009 Arthroscopy:  07/06/2007 right knee [place] MVR with TV annuloplasty [facility] 04/2004 Rotator Cuff: 02/24/2006 [place] Hysterectomy L hip fx and repair 6/10  Family History: Last updated:  09/11/2008 Negative FH of Diabetes, Hypertension, or Coronary Artery Disease  Social History: Last updated: 10/28/2008 Widowed  3 Children daughter with Lupus Non smoker non drinker  Review of Systems       Denies fever, malais, weight loss, blurry vision, decreased visual acuity, cough, sputum,  hemoptysis, pleuritic pain, palpitaitons, heartburn, abdominal pain, melena, lower extremity edema, claudication, or rash.   Vital Signs:  Patient profile:   77 year old female Height:      65 inches Weight:      181.13 pounds BMI:     30.25 Pulse rate:   63 / minute Pulse rhythm:   regular Resp:     18 per minute BP sitting:   120 / 64  (left arm) Cuff size:   large  Vitals Entered By: Vikki Ports (July 17, 2010 11:03 AM)  Physical Exam  General:  Affect appropriate Healthy:  appears stated age HEENT: normal Neck supple with no adenopathy JVP normal no bruits no thyromegaly Lungs clear with no wheezing and good diaphragmatic motion Heart:  S1 click /S2 no murmur,rub, gallop or click PMI normal Abdomen: benighn, BS positve, no tenderness, no AAA no bruit.  No HSM or HJR Distal pulses intact with no bruits No edema Neuro non-focal Skin warm and dry    Impression & Recommendations:  Problem # 1:  DYSPNEA (ICD-786.05)  Long standing pulmonary HTN likely secondary to valvular heart diseae.  MVR/TVR fucntioning well.  Try revatio and F/U PA estimate by echo Her updated medication list for this problem includes:    Lasix 80 Mg Tabs (Furosemide) .Marland Kitchen... 1 tab bid    Aspirin Adult Low Strength 81 Mg Tbec (Aspirin) .Marland Kitchen... 1 tab by mouth once daily    Coreg 25 Mg Tabs (Carvedilol) .Marland Kitchen... Take one tablet two times a day    Aldactone 25 Mg Tabs (Spironolactone) .Marland Kitchen... 1 once daily  Orders: Echocardiogram (Echo)  Problem # 2:  CAROTID OCCLUSIVE DISEASE (ICD-433.10) F/U duplex in 6 months.  Not critical by CTAngio  Has seen Cari Caraway in consult Her updated medication  list for this problem includes:    Aspirin Adult Low Strength 81 Mg Tbec (Aspirin) .Marland Kitchen... 1 tab by mouth once daily    Warfarin Sodium 2.5 Mg Tabs (Warfarin sodium) ..... Use as directed by anticoagualtion clinic  Problem # 3:  HYPERCHOLESTEROLEMIA (ICD-272.0) Continue statin  low chol diet Her updated medication list for this problem includes:    Lipitor 20 Mg Tabs (Atorvastatin calcium) .Marland Kitchen... Take one tablet at bedtime  CHOL: 157 (03/10/2010)   LDL: 80 (03/10/2010)   HDL: 46.50 (03/10/2010)   TG: 152.0 (03/10/2010)  Problem # 4:  ESSENTIAL HYPERTENSION, BENIGN (ICD-401.1) Well contorlled Her updated medication list for this problem includes:    Lasix 80 Mg Tabs (Furosemide) .Marland Kitchen... 1 tab bid    Aspirin Adult Low Strength 81 Mg Tbec (Aspirin) .Marland Kitchen... 1 tab by mouth once daily    Coreg 25 Mg Tabs (Carvedilol) .Marland Kitchen... Take one tablet two  times a day    Aldactone 25 Mg Tabs (Spironolactone) .Marland Kitchen... 1 once daily  Problem # 5:  MITRAL VALVE REPLACEMENT, HX OF (ICD-V15.1) Normal exam and normal by echo.  Continue anticoagulation with generic coumadin  Problem # 6:  ATRIAL FIBRILLATION (ICD-427.31) Left side flutter with maze at time of surgery.  ECG wth NSR  continue BB and warfarin Her updated medication list for this problem includes:    Aspirin Adult Low Strength 81 Mg Tbec (Aspirin) .Marland Kitchen... 1 tab by mouth once daily    Coreg 25 Mg Tabs (Carvedilol) .Marland Kitchen... Take one tablet two times a day    Warfarin Sodium 2.5 Mg Tabs (Warfarin sodium) ..... Use as directed by anticoagualtion clinic  Other Orders: EKG w/ Interpretation (93000)  Patient Instructions: 1)  Your physician recommends that you schedule a follow-up appointment in: 3 months with Dr. Eden Emms and echo same day 2)  Your physician has recommended you make the following change in your medication: Start Revatio 20 mg by mouth three times a day 3)  Your physician has requested that you have an echocardiogram.  Echocardiography is a painless  test that uses sound waves to create images of your heart. It provides your doctor with information about the size and shape of your heart and how well your heart's chambers and valves are working.  This procedure takes approximately one hour. There are no restrictions for this procedure. To be done in 3 months Prescriptions: REVATIO 20 MG TABS (SILDENAFIL CITRATE) take one tablet by mouth three times a day  #90 x 6   Entered by:   Dossie Arbour, RN, BSN   Authorized by:   Colon Branch, MD, [facility]   Signed by:   Dossie Arbour, RN, BSN on 07/17/2010   Method used:   Electronically to        [facility] [phone]* (retail)       [address]       [place], [place]  [address]       Ph: [phone]       Fax: [phone]   RxID:   [id number]    EKG Report  Procedure date:  07/17/2010  Findings:      NSR 63 RAD Low voltage Borderline ECG

## 2010-12-17 NOTE — Medication Information (Signed)
Summary: rov/sel  Anticoagulant Therapy  Managed by: [name], PharmD Referring MD: Charlton Haws MD PCP: Neena Rhymes MD Supervising MD: Gala Romney MD, Reuel Boom Indication 1: Mitral Valve Replacement (ICD-V43.3) Indication 2: TIA (ICD-435.9) Lab Used: [facility] [place] Site: [address] INR POC 3.0 INR RANGE 2.5 - 3.5  Dietary changes: no    Health status changes: no    Bleeding/hemorrhagic complications: no    Recent/future hospitalizations: no    Any changes in medication regimen? no    Recent/future dental: no  Any missed doses?: no       Is patient compliant with meds? yes       Allergies: 1)  ! Bactrim (Sulfamethoxazole-Trimethoprim) 2)  ! Demerol (Meperidine Hcl) 3)  ! Heparin  Anticoagulation Management History:      The patient is taking warfarin and comes in today for a routine follow up visit.  Positive risk factors for bleeding include an age of 75 years or older, history of CVA/TIA, and presence of serious comorbidities.  The bleeding index is 'high risk'.  Positive CHADS2 values include History of CHF, History of HTN, Age > 58 years old, History of Diabetes, and Prior Stroke/CVA/TIA.  The start date was 08/21/2001.  Her last INR was 4.9 RATIO.  Anticoagulation responsible provider: Kenon Delashmit MD, Reuel Boom.  INR POC: 3.0.  Cuvette Lot#: [id number].  Exp: 09/2011.    Anticoagulation Management Assessment/Plan:      The patient's current anticoagulation dose is Warfarin sodium 2.5 mg tabs: Use as directed by Anticoagualtion Clinic.  The target INR is 2.5 - 3.5.  The next INR is due 09/17/2010.  Anticoagulation instructions were given to patient.  Results were reviewed/authorized by [name], PharmD.  She was notified by Ilean Skill D candidate.         Prior Anticoagulation Instructions: INR 2.8  Continue taking 1 1/2 tablets every day except take 1 tablet on Wednesdays and Saturdays. Re-check INR in 4 weeks.   Current Anticoagulation  Instructions: INR 3.0  Continue taking 1 1/2 tablets everyday except 1 tablet on Wednesday and Saturday. Recheck in 4 weeks.

## 2010-12-17 NOTE — Progress Notes (Signed)
Summary: echo results  Phone Note Call from Patient Call back at [phone]   Caller: Patient Summary of Call: echo results Initial call taken by: Judie Grieve,  June 05, 2010 11:07 AM  Follow-up for Phone Call        PT INFORMED TESTS HAVE NOT BEEN REVIEWED AT THIS TIME. WILL ONCE MD READS . Follow-up by: Scherrie Bateman, LPN,  June 05, 2010 11:52 AM  Additional Follow-up for Phone Call Additional follow up Details #1::        echo and MVR fine.  See append from CXR and labs Additional Follow-up by: Colon Branch, MD, [facility],  June 05, 2010 12:34 PM     Appended Document: echo results pt aware of lab cxr and echo results.

## 2010-12-17 NOTE — Assessment & Plan Note (Signed)
Summary: ROV/F/U HEMATOMA/DM   Primary Provider:  Neena Rhymes MD  CC:  sob..pt states her hemoglobin is low.  History of Present Illness: Kelsey Cochran is seen today for F/U of dyspnea, anticoagulaiton and MVR.  She had a significant LUE muscle tear creating a huge hematoma in her arm about 10 days ago.  She was evaluated by Dr Juanda Chance who advised INR of 2.0-2.5.  She has received som FFP to bring INR down and is to get it rechecked today.  There has been no evidence of compartment syndrome but she still has lots of swelling and echymosis.  She has significant anemia with Hct 24 that is contributing to her fatigue and dyspnea.  I reviewed her echo from 12/11 and EF normal with normal functioning MVR no evidence of perivalvular leak or other abnormality that could contribute to anemia via shearing effect/hemolysis.  She apparantly has had Aranasp shots and F/U with [name] but I don't have these notes.  She needs blood work today to furhter look into this anemia and expedited F/U with hematology.  Heart seems fine.  Discussed with [name] in coumadin clinic to run INR 2-2.5 for at least another week.  No evidence of gi blood loss.    Current Problems (verified): 1)  Dyspnea  (ICD-786.05) 2)  Hip Fracture, Left  (ICD-820.8) 3)  Postmenopausal Status  (ICD-V49.81) 4)  Vitamin D Deficiency  (ICD-268.9) 5)  Hip Pain  (ICD-719.45) 6)  Low Blood Pressure  (ICD-458.9) 7)  Weight Loss  (ICD-783.21) 8)  Fatigue  (ICD-780.79) 9)  Uns Advrs Eff Uns Rx Medicinal&biological Sbstnc  (ICD-995.20) 10)  Anemia  (ICD-285.9) 11)  Carotid Occlusive Disease  (ICD-433.10) 12)  Diab W/unspec Comp Type Ii/unspec Type Uncntrl  (ICD-250.92) 13)  Hypercholesterolemia  (ICD-272.0) 14)  Essential Hypertension, Benign  (ICD-401.1) 15)  Tia  (ICD-435.9) 16)  Chronic Diastolic Heart Failure  (ICD-428.32) 17)  Atrial Fibrillation  (ICD-427.31) 18)  Mitral Valve Replacement, Hx of  (ICD-V15.1)  Current Medications  (verified): 1)  Lasix 80 Mg Tabs (Furosemide) .Marland Kitchen.. 1 Tab Bid 2)  Aspirin Adult Low Strength 81 Mg Tbec (Aspirin) .Marland Kitchen.. 1 Tab By Mouth Once Daily 3)  Folic Acid 800 Mcg Tabs (Folic Acid) .... Take One Tablet Daily 4)  Lipitor 20 Mg Tabs (Atorvastatin Calcium) .... Take One Tablet At Bedtime 5)  Ferrous Sulfate 325 (65 Fe) Mg Tabs (Ferrous Sulfate) .... Take Two Times A Day 6)  Aranesp (Albumin Free) 60 Mcg/ml Soln (Darbepoetin Alfa-Polysorbate) .... As Needed 7)  Imdur 30 Mg Xr24h-Tab (Isosorbide Mononitrate) .Marland Kitchen.. 1 Tab Daily 8)  Colace 100 Mg Caps (Docusate Sodium) .Marland Kitchen.. 1 Tab Two Times A Day 9)  Glyburide 5 Mg Tabs (Glyburide) .Marland Kitchen.. 1 Tab Am and 1 Tab Pm 10)  Coreg 25 Mg Tabs (Carvedilol) .... Take One Tablet Two Times A Day 11)  Warfarin Sodium 2.5 Mg Tabs (Warfarin Sodium) .... Use As Directed By Anticoagualtion Clinic 12)  Robaxin 500 Mg Tabs (Methocarbamol) .... As Needed 13)  Vicodin 5-500 Mg Tabs (Hydrocodone-Acetaminophen) .... Take One Tablet Three Times A Day As Needed 14)  Fosamax 35 Mg Tabs (Alendronate Sodium) .... Take One Tablet Weekly 15)  Es Tylenol .... As Needed 16)  Tylenol Pm .... As Needed 17)  Aldactone 25 Mg Tabs (Spironolactone) .Marland Kitchen.. 1 Once Daily 18)  Truetest Test  Strp (Glucose Blood) .... Test Once Daily  Allergies (verified): 1)  ! Bactrim (Sulfamethoxazole-Trimethoprim) 2)  ! Demerol (Meperidine Hcl) 3)  ! Heparin  Past History:  Past Medical History: Last updated: 10/07/2008 PAF/Flutter:  EPS procedure 2003 aborted ablation with left side flutter MVR Cox [id number] CHF diastolic Hypertension PUlmonary hypertension Arthritis TIA Diabetes Hypercholesterolemia Anemia CRF  Past Surgical History: Last updated: 06/11/2009 Arthroscopy:  07/06/2007 right knee [place] MVR with TV annuloplasty [facility] 04/2004 Rotator Cuff: 02/24/2006 [place] Hysterectomy L hip fx and repair 6/10  Family History: Last updated: 09/11/2008 Negative FH of Diabetes,  Hypertension, or Coronary Artery Disease  Social History: Last updated: 10/28/2008 Widowed  3 Children daughter with Lupus Non smoker non drinker  Review of Systems       Denies fever, malais, weight loss, blurry vision, decreased visual acuity, cough, sputum,  hemoptysis, pleuritic pain, palpitaitons, heartburn, abdominal pain, melena, lower extremity edema, claudication, or rash.   Vital Signs:  Patient profile:   77 year old female Height:      65 inches Weight:      186 pounds BMI:     31.06 Pulse rate:   69 / minute Resp:     16 per minute BP sitting:   118 / 69  (left arm)  Vitals Entered By: Kem Parkinson (November 04, 2010 11:12 AM)  Physical Exam  General:  Affect appropriate Healthy:  appears stated age HEENT: normal Neck supple with no adenopathy JVP normal no bruits no thyromegaly Lungs clear with no wheezing and good diaphragmatic motion Heart:  S1 clic normal /S2 no murmur,rub, gallop or click PMI normal Abdomen: benighn, BS positve, no tenderness, no AAA no bruit.  No HSM or HJR Distal pulses intact with no bruits No edema Neuro non-focal Skin warm and dry RUE with resolving hematoma and good radial pulse   Impression & Recommendations:  Problem # 1:  DYSPNEA (ICD-786.05) Echo normal EF and MVR funciton  normal lung exam.  Likely due to anemia Her updated medication list for this problem includes:    Lasix 80 Mg Tabs (Furosemide) .Marland Kitchen... 1 tab bid    Aspirin Adult Low Strength 81 Mg Tbec (Aspirin) .Marland Kitchen... 1 tab by mouth once daily    Coreg 25 Mg Tabs (Carvedilol) .Marland Kitchen... Take one tablet two times a day    Aldactone 25 Mg Tabs (Spironolactone) .Marland Kitchen... 1 once daily  Problem # 2:  ANEMIA (ICD-285.9) Labs to look at hemolysis and BM funciton  F/U [name].   Orders: T-Reticulocyte Count, Automated ([id number]) T-Ferritin [phone]) Augusto Gamble [phone]) T-Haptoglobin quan [phone]) TLB-Hemoglobin (Hgb) (85018-HGB) TLB-Hematocrit (Hct)  (85013-HCT) T- * Misc. Laboratory test [phone]) T-Iron Binding Capacity (TIBC) ([address]) T-LDH [phone])  Problem # 3:  HYPERCHOLESTEROLEMIA (ICD-272.0) At goal with no side effects Her updated medication list for this problem includes:    Lipitor 20 Mg Tabs (Atorvastatin calcium) .Marland Kitchen... Take one tablet at bedtime  CHOL: 190 (09/29/2010)   LDL: 80 (03/10/2010)   HDL: 38.70 (09/29/2010)   TG: 222.0 (09/29/2010)  Problem # 4:  ESSENTIAL HYPERTENSION, BENIGN (ICD-401.1) Well controlled Her updated medication list for this problem includes:    Lasix 80 Mg Tabs (Furosemide) .Marland Kitchen... 1 tab bid    Aspirin Adult Low Strength 81 Mg Tbec (Aspirin) .Marland Kitchen... 1 tab by mouth once daily    Coreg 25 Mg Tabs (Carvedilol) .Marland Kitchen... Take one tablet two times a day    Aldactone 25 Mg Tabs (Spironolactone) .Marland Kitchen... 1 once daily  Problem # 5:  MITRAL VALVE REPLACEMENT, HX OF (ICD-V15.1) Normal valve function by echo and normal exam.  Will run INR 2-2.5 for another week while RUE hematoma resolves  Patient Instructions: 1)  Your physician recommends that you schedule a follow-up appointment in: 6 -8 weeks with Dr. Eden Emms 2)  Your physician recommends that you  have  lab work today: Retic count, FE, TIBC, Ferritin, Haptoglobin, peripheral smear for shistocytes, hb, hct 3)  Your physician recommends that you continue on your current medications as directed. Please refer to the Current Medication list given to you today.

## 2010-12-17 NOTE — Medication Information (Signed)
Summary: rov/ewj  Anticoagulant Therapy  Managed by: [name], PharmD Referring MD: Charlton Haws MD PCP: Neena Rhymes MD Supervising MD: Eden Emms MD, Theron Arista Indication 1: Mitral Valve Replacement (ICD-V43.3) Indication 2: TIA (ICD-435.9) Lab Used: [facility] [place] Site: [address] INR POC 3.2 INR RANGE 2.5 - 3.5  Dietary changes: no    Health status changes: no    Bleeding/hemorrhagic complications: yes       Details: has large bruise on R arm  Recent/future hospitalizations: no    Any changes in medication regimen? no    Recent/future dental: no  Any missed doses?: no       Is patient compliant with meds? yes       Allergies: 1)  ! Bactrim (Sulfamethoxazole-Trimethoprim) 2)  ! Demerol (Meperidine Hcl) 3)  ! Heparin  Anticoagulation Management History:      The patient is taking warfarin and comes in today for a routine follow up visit.  Positive risk factors for bleeding include an age of 27 years or older, history of CVA/TIA, and presence of serious comorbidities.  The bleeding index is 'high risk'.  Positive CHADS2 values include History of CHF, History of HTN, Age > 55 years old, History of Diabetes, and Prior Stroke/CVA/TIA.  The start date was 08/21/2001.  Her last INR was 4.9 RATIO.  Anticoagulation responsible provider: Eden Emms MD, Theron Arista.  INR POC: 3.2.  Cuvette Lot#: [id number].  Exp: 10/2011.    Anticoagulation Management Assessment/Plan:      The patient's current anticoagulation dose is Warfarin sodium 2.5 mg tabs: Use as directed by Anticoagualtion Clinic.  The target INR is 2.5 - 3.5.  The next INR is due 11/19/2010.  Anticoagulation instructions were given to patient.  Results were reviewed/authorized by [name], PharmD.  She was notified by [name] PharmD.         Prior Anticoagulation Instructions: INR 3.8  Take 1/2 tablet today, then resume same dosage 1.5 tablets daily except 1 tablet on Wednesdays and Saturdays.  Recheck in 4  weeks.    Current Anticoagulation Instructions: INR 3.2  Continue same dose of 1 1/2 tablets every day except 1 tablet on Wednesday and Saturday.  Recheck INR in 4 weeks.

## 2010-12-17 NOTE — Medication Information (Signed)
Summary: rov/tm  Anticoagulant Therapy  Managed by: Leota Sauers, PharmD, BCPS, CPP Referring MD: Charlton Haws MD PCP: Neena Rhymes MD Supervising MD: Shirlee Latch MD, Freida Busman Indication 1: Mitral Valve Replacement (ICD-V43.3) Indication 2: TIA (ICD-435.9) Lab Used: [facility] [place] Site: [address] INR POC 3.0 INR RANGE 2-2.5  Dietary changes: no    Health status changes: no    Bleeding/hemorrhagic complications: yes    Recent/future hospitalizations: no    Any changes in medication regimen? no    Recent/future dental: no  Any missed doses?: no       Is patient compliant with meds? yes      Comments: despite valve - keep INR 2-2.5 becuse of huge right arm hematoma - f/u for any change in range in future after healling  Current Medications (verified): 1)  Lasix 80 Mg Tabs (Furosemide) .Marland Kitchen.. 1 Tab Bid 2)  Aspirin Adult Low Strength 81 Mg Tbec (Aspirin) .Marland Kitchen.. 1 Tab By Mouth Once Daily 3)  Folic Acid 800 Mcg Tabs (Folic Acid) .... Take One Tablet Daily 4)  Lipitor 20 Mg Tabs (Atorvastatin Calcium) .... Take One Tablet At Bedtime 5)  Ferrous Sulfate 325 (65 Fe) Mg Tabs (Ferrous Sulfate) .... Take Two Times A Day 6)  Aranesp (Albumin Free) 60 Mcg/ml Soln (Darbepoetin Alfa-Polysorbate) .... As Needed 7)  Imdur 30 Mg Xr24h-Tab (Isosorbide Mononitrate) .Marland Kitchen.. 1 Tab Daily 8)  Colace 100 Mg Caps (Docusate Sodium) .Marland Kitchen.. 1 Tab Two Times A Day 9)  Glyburide 5 Mg Tabs (Glyburide) .Marland Kitchen.. 1 Tab Am and 1 Tab Pm 10)  Coreg 25 Mg Tabs (Carvedilol) .... Take One Tablet Two Times A Day 11)  Warfarin Sodium 2.5 Mg Tabs (Warfarin Sodium) .... Use As Directed By Anticoagualtion Clinic 12)  Robaxin 500 Mg Tabs (Methocarbamol) .... As Needed 13)  Vicodin 5-500 Mg Tabs (Hydrocodone-Acetaminophen) .... Take One Tablet Three Times A Day As Needed 14)  Fosamax 35 Mg Tabs (Alendronate Sodium) .... Take One Tablet Weekly 15)  Es Tylenol .... As Needed 16)  Tylenol Pm .... As Needed 17)   Aldactone 25 Mg Tabs (Spironolactone) .Marland Kitchen.. 1 Once Daily 18)  Truetest Test  Strp (Glucose Blood) .... Test Once Daily  Allergies (verified): 1)  ! Bactrim (Sulfamethoxazole-Trimethoprim) 2)  ! Demerol (Meperidine Hcl) 3)  ! Heparin  Anticoagulation Management History:      The patient is taking warfarin and comes in today for a routine follow up visit.  Positive risk factors for bleeding include an age of 75 years or older, history of CVA/TIA, and presence of serious comorbidities.  The bleeding index is 'high risk'.  Positive CHADS2 values include History of CHF, History of HTN, Age > 22 years old, History of Diabetes, and Prior Stroke/CVA/TIA.  The start date was 08/21/2001.  Her last INR was 4.9 RATIO.  Anticoagulation responsible Bocephus Cali: Shirlee Latch MD, [place].  INR POC: 3.0.  Cuvette Lot#: [id number].  Exp: 11/2011.    Anticoagulation Management Assessment/Plan:      The patient's current anticoagulation dose is Warfarin sodium 2.5 mg tabs: Use as directed by Anticoagualtion Clinic.  The target INR is 2.5 - 3.5.  The next INR is due 11/12/2010.  Anticoagulation instructions were given to patient.  Results were reviewed/authorized by Leota Sauers, PharmD, BCPS, CPP.         Prior Anticoagulation Instructions: INR 2.3 Change dose temporary to 1.5 pills everyday except 1 pill on Mondays, Wednesdays and Saturdays. Recheck in one week.   Current Anticoagulation Instructions:  INR 3.0  NO COUMADIN TODAY 12/21  Coumadin 2.5mg  tabs - take 1.5 tab on MON, WED, FRI and 1 tab all other days

## 2010-12-17 NOTE — Letter (Signed)
   [place] at [facility] [address] [place], [place]  [address] Phone: [phone]      January 08, 2010   [facility] [place] [address] [place], [place] [address]  RE:  LAB RESULTS  Dear  Ms. Mccarthy,  The following is an interpretation of your most recent lab tests.  Please take note of any instructions provided or changes to medications that have resulted from your lab work.    DIABETIC STUDIES:  Good - no changes needed Blood Glucose: 113   HgbA1C: 6.5

## 2010-12-17 NOTE — Medication Information (Signed)
Summary: rov/sp  Anticoagulant Therapy  Managed by: Minerva Areola, RN Referring MD: Charlton Haws MD PCP: Neena Rhymes MD Supervising MD: Myrtis Ser MD, Tinnie Gens Indication 1: Mitral Valve Replacement (ICD-V43.3) Indication 2: TIA (ICD-435.9) Lab Used: [facility] [facility] [place] Site: [address] INR POC 3.5 INR RANGE 2.5 - 3.5  Dietary changes: no    Health status changes: no    Bleeding/hemorrhagic complications: no    Recent/future hospitalizations: no    Any changes in medication regimen? yes       Details: Vitamin D 50,000 units weekly; fosamax 35mg  weekly both added  Recent/future dental: no  Any missed doses?: no       Is patient compliant with meds? yes       Current Medications (verified): 1)  Lasix 80 Mg Tabs (Furosemide) .Marland Kitchen.. 1 Tab Bid 2)  Aspirin Adult Low Strength 81 Mg Tbec (Aspirin) .Marland Kitchen.. 1 Tab By Mouth Once Daily 3)  Folic Acid 800 Mcg Tabs (Folic Acid) .... Take One Tablet Daily 4)  Lipitor 20 Mg Tabs (Atorvastatin Calcium) .... Take One Tablet At Bedtime 5)  Ferrous Sulfate 325 (65 Fe) Mg Tabs (Ferrous Sulfate) .... Take Two Times A Day 6)  Aranesp (Albumin Free) 60 Mcg/ml Soln (Darbepoetin Alfa-Polysorbate) .... As Needed 7)  Imdur 30 Mg Xr24h-Tab (Isosorbide Mononitrate) .Marland Kitchen.. 1 Tab Daily 8)  Colace 100 Mg Caps (Docusate Sodium) .Marland Kitchen.. 1 Tab Two Times A Day 9)  Glyburide 5 Mg Tabs (Glyburide) .... Take One Tablet Two Times A Day 10)  Coreg 25 Mg Tabs (Carvedilol) .... Take One Tablet Two Times A Day 11)  Klor-Con M10 10 Meq Cr-Tabs (Potassium Chloride Crys Cr) .... 5 Tablets Two Times A Day 12)  Warfarin Sodium 2.5 Mg Tabs (Warfarin Sodium) .... Use As Directed By Anticoagualtion Clinic 13)  Robaxin 500 Mg Tabs (Methocarbamol) .Marland Kitchen.. 1 Tab Two Times A Day 14)  Vicodin 5-500 Mg Tabs (Hydrocodone-Acetaminophen) .... Take One Tablet Three Times A Day As Needed 15)  Ergocalciferol 50000 Unit Caps (Ergocalciferol) .Marland Kitchen.. 1 By Mouth Weekly For 12 Weeks  Then Recheck Vit D Level 16)  Fosamax 35 Mg Tabs (Alendronate Sodium) .... Take One Tablet Weekly  Allergies (verified): 1)  ! Bactrim (Sulfamethoxazole-Trimethoprim) 2)  ! Demerol (Meperidine Hcl) 3)  ! Heparin  Anticoagulation Management History:      The patient is taking warfarin and comes in today for a routine follow up visit.  Positive risk factors for bleeding include an age of 7 years or older, history of CVA/TIA, and presence of serious comorbidities.  The bleeding index is 'high risk'.  Positive CHADS2 values include History of CHF, History of HTN, Age > 74 years old, History of Diabetes, and Prior Stroke/CVA/TIA.  The start date was 08/21/2001.  Her last INR was 4.9 RATIO.  Anticoagulation responsible provider: Myrtis Ser MD, Tinnie Gens.  INR POC: 3.5.  Cuvette Lot#: [id number].  Exp: 06/2011.    Anticoagulation Management Assessment/Plan:      The patient's current anticoagulation dose is Warfarin sodium 2.5 mg tabs: Use as directed by Anticoagualtion Clinic.  The target INR is 2.5 - 3.5.  The next INR is due 05/07/2010.  Anticoagulation instructions were given to patient.  Results were reviewed/authorized by Minerva Areola, RN.  She was notified by Minerva Areola, RN.         Prior Anticoagulation Instructions: INR 2.6  Continue same dose of 1 1/2 tablets every day except 1 tablet on Wednesday and Saturday   Current Anticoagulation Instructions:  The patient is to continue with the same dose of coumadin.  This dosage includes: 1 1/2 tablets every day except one tablet on Wednesdays and Saturdays.

## 2010-12-17 NOTE — Letter (Signed)
[facility]   Imported By: Lanelle Bal 12/05/2009 11:45:36  _____________________________________________________________________  External Attachment:    Type:   Image     Comment:   External Document

## 2010-12-17 NOTE — Medication Information (Signed)
Summary: rov/tm  Anticoagulant Therapy  Managed by: Bethena Midget, RN, BSN Referring MD: Charlton Haws MD PCP: Neena Rhymes MD Supervising MD: Tenny Craw MD, Gunnar Fusi Indication 1: Mitral Valve Replacement (ICD-V43.3) Indication 2: TIA (ICD-435.9) Lab Used: [facility] Site: [address] INR POC 5.4 INR RANGE 2.5 - 3.5  Dietary changes: no    Health status changes: yes       Details: Had GI bug last week.   Bleeding/hemorrhagic complications: no    Recent/future hospitalizations: no    Any changes in medication regimen? no    Recent/future dental: no  Any missed doses?: no       Is patient compliant with meds? yes       Allergies: 1)  ! Bactrim (Sulfamethoxazole-Trimethoprim) 2)  ! Demerol (Meperidine Hcl) 3)  ! Heparin  Anticoagulation Management History:      The patient is taking warfarin and comes in today for a routine follow up visit.  Positive risk factors for bleeding include an age of 26 years or older, history of CVA/TIA, and presence of serious comorbidities.  The bleeding index is 'high risk'.  Positive CHADS2 values include History of CHF, History of HTN, Age > 25 years old, History of Diabetes, and Prior Stroke/CVA/TIA.  The start date was 08/21/2001.  Her last INR was 4.9 RATIO.  Anticoagulation responsible provider: Tenny Craw MD, Gunnar Fusi.  INR POC: 5.4.  Cuvette Lot#: [id number].  Exp: 02/2011.    Anticoagulation Management Assessment/Plan:      The patient's current anticoagulation dose is Warfarin sodium 2.5 mg tabs: Use as directed by Anticoagualtion Clinic.  The target INR is 2.5 - 3.5.  The next INR is due 01/22/2010.  Anticoagulation instructions were given to patient.  Results were reviewed/authorized by Bethena Midget, RN, BSN.  She was notified by Bethena Midget, RN, BSN.         Prior Anticoagulation Instructions: INR 3.2 Continue 3.75mg s everyday except 2.5mg s on Wednesdays and Saturdays. Recheck in  3 weeks.   Current Anticoagulation  Instructions: INR 5.4 Skip today and Tuesday take 1/2 pill then resume 1 1/2 pill everyday except 1 pill on Wednesdays. Recheck in 10 days.   Appended Document: [facility]    Anticoagulant Therapy  Managed by: Bethena Midget, RN, BSN Referring MD: Charlton Haws MD PCP: Neena Rhymes MD Supervising MD: Tenny Craw MD, Gunnar Fusi Indication 1: Mitral Valve Replacement (ICD-V43.3) Indication 2: TIA (ICD-435.9) Lab Used: [facility] [place] Site: [address] INR RANGE 2.5 - 3.5           Allergies: 1)  ! Bactrim (Sulfamethoxazole-Trimethoprim) 2)  ! Demerol (Meperidine Hcl) 3)  ! Heparin  Anticoagulation Management History:      Positive risk factors for bleeding include an age of 68 years or older, history of CVA/TIA, and presence of serious comorbidities.  The bleeding index is 'high risk'.  Positive CHADS2 values include History of CHF, History of HTN, Age > 34 years old, History of Diabetes, and Prior Stroke/CVA/TIA.  The start date was 08/21/2001.  Her last INR was 4.9 RATIO.  Anticoagulation responsible provider: Tenny Craw MD, Gunnar Fusi.  Exp: 02/2011.    Anticoagulation Management Assessment/Plan:      The patient's current anticoagulation dose is Warfarin sodium 2.5 mg tabs: Use as directed by Anticoagualtion Clinic.  The target INR is 2.5 - 3.5.  The next INR is due 01/22/2010.  Anticoagulation instructions were given to patient.  Results were reviewed/authorized by Bethena Midget, RN, BSN.  She was notified by Bethena Midget, RN, BSN.         Prior Anticoagulation Instructions: INR 5.4 Skip today and Tuesday take 1/2 pill then resume 1 1/2 pill everyday except 1 pill on Wednesdays. Recheck in 10 days.   Current Anticoagulation Instructions: INR 5.4 Skip today on Tuesday take 1/2 pill. Then resume 1 1/2 pills everyday except 1 pill on Wednesdays and Saturdays. Recheck in 10 days.

## 2010-12-17 NOTE — Progress Notes (Addendum)
  Phone Note From Other Clinic   Summary of Call: spoke wi  [name]; Pt has bicep hematoma; her INR 3.5  SHe has mechanical MV .  Plan was to change her warfarin from 3.75 to 2.5 today and fri and then return to normal regime.  She is to stop ASA;  he will give her FFP  she will need INR next week Initial call taken by: Nathen May, MD, [facility],  October 22, 2010 12:20 PM

## 2010-12-17 NOTE — Letter (Signed)
Summary: [facility]  [facility]   Imported By: Lanelle Bal 07/30/2010 09:57:44  _____________________________________________________________________  External Attachment:    Type:   Image     Comment:   External Document

## 2010-12-17 NOTE — Medication Information (Signed)
Summary: rov/ewj  Anticoagulant Therapy  Managed by: [name], PharmD Referring MD: Charlton Haws MD PCP: Neena Rhymes MD Supervising MD: Myrtis Ser MD, Tinnie Gens Indication 1: Mitral Valve Replacement (ICD-V43.3) Indication 2: TIA (ICD-435.9) Lab Used: [facility] [place] Site: [address] INR POC 2.6 INR RANGE 2.5 - 3.5  Dietary changes: no    Health status changes: no    Bleeding/hemorrhagic complications: no    Recent/future hospitalizations: no    Any changes in medication regimen? no    Recent/future dental: no  Any missed doses?: no       Is patient compliant with meds? yes       Allergies: 1)  ! Bactrim (Sulfamethoxazole-Trimethoprim) 2)  ! Demerol (Meperidine Hcl) 3)  ! Heparin  Anticoagulation Management History:      The patient is taking warfarin and comes in today for a routine follow up visit.  Positive risk factors for bleeding include an age of 75 years or older, history of CVA/TIA, and presence of serious comorbidities.  The bleeding index is 'high risk'.  Positive CHADS2 values include History of CHF, History of HTN, Age > 75 years old, History of Diabetes, and Prior Stroke/CVA/TIA.  The start date was 08/21/2001.  Her last INR was 4.9 RATIO.  Anticoagulation responsible provider: Myrtis Ser MD, Tinnie Gens.  INR POC: 2.6.  Cuvette Lot#: [id number].  Exp: 04/2011.    Anticoagulation Management Assessment/Plan:      The patient's current anticoagulation dose is Warfarin sodium 2.5 mg tabs: Use as directed by Anticoagualtion Clinic.  The target INR is 2.5 - 3.5.  The next INR is due 04/09/2010.  Anticoagulation instructions were given to patient.  Results were reviewed/authorized by [name], PharmD.  She was notified by [name] PharmD.         Prior Anticoagulation Instructions: INR 3.2  Continue on same dosage 3.75mg  daily except 2.5mg  on Wednesdays and Saturdays.  Recheck in 4 weeks.    Current Anticoagulation Instructions: INR 2.6  Continue  same dose of 1 1/2 tablets every day except 1 tablet on Wednesday and Saturday

## 2010-12-17 NOTE — Medication Information (Signed)
Summary: RX Clarification  RX Clarification   Imported By: Kassie Mends 02/05/2010 10:06:18  _____________________________________________________________________  External Attachment:    Type:   Image     Comment:   External Document

## 2010-12-17 NOTE — Letter (Signed)
Summary: Handout Printed  Printed Handout:  - Coumadin Instructions-w/out Meds 

## 2010-12-17 NOTE — Progress Notes (Signed)
Summary: labs  Phone Note Outgoing Call   Call placed by: Doristine Devoid CMA,  October 01, 2010 11:55 AM Call placed to: Patient Summary of Call: A1C has jumped from 6.4 --> 7.1.  will not make med changes at this time but needs to pay close attention to diet.  will repeat in 3 months  LDL has increased from 80-->120.  again, won't change meds at this time but needs to watch diet carefully.  if still elevated when we recheck in 6 months will adjust meds at that time.  Triglycerides have also increased from 152 --> 222, indicating this is mostly diet related.   Follow-up for Phone Call        left message on machine ........Marland KitchenDoristine Devoid CMA  October 01, 2010 11:55 AM   Additional Follow-up for Phone Call Additional follow up Details #1::        Patient notified. Additional Follow-up by: Lucious Groves CMA,  October 01, 2010 3:26 PM

## 2010-12-22 ENCOUNTER — Encounter: Payer: Self-pay | Admitting: Family Medicine

## 2010-12-22 ENCOUNTER — Telehealth (INDEPENDENT_AMBULATORY_CARE_PROVIDER_SITE_OTHER): Payer: Self-pay | Admitting: *Deleted

## 2010-12-22 ENCOUNTER — Ambulatory Visit: Payer: Medicare PPO | Admitting: Physical Therapy

## 2010-12-23 NOTE — Medication Information (Signed)
Summary: rov/sp  Anticoagulant Therapy  Managed by: [name], PharmD Referring MD: Charlton Haws MD PCP: Neena Rhymes MD Supervising MD: Shirlee Latch MD, Freida Busman Indication 1: Mitral Valve Replacement (ICD-V43.3) Indication 2: TIA (ICD-435.9) Lab Used: [facility] Site: [address] INR POC 3.5 INR RANGE 2.5-3.5  Dietary changes: no    Health status changes: no    Bleeding/hemorrhagic complications: no    Recent/future hospitalizations: no    Any changes in medication regimen? no    Recent/future dental: no  Any missed doses?: no       Is patient compliant with meds? yes       Allergies: 1)  ! Bactrim (Sulfamethoxazole-Trimethoprim) 2)  ! Demerol (Meperidine Hcl) 3)  ! Heparin  Anticoagulation Management History:      The patient is taking warfarin and comes in today for a routine follow up visit.  Positive risk factors for bleeding include an age of 75 years or older, history of CVA/TIA, and presence of serious comorbidities.  The bleeding index is 'high risk'.  Positive CHADS2 values include History of CHF, History of HTN, Age > 65 years old, History of Diabetes, and Prior Stroke/CVA/TIA.  The start date was 08/21/2001.  Her last INR was 4.9 RATIO.  Anticoagulation responsible provider: Shirlee Latch MD, Tajanay Hurley.  INR POC: 3.5.  Cuvette Lot#: [id number].  Exp: 11/2011.    Anticoagulation Management Assessment/Plan:      The patient's current anticoagulation dose is Warfarin sodium 2.5 mg tabs: Use as directed by Anticoagualtion Clinic.  The target INR is 2.5 - 3.5.  The next INR is due 01/12/2011.  Anticoagulation instructions were given to patient.  Results were reviewed/authorized by [name], PharmD.  She was notified by [name] PharmD.         Prior Anticoagulation Instructions: INR 2.4  Coumadin 2.5 mg tablets - Take 2 tablets today, then resume 1.5 tablets every day except 1 tablet on Wednesdays and Saturdays.   Current Anticoagulation  Instructions: INR 3.5  Continue same dose of 3.75mg  every day except 2.5mg  on Wednesday and Saturday.  Recheck INR in 4 weeks.

## 2010-12-23 NOTE — Progress Notes (Signed)
Summary: needs to ask about PT  Phone Note Call from Patient Call back at Home Phone [phone]   Caller: Patient Summary of Call: patient called and spoke to [facility] about PT appt---Renee says she doesnt schedule this patients type of PT appts and that patient needed to talk to Dr Rennis Golden nurse---patient has appt for Wed 2/1 and needs to talk to [facility] as soon as possible Initial call taken by: Jerolyn Shin,  December 14, 2010 12:58 PM  Follow-up for Phone Call        spoke w/ patient having a hard time w/ insurance company would like to go do rehab at [facility] has appt for wed. but would like to get something resolved or else she would have to cancel appt since she can't afford it unless paid for by insurance........Marland KitchenDoristine Devoid CMA  December 14, 2010 3:10 PM   [phone]  [facility] [phone]  called [facility] and spoke w/ Johnny Bridge about patient's questions and was informed that there is nothing that we need to do they have information that is to be completed by them and faxed to [employer] to determine how many visit patient can be approved for spoke w/ patient and informed her of above information....Marland KitchenMarland KitchenDoristine Devoid CMA  December 14, 2010 3:29 PM

## 2010-12-23 NOTE — Assessment & Plan Note (Signed)
Summary: f6-8w/dfg   Primary Provider:  Neena Rhymes MD  CC:  follow up.  History of Present Illness: Kelsey Cochran is seen today for F/U of dyspnea, anticoagulaiton and MVR and carotid disease.  Reviewed duplex from today and 60-79% RICA stenosis  stable. .  She has significant anemia with Hct 24 that is contributing to her fatigue and dyspnea.  I reviewed her echo from 12/11 and EF normal with normal functioning MVR no evidence of perivalvular leak or other abnormality that could contribute to anemia via shearing effect/hemolysis.  She apparantly has had Aranasp shots and F/U with [name] but I don't have these notes.  She needs blood work today to furhter look into this anemia and expedited F/U with hematology.  Heart seems fine.  Current Problems (verified): 1)  Dyspnea  (ICD-786.05) 2)  Hip Fracture, Left  (ICD-820.8) 3)  Postmenopausal Status  (ICD-V49.81) 4)  Vitamin D Deficiency  (ICD-268.9) 5)  Hip Pain  (ICD-719.45) 6)  Low Blood Pressure  (ICD-458.9) 7)  Weight Loss  (ICD-783.21) 8)  Fatigue  (ICD-780.79) 9)  Uns Advrs Eff Uns Rx Medicinal&biological Sbstnc  (ICD-995.20) 10)  Anemia  (ICD-285.9) 11)  Carotid Occlusive Disease  (ICD-433.10) 12)  Diab W/unspec Comp Type Ii/unspec Type Uncntrl  (ICD-250.92) 13)  Hypercholesterolemia  (ICD-272.0) 14)  Essential Hypertension, Benign  (ICD-401.1) 15)  Tia  (ICD-435.9) 16)  Chronic Diastolic Heart Failure  (ICD-428.32) 17)  Atrial Fibrillation  (ICD-427.31) 18)  Mitral Valve Replacement, Hx of  (ICD-V15.1)  Current Medications (verified): 1)  Lasix 80 Mg Tabs (Furosemide) .Marland Kitchen.. 1 Tab Bid 2)  Aspirin Adult Low Strength 81 Mg Tbec (Aspirin) .Marland Kitchen.. 1 Tab By Mouth Once Daily 3)  Folic Acid 800 Mcg Tabs (Folic Acid) .... Take One Tablet Daily 4)  Lipitor 20 Mg Tabs (Atorvastatin Calcium) .... Take One Tablet At Bedtime 5)  Ferrous Sulfate 325 (65 Fe) Mg Tabs (Ferrous Sulfate) .... Take Two Times A Day 6)  Aranesp (Albumin Free) 60  Mcg/ml Soln (Darbepoetin Alfa-Polysorbate) .... As Needed 7)  Imdur 30 Mg Xr24h-Tab (Isosorbide Mononitrate) .Marland Kitchen.. 1 Tab Daily 8)  Colace 100 Mg Caps (Docusate Sodium) .Marland Kitchen.. 1 Tab Two Times A Day 9)  Glyburide 5 Mg Tabs (Glyburide) .Marland Kitchen.. 1 Tab Am and 1 Tab Pm 10)  Coreg 25 Mg Tabs (Carvedilol) .... Take One Tablet Two Times A Day 11)  Warfarin Sodium 2.5 Mg Tabs (Warfarin Sodium) .... Use As Directed By Anticoagualtion Clinic 12)  Robaxin 500 Mg Tabs (Methocarbamol) .... As Needed 13)  Vicodin 5-500 Mg Tabs (Hydrocodone-Acetaminophen) .... Take One Tablet Three Times A Day As Needed 14)  Fosamax 35 Mg Tabs (Alendronate Sodium) .... Take One Tablet Weekly 15)  Es Tylenol .... As Needed 16)  Tylenol Pm .... As Needed 17)  Aldactone 25 Mg Tabs (Spironolactone) .Marland Kitchen.. 1 Once Daily 18)  Truetest Test  Strp (Glucose Blood) .... Test Once Daily  Allergies (verified): 1)  ! Bactrim (Sulfamethoxazole-Trimethoprim) 2)  ! Demerol (Meperidine Hcl) 3)  ! Heparin  Past History:  Past Medical History: Last updated: 10/07/2008 PAF/Flutter:  EPS procedure 2003 aborted ablation with left side flutter MVR [facility] [id number] CHF diastolic Hypertension PUlmonary hypertension Arthritis TIA Diabetes Hypercholesterolemia Anemia CRF  Past Surgical History: Last updated: 06/11/2009 Arthroscopy:  07/06/2007 right knee [place] MVR with TV annuloplasty [facility] 04/2004 Rotator Cuff: 02/24/2006 [place] Hysterectomy L hip fx and repair 6/10  Family History: Last updated: 09/11/2008 Negative FH of Diabetes, Hypertension, or Coronary Artery Disease  Social  History: Last updated: 10/28/2008 Widowed  3 Children daughter with Lupus Non smoker non drinker  Review of Systems       Denies fever, malais, weight loss, blurry vision, decreased visual acuity, cough, sputum, SOB, hemoptysis, pleuritic pain, palpitaitons, heartburn, abdominal pain, melena, lower extremity edema, claudication, or rash.   Vital  Signs:  Patient profile:   77 year old female Height:      65 inches Weight:      184 pounds BMI:     30.73 Pulse rate:   72 / minute Resp:     16 per minute BP sitting:   120 / 73  (right arm)  Vitals Entered By: Kem Parkinson (December 15, 2010 8:55 AM)  Physical Exam  General:  Affect appropriate Healthy:  appears stated age HEENT: normal Neck supple with no adenopathy JVP normal no bruits no thyromegaly Lungs clear with no wheezing and good diaphragmatic motion Heart:  S1 click /S2 no murmur,rub, gallop or click PMI normal Abdomen: benighn, BS positve, no tenderness, no AAA no bruit.  No HSM or HJR Distal pulses intact with no bruits trace bilateral  edema Neuro non-focal Skin warm and dry    Impression & Recommendations:  Problem # 1:  CAROTID OCCLUSIVE DISEASE (ICD-433.10) Stable F/U duplex 6 months 60-79% RICA.  Continiue baby asa Her updated medication list for this problem includes:    Aspirin Adult Low Strength 81 Mg Tbec (Aspirin) .Marland Kitchen... 1 tab by mouth once daily    Warfarin Sodium 2.5 Mg Tabs (Warfarin sodium) ..... Use as directed by anticoagualtion clinic  Problem # 2:  HYPERCHOLESTEROLEMIA (ICD-272.0) At goal continue statin Her updated medication list for this problem includes:    Lipitor 20 Mg Tabs (Atorvastatin calcium) .Marland Kitchen... Take one tablet at bedtime  CHOL: 190 (09/29/2010)   LDL: 80 (03/10/2010)   HDL: 38.70 (09/29/2010)   TG: 222.0 (09/29/2010)  Problem # 3:  ATRIAL FIBRILLATION (ICD-427.31) Stable continue anticoagulaiton and warfarin Her updated medication list for this problem includes:    Aspirin Adult Low Strength 81 Mg Tbec (Aspirin) .Marland Kitchen... 1 tab by mouth once daily    Coreg 25 Mg Tabs (Carvedilol) .Marland Kitchen... Take one tablet two times a day    Warfarin Sodium 2.5 Mg Tabs (Warfarin sodium) ..... Use as directed by anticoagualtion clinic  Problem # 4:  MITRAL VALVE REPLACEMENT, HX OF (ICD-V15.1) Normal exam and recent echo.  SBE and  encouraged her to see dentist

## 2010-12-24 ENCOUNTER — Encounter: Payer: Medicare PPO | Admitting: Physical Therapy

## 2010-12-25 ENCOUNTER — Ambulatory Visit: Payer: Medicare PPO | Admitting: Physical Therapy

## 2010-12-28 DIAGNOSIS — I059 Rheumatic mitral valve disease, unspecified: Secondary | ICD-10-CM

## 2010-12-28 DIAGNOSIS — Z9889 Other specified postprocedural states: Secondary | ICD-10-CM

## 2010-12-28 DIAGNOSIS — I4891 Unspecified atrial fibrillation: Secondary | ICD-10-CM

## 2010-12-29 ENCOUNTER — Ambulatory Visit: Payer: Medicare PPO | Admitting: Physical Therapy

## 2010-12-31 ENCOUNTER — Ambulatory Visit: Payer: Medicare PPO | Admitting: Physical Therapy

## 2010-12-31 NOTE — Letter (Addendum)
Summary: [facility]  [facility]   Imported By: Maryln Gottron 12/25/2010 12:34:07  _____________________________________________________________________  External Attachment:    Type:   Image     Comment:   External Document

## 2010-12-31 NOTE — Progress Notes (Signed)
Summary: hydrocodone / apap  refill  Phone Note Refill Request Message from:  Fax from Pharmacy on December 22, 2010 3:45 PM  Refills Requested: Medication #1:  VICODIN 5-500 MG TABS take one tablet three times a day as needed [facility]  [address]  [place], [place]  phone [phone]   fax - [phone]    qty = 60  Next Appointment Scheduled: Fri 2/17   [place] Initial call taken by: Jerolyn Shin,  December 22, 2010 3:48 PM    Prescriptions: VICODIN 5-500 MG TABS (HYDROCODONE-ACETAMINOPHEN) take one tablet three times a day as needed  #60 x 0   Entered by:   Doristine Devoid CMA   Authorized by:   Neena Rhymes MD   Signed by:   Doristine Devoid CMA on 12/23/2010   Method used:   Printed then faxed to ...       [facility] [address] [id number]* (retail)       [address]       [place], [place]  [address]       Ph: [phone]       Fax: [phone]   RxID:   [id number]

## 2011-01-01 ENCOUNTER — Ambulatory Visit: Payer: Self-pay | Admitting: Family Medicine

## 2011-01-04 ENCOUNTER — Ambulatory Visit (INDEPENDENT_AMBULATORY_CARE_PROVIDER_SITE_OTHER): Payer: Medicare PPO | Admitting: Family Medicine

## 2011-01-04 ENCOUNTER — Encounter: Payer: Self-pay | Admitting: Family Medicine

## 2011-01-04 ENCOUNTER — Other Ambulatory Visit: Payer: Self-pay | Admitting: Family Medicine

## 2011-01-04 DIAGNOSIS — I10 Essential (primary) hypertension: Secondary | ICD-10-CM

## 2011-01-04 DIAGNOSIS — M66329 Spontaneous rupture of flexor tendons, unspecified upper arm: Secondary | ICD-10-CM | POA: Insufficient documentation

## 2011-01-04 DIAGNOSIS — E118 Type 2 diabetes mellitus with unspecified complications: Secondary | ICD-10-CM

## 2011-01-04 DIAGNOSIS — IMO0002 Reserved for concepts with insufficient information to code with codable children: Secondary | ICD-10-CM

## 2011-01-04 DIAGNOSIS — E1165 Type 2 diabetes mellitus with hyperglycemia: Secondary | ICD-10-CM

## 2011-01-04 LAB — BASIC METABOLIC PANEL
Calcium: 8.6 mg/dL (ref 8.4–10.5)
GFR: 61.17 mL/min (ref >60.00)
Glucose, Bld: 184 mg/dL — ABNORMAL HIGH (ref 70–99)
Potassium: 3.9 mEq/L (ref 3.5–5.1)
Sodium: 136 mEq/L (ref 135–145)

## 2011-01-04 LAB — HEMOGLOBIN A1C: Hgb A1c MFr Bld: 7.3 % — ABNORMAL HIGH (ref 4.6–6.5)

## 2011-01-05 ENCOUNTER — Ambulatory Visit: Payer: Medicare PPO | Admitting: Physical Therapy

## 2011-01-12 ENCOUNTER — Encounter: Payer: Self-pay | Admitting: Cardiovascular Disease

## 2011-01-12 ENCOUNTER — Ambulatory Visit: Payer: Medicare PPO | Admitting: Physical Therapy

## 2011-01-12 ENCOUNTER — Encounter (INDEPENDENT_AMBULATORY_CARE_PROVIDER_SITE_OTHER): Payer: Medicare PPO

## 2011-01-12 DIAGNOSIS — Z954 Presence of other heart-valve replacement: Secondary | ICD-10-CM

## 2011-01-12 DIAGNOSIS — Z7901 Long term (current) use of anticoagulants: Secondary | ICD-10-CM

## 2011-01-12 DIAGNOSIS — G459 Transient cerebral ischemic attack, unspecified: Secondary | ICD-10-CM

## 2011-01-12 NOTE — Assessment & Plan Note (Signed)
Summary: rto 3 mo-CBS---lmom to call back and resch this appt///sph   Vital Signs:  Patient profile:   77 year old female Weight:      178 pounds BMI:     29.73 Pulse rate:   72 / minute BP sitting:   110 / 78  (left arm)  Vitals Entered By: Doristine Devoid CMA (January 04, 2011 1:07 PM) CC: diabetes f/u    History of Present Illness: 77 yo woman here today for  dm- CBGs better than previous.  has limited sugar intake.  this AM 130.  no symptomatic lows, dizziness, CP, SOB, HAs, visual changes, edema.  torn R biceps- tore this in December getting out of bed.  had large hematoma due to coumadin use and compartment syndrome.  still painful and swollen.  due to blood loss restarted Aranesp injxns ([name]).  received 2 units plasma.  HTN- BP excellently controlled today.  asymptomatic.  Preventive Screening-Counseling & Management  Caffeine-Diet-Exercise     Does Patient Exercise: yes     Type of exercise: PT     Times/week: <3  Current Medications (verified): 1)  Lasix 80 Mg Tabs (Furosemide) .Marland Kitchen.. 1 Tab Bid 2)  Aspirin Adult Low Strength 81 Mg Tbec (Aspirin) .Marland Kitchen.. 1 Tab By Mouth Once Daily 3)  Folic Acid 800 Mcg Tabs (Folic Acid) .... Take One Tablet Daily 4)  Lipitor 20 Mg Tabs (Atorvastatin Calcium) .... Take One Tablet At Bedtime 5)  Ferrous Sulfate 325 (65 Fe) Mg Tabs (Ferrous Sulfate) .... Take Two Times A Day 6)  Aranesp (Albumin Free) 60 Mcg/ml Soln (Darbepoetin Alfa-Polysorbate) .... As Needed 7)  Imdur 30 Mg Xr24h-Tab (Isosorbide Mononitrate) .Marland Kitchen.. 1 Tab Daily 8)  Colace 100 Mg Caps (Docusate Sodium) .Marland Kitchen.. 1 Tab Two Times A Day 9)  Glyburide 5 Mg Tabs (Glyburide) .Marland Kitchen.. 1 Tab Am and 1 Tab Pm 10)  Coreg 25 Mg Tabs (Carvedilol) .... Take One Tablet Two Times A Day 11)  Warfarin Sodium 2.5 Mg Tabs (Warfarin Sodium) .... Use As Directed By Anticoagualtion Clinic 12)  Robaxin 500 Mg Tabs (Methocarbamol) .... As Needed 13)  Vicodin 5-500 Mg Tabs (Hydrocodone-Acetaminophen)  .... Take One Tablet Three Times A Day As Needed 14)  Fosamax 35 Mg Tabs (Alendronate Sodium) .... Take One Tablet Weekly 15)  Es Tylenol .... As Needed 16)  Tylenol Pm .... As Needed 17)  Aldactone 25 Mg Tabs (Spironolactone) .Marland Kitchen.. 1 Once Daily 18)  Truetest Test  Strp (Glucose Blood) .... Test Once Daily  Allergies (verified): 1)  ! Bactrim (Sulfamethoxazole-Trimethoprim) 2)  ! Demerol (Meperidine Hcl) 3)  ! Heparin  Past History:  Past medical, surgical, family and social histories (including risk factors) reviewed for relevance to current acute and chronic problems.  Past Medical History: Reviewed history from 10/07/2008 and no changes required. PAF/Flutter:  EPS procedure 2003 aborted ablation with left side flutter MVR Cox [id number] CHF diastolic Hypertension PUlmonary hypertension Arthritis TIA Diabetes Hypercholesterolemia Anemia CRF  Past Surgical History: Reviewed history from 06/11/2009 and no changes required. Arthroscopy:  07/06/2007 right knee [place] MVR with TV annuloplasty [facility] 04/2004 Rotator Cuff: 02/24/2006 Giofre Hysterectomy L hip fx and repair 6/10  Family History: Reviewed history from 09/11/2008 and no changes required. Negative FH of Diabetes, Hypertension, or Coronary Artery Disease  Social History: Reviewed history from 10/28/2008 and no changes required. Widowed  3 Children daughter with Lupus Non smoker non drinker Does Patient Exercise:  yes  Review of Systems  See HPI  Physical Exam  General:  Well-developed,well-nourished,in no acute distress; alert,appropriate and cooperative throughout examination Head:  Normocephalic and atraumatic without obvious abnormalities. Neck:  No deformities, masses, or tenderness noted. Lungs:  Normal respiratory effort, chest expands symmetrically. Lungs are clear to auscultation, no crackles or wheezes. Heart:  reg S1/S2, mitral click Abdomen:  soft, NT/ND, +BS Msk:  R upper arm w/  some edema and TTP, no hematoma present Pulses:  +2 radial, carotid, DP Extremities:  no C/C/E Neurologic:  ambulating well w/ cane Psych:  Cognition and judgment appear intact. Alert and cooperative with normal attention span and concentration. No apparent delusions, illusions, hallucinations   Impression & Recommendations:  Problem # 1:  DIAB W/UNSPEC COMP TYPE II/UNSPEC TYPE UNCNTRL (ICD-250.92) Assessment Unchanged check labs.  adjust meds as needed.  discussed starting ACE for renal protection.  pt leery of adding another med.  on multiple BP meds and has trouble tolerating various meds in past.  will not start at this time. Her updated medication list for this problem includes:    Aspirin Adult Low Strength 81 Mg Tbec (Aspirin) .Marland Kitchen... 1 tab by mouth once daily    Glyburide 5 Mg Tabs (Glyburide) .Marland Kitchen... 1 tab am and 1 tab pm  Orders: Venipuncture (81191) Specimen Handling (47829) TLB-BMP (Basic Metabolic Panel-BMET) (80048-METABOL) TLB-A1C / Hgb A1C (Glycohemoglobin) (83036-A1C)  Problem # 2:  ESSENTIAL HYPERTENSION, BENIGN (ICD-401.1) Assessment: Unchanged BP well controlled.  asymptomatic.  no changes. Her updated medication list for this problem includes:    Lasix 80 Mg Tabs (Furosemide) .Marland Kitchen... 1 tab bid    Coreg 25 Mg Tabs (Carvedilol) .Marland Kitchen... Take one tablet two times a day    Aldactone 25 Mg Tabs (Spironolactone) .Marland Kitchen... 1 once daily  Problem # 3:  BICEPS TENDON RUPTURE, RIGHT (ICD-727.62) Assessment: New still w/ some swelling and pain but has regained strength and motion.  doing very well.  Complete Medication List: 1)  Lasix 80 Mg Tabs (Furosemide) .Marland Kitchen.. 1 tab bid 2)  Aspirin Adult Low Strength 81 Mg Tbec (Aspirin) .Marland Kitchen.. 1 tab by mouth once daily 3)  Folic Acid 800 Mcg Tabs (Folic acid) .... Take one tablet daily 4)  Lipitor 20 Mg Tabs (Atorvastatin calcium) .... Take one tablet at bedtime 5)  Ferrous Sulfate 325 (65 Fe) Mg Tabs (Ferrous sulfate) .... Take two times a  day 6)  Aranesp (albumin Free) 60 Mcg/ml Soln (Darbepoetin alfa-polysorbate) .... As needed 7)  Imdur 30 Mg Xr24h-tab (Isosorbide mononitrate) .Marland Kitchen.. 1 tab daily 8)  Colace 100 Mg Caps (Docusate sodium) .Marland Kitchen.. 1 tab two times a day 9)  Glyburide 5 Mg Tabs (Glyburide) .Marland Kitchen.. 1 tab am and 1 tab pm 10)  Coreg 25 Mg Tabs (Carvedilol) .... Take one tablet two times a day 11)  Warfarin Sodium 2.5 Mg Tabs (Warfarin sodium) .... Use as directed by anticoagualtion clinic 12)  Robaxin 500 Mg Tabs (Methocarbamol) .... As needed 13)  Vicodin 5-500 Mg Tabs (Hydrocodone-acetaminophen) .... Take one tablet three times a day as needed 14)  Fosamax 35 Mg Tabs (Alendronate sodium) .... Take one tablet weekly 15)  Es Tylenol  .... As needed 16)  Tylenol Pm  .... As needed 17)  Aldactone 25 Mg Tabs (Spironolactone) .Marland Kitchen.. 1 once daily 18)  Truetest Test Strp (Glucose blood) .... Test once daily  Patient Instructions: 1)  Please schedule your complete physical in 3 months- do not eat before this appt 2)  Keep up the good work! 3)  Call with  any questions or concerns 4)  Hang in there!!!   Orders Added: 1)  Venipuncture [67893] 2)  Specimen Handling [99000] 3)  TLB-BMP (Basic Metabolic Panel-BMET) [80048-METABOL] 4)  TLB-A1C / Hgb A1C (Glycohemoglobin) [83036-A1C] 5)  Est. Patient Level IV [81017]

## 2011-01-12 NOTE — Progress Notes (Signed)
Summary: [facility]: Office Progress Note  [facility]: Office Progress Note   Imported By: Earl Many 01/06/2011 09:19:13  _____________________________________________________________________  External Attachment:    Type:   Image     Comment:   External Document

## 2011-01-14 ENCOUNTER — Ambulatory Visit: Payer: Medicare PPO | Attending: Orthopedic Surgery | Admitting: Physical Therapy

## 2011-01-14 DIAGNOSIS — M25559 Pain in unspecified hip: Secondary | ICD-10-CM | POA: Insufficient documentation

## 2011-01-14 DIAGNOSIS — R262 Difficulty in walking, not elsewhere classified: Secondary | ICD-10-CM | POA: Insufficient documentation

## 2011-01-14 DIAGNOSIS — IMO0001 Reserved for inherently not codable concepts without codable children: Secondary | ICD-10-CM | POA: Insufficient documentation

## 2011-01-19 ENCOUNTER — Ambulatory Visit: Payer: Medicare PPO | Admitting: Physical Therapy

## 2011-01-21 NOTE — Medication Information (Signed)
Summary: rov/kh  Anticoagulant Therapy  Managed by: Tammy Sours, PharmD Referring MD: Charlton Haws MD PCP: Neena Rhymes MD Supervising MD: Eden Emms MD, Theron Arista Indication 1: Mitral Valve Replacement (ICD-V43.3) Indication 2: TIA (ICD-435.9) Lab Used: [facility] [place] Site: [address] INR POC 3.5 INR RANGE 2.5-3.5  Dietary changes: no    Health status changes: no    Bleeding/hemorrhagic complications: no    Recent/future hospitalizations: no    Any changes in medication regimen? no    Recent/future dental: no  Any missed doses?: no       Is patient compliant with meds? yes       Allergies: 1)  ! Bactrim (Sulfamethoxazole-Trimethoprim) 2)  ! Demerol (Meperidine Hcl) 3)  ! Heparin  Anticoagulation Management History:      The patient is taking warfarin and comes in today for a routine follow up visit.  Positive risk factors for bleeding include an age of 3 years or older, history of CVA/TIA, and presence of serious comorbidities.  The bleeding index is 'high risk'.  Positive CHADS2 values include History of CHF, History of HTN, Age > 108 years old, History of Diabetes, and Prior Stroke/CVA/TIA.  The start date was 08/21/2001.  Her last INR was 4.9 RATIO.  Anticoagulation responsible provider: Eden Emms MD, Theron Arista.  INR POC: 3.5.  Cuvette Lot#: [id number].  Exp: 11/2011.    Anticoagulation Management Assessment/Plan:      The patient's current anticoagulation dose is Warfarin sodium 2.5 mg tabs: Use as directed by Anticoagualtion Clinic.  The target INR is 2.5 - 3.5.  The next INR is due 02/09/2011.  Anticoagulation instructions were given to patient.  Results were reviewed/authorized by Tammy Sours, PharmD.         Prior Anticoagulation Instructions: INR 3.5  Continue same dose of 3.75mg  every day except 2.5mg  on Wednesday and Saturday.  Recheck INR in 4 weeks.   Current Anticoagulation Instructions: INR 3.5  Continue same dose of 1 and 1/2 tablets  (3.75 mg) everyday except 1 tablet (2.5 mg) on Wednesdays and Saturdays. Recheck INR in 4 weeks.

## 2011-01-25 ENCOUNTER — Other Ambulatory Visit: Payer: Self-pay | Admitting: Family

## 2011-01-25 ENCOUNTER — Encounter (HOSPITAL_BASED_OUTPATIENT_CLINIC_OR_DEPARTMENT_OTHER): Payer: Medicare PPO | Admitting: Hematology & Oncology

## 2011-01-25 DIAGNOSIS — N289 Disorder of kidney and ureter, unspecified: Secondary | ICD-10-CM

## 2011-01-25 DIAGNOSIS — D649 Anemia, unspecified: Secondary | ICD-10-CM

## 2011-01-25 DIAGNOSIS — Z954 Presence of other heart-valve replacement: Secondary | ICD-10-CM

## 2011-01-25 DIAGNOSIS — D594 Other nonautoimmune hemolytic anemias: Secondary | ICD-10-CM

## 2011-01-25 DIAGNOSIS — Z7901 Long term (current) use of anticoagulants: Secondary | ICD-10-CM

## 2011-01-25 LAB — CBC WITH DIFFERENTIAL (CANCER CENTER ONLY)
EOS%: 3.4 % (ref 0.0–7.0)
Eosinophils Absolute: 0.3 10*3/uL (ref 0.0–0.5)
LYMPH%: 11.3 % — ABNORMAL LOW (ref 14.0–48.0)
MCH: 31.6 pg (ref 26.0–34.0)
MCHC: 33.7 g/dL (ref 32.0–36.0)
MCV: 94 fL (ref 81–101)
MONO%: 5.7 % (ref 0.0–13.0)
Platelets: 143 10*3/uL — ABNORMAL LOW (ref 145–400)
RDW: 13.8 % (ref 11.1–15.7)

## 2011-01-25 LAB — PREPARE FRESH FROZEN PLASMA: Unit division: 0

## 2011-01-25 LAB — PROTIME-INR (CHCC SATELLITE)
INR: 2.9 (ref 2.0–3.5)
Protime: 34.8 Seconds — ABNORMAL HIGH (ref 10.6–13.4)

## 2011-01-26 NOTE — Medication Information (Signed)
Summary: prescriptions  prescriptions   Imported By: Kassie Mends 01/20/2011 10:00:39  _____________________________________________________________________  External Attachment:    Type:   Image     Comment:   External Document

## 2011-01-28 ENCOUNTER — Ambulatory Visit: Payer: Medicare PPO | Admitting: Physical Therapy

## 2011-02-08 LAB — DIFFERENTIAL
Basophils Absolute: 0.1 10*3/uL (ref 0.0–0.1)
Lymphocytes Relative: 16 % (ref 12–46)
Lymphs Abs: 1 10*3/uL (ref 0.7–4.0)
Neutro Abs: 4.3 10*3/uL (ref 1.7–7.7)

## 2011-02-08 LAB — PROTIME-INR
INR: 3.21 — ABNORMAL HIGH (ref 0.00–1.49)
Prothrombin Time: 32.6 seconds — ABNORMAL HIGH (ref 11.6–15.2)

## 2011-02-08 LAB — CBC
Platelets: 127 10*3/uL — ABNORMAL LOW (ref 150–400)
RDW: 13.6 % (ref 11.5–15.5)
WBC: 5.9 10*3/uL (ref 4.0–10.5)

## 2011-02-09 ENCOUNTER — Ambulatory Visit (INDEPENDENT_AMBULATORY_CARE_PROVIDER_SITE_OTHER): Payer: Medicare PPO | Admitting: *Deleted

## 2011-02-09 DIAGNOSIS — I4891 Unspecified atrial fibrillation: Secondary | ICD-10-CM

## 2011-02-09 DIAGNOSIS — I059 Rheumatic mitral valve disease, unspecified: Secondary | ICD-10-CM

## 2011-02-09 DIAGNOSIS — Z9889 Other specified postprocedural states: Secondary | ICD-10-CM

## 2011-02-09 LAB — POCT INR: INR: 3.6

## 2011-02-09 NOTE — Patient Instructions (Signed)
INR 3.6 Return to clinic in 4 weeks Cont with current drug regimen

## 2011-02-15 ENCOUNTER — Other Ambulatory Visit: Payer: Self-pay | Admitting: *Deleted

## 2011-02-15 MED ORDER — METHOCARBAMOL 500 MG PO TABS: 500.0000 mg | ORAL_TABLET | Freq: Two times a day (BID) | ORAL | Status: DC

## 2011-02-15 NOTE — Telephone Encounter (Signed)
Appears to not have been given by our office. Please advise.

## 2011-02-15 NOTE — Telephone Encounter (Signed)
Ok for #60, no refills but please ask who gave this to her if it was not [place]

## 2011-02-15 NOTE — Telephone Encounter (Signed)
Noted thank you

## 2011-02-15 NOTE — Telephone Encounter (Signed)
Previously given by our office with sig: bid and was changed to prn by our office 10/2010.

## 2011-02-15 NOTE — Telephone Encounter (Signed)
This was done earlier, re-sent rx.

## 2011-02-17 ENCOUNTER — Telehealth: Payer: Self-pay | Admitting: *Deleted

## 2011-02-17 NOTE — Telephone Encounter (Signed)
PA in process awaiting response.

## 2011-02-22 LAB — DIFFERENTIAL
Eosinophils Absolute: 0.3 10*3/uL (ref 0.0–0.7)
Eosinophils Relative: 5 % (ref 0–5)
Lymphocytes Relative: 14 % (ref 12–46)
Lymphs Abs: 1 10*3/uL (ref 0.7–4.0)
Monocytes Absolute: 0.5 10*3/uL (ref 0.1–1.0)

## 2011-02-22 LAB — PROTIME-INR
INR: 1.6 — ABNORMAL HIGH (ref 0.00–1.49)
Prothrombin Time: 20.2 seconds — ABNORMAL HIGH (ref 11.6–15.2)
Prothrombin Time: 32.3 seconds — ABNORMAL HIGH (ref 11.6–15.2)

## 2011-02-22 LAB — GLUCOSE, CAPILLARY
Glucose-Capillary: 108 mg/dL — ABNORMAL HIGH (ref 70–99)
Glucose-Capillary: 114 mg/dL — ABNORMAL HIGH (ref 70–99)
Glucose-Capillary: 125 mg/dL — ABNORMAL HIGH (ref 70–99)
Glucose-Capillary: 127 mg/dL — ABNORMAL HIGH (ref 70–99)
Glucose-Capillary: 132 mg/dL — ABNORMAL HIGH (ref 70–99)
Glucose-Capillary: 147 mg/dL — ABNORMAL HIGH (ref 70–99)
Glucose-Capillary: 147 mg/dL — ABNORMAL HIGH (ref 70–99)
Glucose-Capillary: 154 mg/dL — ABNORMAL HIGH (ref 70–99)
Glucose-Capillary: 157 mg/dL — ABNORMAL HIGH (ref 70–99)
Glucose-Capillary: 164 mg/dL — ABNORMAL HIGH (ref 70–99)
Glucose-Capillary: 164 mg/dL — ABNORMAL HIGH (ref 70–99)
Glucose-Capillary: 170 mg/dL — ABNORMAL HIGH (ref 70–99)
Glucose-Capillary: 170 mg/dL — ABNORMAL HIGH (ref 70–99)
Glucose-Capillary: 180 mg/dL — ABNORMAL HIGH (ref 70–99)
Glucose-Capillary: 189 mg/dL — ABNORMAL HIGH (ref 70–99)
Glucose-Capillary: 202 mg/dL — ABNORMAL HIGH (ref 70–99)
Glucose-Capillary: 205 mg/dL — ABNORMAL HIGH (ref 70–99)
Glucose-Capillary: 61 mg/dL — ABNORMAL LOW (ref 70–99)
Glucose-Capillary: 62 mg/dL — ABNORMAL LOW (ref 70–99)
Glucose-Capillary: 72 mg/dL (ref 70–99)
Glucose-Capillary: 80 mg/dL (ref 70–99)
Glucose-Capillary: 93 mg/dL (ref 70–99)

## 2011-02-22 LAB — COMPREHENSIVE METABOLIC PANEL
ALT: 12 U/L (ref 0–35)
AST: 17 U/L (ref 0–37)
Albumin: 2.4 g/dL — ABNORMAL LOW (ref 3.5–5.2)
Calcium: 7.7 mg/dL — ABNORMAL LOW (ref 8.4–10.5)
Chloride: 105 mEq/L (ref 96–112)
Creatinine, Ser: 0.79 mg/dL (ref 0.4–1.2)
GFR calc Af Amer: >60 mL/min (ref >60)
Sodium: 142 mEq/L (ref 135–145)
Total Bilirubin: 1.4 mg/dL — ABNORMAL HIGH (ref 0.3–1.2)

## 2011-02-22 LAB — BASIC METABOLIC PANEL
BUN: 10 mg/dL (ref 6–23)
CO2: 25 mEq/L (ref 19–32)
CO2: 28 mEq/L (ref 19–32)
Calcium: 7.7 mg/dL — ABNORMAL LOW (ref 8.4–10.5)
Calcium: 7.7 mg/dL — ABNORMAL LOW (ref 8.4–10.5)
Chloride: 108 mEq/L (ref 96–112)
Creatinine, Ser: 0.78 mg/dL (ref 0.4–1.2)
Creatinine, Ser: 0.83 mg/dL (ref 0.4–1.2)
GFR calc Af Amer: >60 mL/min (ref >60)
GFR calc Af Amer: >60 mL/min (ref >60)
GFR calc non Af Amer: >60 mL/min (ref >60)
GFR calc non Af Amer: >60 mL/min (ref >60)
Glucose, Bld: 58 mg/dL — ABNORMAL LOW (ref 70–99)
Sodium: 142 mEq/L (ref 135–145)

## 2011-02-22 LAB — CBC
HCT: 25.3 % — ABNORMAL LOW (ref 36.0–46.0)
HCT: 26.9 % — ABNORMAL LOW (ref 36.0–46.0)
Hemoglobin: 8.7 g/dL — ABNORMAL LOW (ref 12.0–15.0)
MCHC: 34.3 g/dL (ref 30.0–36.0)
MCHC: 34.7 g/dL (ref 30.0–36.0)
MCV: 93.1 fL (ref 78.0–100.0)
MCV: 93.7 fL (ref 78.0–100.0)
MCV: 94.1 fL (ref 78.0–100.0)
Platelets: 94 10*3/uL — ABNORMAL LOW (ref 150–400)
RBC: 2.68 MIL/uL — ABNORMAL LOW (ref 3.87–5.11)
RBC: 2.82 MIL/uL — ABNORMAL LOW (ref 3.87–5.11)
RBC: 2.89 MIL/uL — ABNORMAL LOW (ref 3.87–5.11)
RBC: 3.02 MIL/uL — ABNORMAL LOW (ref 3.87–5.11)
RDW: 15.4 % (ref 11.5–15.5)
WBC: 6.8 10*3/uL (ref 4.0–10.5)
WBC: 7.6 10*3/uL (ref 4.0–10.5)

## 2011-02-22 LAB — URINALYSIS, ROUTINE W REFLEX MICROSCOPIC
Bilirubin Urine: NEGATIVE
Ketones, ur: NEGATIVE mg/dL
Nitrite: NEGATIVE
pH: 6.5 (ref 5.0–8.0)

## 2011-02-22 LAB — APTT
aPTT: 75 seconds — ABNORMAL HIGH (ref 24–37)
aPTT: 93 seconds — ABNORMAL HIGH (ref 24–37)

## 2011-02-22 LAB — URINE CULTURE
Colony Count: NO GROWTH
Culture: NO GROWTH

## 2011-02-22 NOTE — Telephone Encounter (Signed)
Prior auth approved until 02-19-12, pharmacy notified, approval letter scan to chart.

## 2011-02-23 LAB — GLUCOSE, CAPILLARY
Glucose-Capillary: 156 mg/dL — ABNORMAL HIGH (ref 70–99)
Glucose-Capillary: 159 mg/dL — ABNORMAL HIGH (ref 70–99)
Glucose-Capillary: 166 mg/dL — ABNORMAL HIGH (ref 70–99)
Glucose-Capillary: 175 mg/dL — ABNORMAL HIGH (ref 70–99)
Glucose-Capillary: 177 mg/dL — ABNORMAL HIGH (ref 70–99)
Glucose-Capillary: 186 mg/dL — ABNORMAL HIGH (ref 70–99)
Glucose-Capillary: 246 mg/dL — ABNORMAL HIGH (ref 70–99)

## 2011-02-23 LAB — URINALYSIS, MICROSCOPIC ONLY
Bilirubin Urine: NEGATIVE
Leukocytes, UA: NEGATIVE
Nitrite: NEGATIVE
Specific Gravity, Urine: 1.016 (ref 1.005–1.030)
pH: 5.5 (ref 5.0–8.0)

## 2011-02-23 LAB — CBC
HCT: 23.2 % — ABNORMAL LOW (ref 36.0–46.0)
HCT: 23.3 % — ABNORMAL LOW (ref 36.0–46.0)
HCT: 25.5 % — ABNORMAL LOW (ref 36.0–46.0)
HCT: 27.4 % — ABNORMAL LOW (ref 36.0–46.0)
HCT: 30.4 % — ABNORMAL LOW (ref 36.0–46.0)
HCT: 32.2 % — ABNORMAL LOW (ref 36.0–46.0)
Hemoglobin: 10.6 g/dL — ABNORMAL LOW (ref 12.0–15.0)
Hemoglobin: 10.7 g/dL — ABNORMAL LOW (ref 12.0–15.0)
Hemoglobin: 12.2 g/dL (ref 12.0–15.0)
Hemoglobin: 7.3 g/dL — CL (ref 12.0–15.0)
Hemoglobin: 8.1 g/dL — ABNORMAL LOW (ref 12.0–15.0)
Hemoglobin: 9.2 g/dL — ABNORMAL LOW (ref 12.0–15.0)
Hemoglobin: 9.4 g/dL — ABNORMAL LOW (ref 12.0–15.0)
MCHC: 34 g/dL (ref 30.0–36.0)
MCHC: 34.2 g/dL (ref 30.0–36.0)
MCHC: 34.2 g/dL (ref 30.0–36.0)
MCHC: 34.6 g/dL (ref 30.0–36.0)
MCHC: 34.8 g/dL (ref 30.0–36.0)
MCHC: 35.3 g/dL (ref 30.0–36.0)
MCV: 91 fL (ref 78.0–100.0)
MCV: 91.4 fL (ref 78.0–100.0)
MCV: 92.5 fL (ref 78.0–100.0)
MCV: 92.5 fL (ref 78.0–100.0)
MCV: 93 fL (ref 78.0–100.0)
MCV: 93.8 fL (ref 78.0–100.0)
MCV: 96.4 fL (ref 78.0–100.0)
Platelets: 67 10*3/uL — ABNORMAL LOW (ref 150–400)
Platelets: 83 10*3/uL — ABNORMAL LOW (ref 150–400)
RBC: 2.29 MIL/uL — ABNORMAL LOW (ref 3.87–5.11)
RBC: 2.48 MIL/uL — ABNORMAL LOW (ref 3.87–5.11)
RBC: 2.74 MIL/uL — ABNORMAL LOW (ref 3.87–5.11)
RBC: 2.9 MIL/uL — ABNORMAL LOW (ref 3.87–5.11)
RBC: 3.24 MIL/uL — ABNORMAL LOW (ref 3.87–5.11)
RBC: 3.28 MIL/uL — ABNORMAL LOW (ref 3.87–5.11)
RBC: 3.52 MIL/uL — ABNORMAL LOW (ref 3.87–5.11)
RBC: 3.74 MIL/uL — ABNORMAL LOW (ref 3.87–5.11)
RDW: 13.9 % (ref 11.5–15.5)
RDW: 15.6 % — ABNORMAL HIGH (ref 11.5–15.5)
WBC: 7.6 10*3/uL (ref 4.0–10.5)
WBC: 8 10*3/uL (ref 4.0–10.5)
WBC: 8.3 10*3/uL (ref 4.0–10.5)
WBC: 8.4 10*3/uL (ref 4.0–10.5)
WBC: 8.6 10*3/uL (ref 4.0–10.5)
WBC: 8.6 10*3/uL (ref 4.0–10.5)
WBC: 8.6 10*3/uL (ref 4.0–10.5)
WBC: 9.1 10*3/uL (ref 4.0–10.5)

## 2011-02-23 LAB — DIFFERENTIAL
Basophils Absolute: 0 10*3/uL (ref 0.0–0.1)
Basophils Relative: 0 % (ref 0–1)
Eosinophils Absolute: 0.3 10*3/uL (ref 0.0–0.7)
Eosinophils Relative: 3 % (ref 0–5)
Neutrophils Relative %: 83 % — ABNORMAL HIGH (ref 43–77)

## 2011-02-23 LAB — COMPREHENSIVE METABOLIC PANEL
ALT: 12 U/L (ref 0–35)
ALT: <8 U/L (ref 0–35)
AST: 21 U/L (ref 0–37)
Alkaline Phosphatase: 110 U/L (ref 39–117)
BUN: 9 mg/dL (ref 6–23)
CO2: 20 mEq/L (ref 19–32)
CO2: 26 mEq/L (ref 19–32)
Calcium: 6.6 mg/dL — ABNORMAL LOW (ref 8.4–10.5)
Calcium: 8.2 mg/dL — ABNORMAL LOW (ref 8.4–10.5)
Chloride: 105 mEq/L (ref 96–112)
Creatinine, Ser: 0.74 mg/dL (ref 0.4–1.2)
GFR calc non Af Amer: >60 mL/min (ref >60)
GFR calc non Af Amer: >60 mL/min (ref >60)
Glucose, Bld: 237 mg/dL — ABNORMAL HIGH (ref 70–99)
Glucose, Bld: 304 mg/dL — ABNORMAL HIGH (ref 70–99)
Potassium: 3.3 mEq/L — ABNORMAL LOW (ref 3.5–5.1)
Sodium: 138 mEq/L (ref 135–145)
Total Bilirubin: 0.8 mg/dL (ref 0.3–1.2)

## 2011-02-23 LAB — APTT: aPTT: >200 seconds (ref 24–37)

## 2011-02-23 LAB — CROSSMATCH
Antibody Screen: NEGATIVE
Antibody Screen: NEGATIVE

## 2011-02-23 LAB — BASIC METABOLIC PANEL
BUN: 9 mg/dL (ref 6–23)
CO2: 24 mEq/L (ref 19–32)
CO2: 28 mEq/L (ref 19–32)
Calcium: 6.9 mg/dL — ABNORMAL LOW (ref 8.4–10.5)
Calcium: 7.3 mg/dL — ABNORMAL LOW (ref 8.4–10.5)
Chloride: 101 mEq/L (ref 96–112)
Chloride: 104 mEq/L (ref 96–112)
Chloride: 107 mEq/L (ref 96–112)
Creatinine, Ser: 0.83 mg/dL (ref 0.4–1.2)
Creatinine, Ser: 0.86 mg/dL (ref 0.4–1.2)
GFR calc Af Amer: >60 mL/min (ref >60)
GFR calc Af Amer: >60 mL/min (ref >60)
GFR calc non Af Amer: >60 mL/min (ref >60)
GFR calc non Af Amer: >60 mL/min (ref >60)
Glucose, Bld: 160 mg/dL — ABNORMAL HIGH (ref 70–99)
Glucose, Bld: 181 mg/dL — ABNORMAL HIGH (ref 70–99)
Potassium: 3.2 mEq/L — ABNORMAL LOW (ref 3.5–5.1)
Potassium: 3.2 mEq/L — ABNORMAL LOW (ref 3.5–5.1)
Sodium: 136 mEq/L (ref 135–145)
Sodium: 137 mEq/L (ref 135–145)
Sodium: 140 mEq/L (ref 135–145)

## 2011-02-23 LAB — URINALYSIS, ROUTINE W REFLEX MICROSCOPIC
Bilirubin Urine: NEGATIVE
Hgb urine dipstick: NEGATIVE
Ketones, ur: NEGATIVE mg/dL
Nitrite: NEGATIVE
Urobilinogen, UA: 0.2 mg/dL (ref 0.0–1.0)

## 2011-02-23 LAB — MAGNESIUM: Magnesium: 1.4 mg/dL — ABNORMAL LOW (ref 1.5–2.5)

## 2011-02-23 LAB — PROTIME-INR
INR: 3 — ABNORMAL HIGH (ref 0.00–1.49)
Prothrombin Time: 33 seconds — ABNORMAL HIGH (ref 11.6–15.2)
Prothrombin Time: >90 seconds — ABNORMAL HIGH (ref 11.6–15.2)

## 2011-02-23 LAB — PREPARE RBC (CROSSMATCH)

## 2011-02-23 LAB — LIPID PANEL
LDL Cholesterol: 85 mg/dL (ref 0–99)
Total CHOL/HDL Ratio: 4.5 RATIO
Triglycerides: 120 mg/dL (ref <150)
VLDL: 24 mg/dL (ref 0–40)

## 2011-02-23 LAB — HEPARIN ANTIBODY SCREEN: Heparin Antibody Screen: NEGATIVE

## 2011-02-23 LAB — HEPARIN LEVEL (UNFRACTIONATED)
Heparin Unfractionated: 0.27 IU/mL — ABNORMAL LOW (ref 0.30–0.70)
Heparin Unfractionated: 0.57 IU/mL (ref 0.30–0.70)

## 2011-03-01 ENCOUNTER — Other Ambulatory Visit: Payer: Self-pay | Admitting: *Deleted

## 2011-03-01 MED ORDER — GLYBURIDE 5 MG PO TABS: 5.0000 mg | ORAL_TABLET | Freq: Two times a day (BID) | ORAL | Status: DC

## 2011-03-09 ENCOUNTER — Other Ambulatory Visit: Payer: Self-pay | Admitting: *Deleted

## 2011-03-09 ENCOUNTER — Ambulatory Visit (INDEPENDENT_AMBULATORY_CARE_PROVIDER_SITE_OTHER): Payer: Medicare PPO | Admitting: *Deleted

## 2011-03-09 DIAGNOSIS — Z9889 Other specified postprocedural states: Secondary | ICD-10-CM

## 2011-03-09 DIAGNOSIS — I4891 Unspecified atrial fibrillation: Secondary | ICD-10-CM

## 2011-03-09 DIAGNOSIS — I059 Rheumatic mitral valve disease, unspecified: Secondary | ICD-10-CM

## 2011-03-09 MED ORDER — GLYBURIDE 5 MG PO TABS: 5.0000 mg | ORAL_TABLET | Freq: Two times a day (BID) | ORAL | Status: DC

## 2011-03-09 MED ORDER — METHOCARBAMOL 500 MG PO TABS: 500.0000 mg | ORAL_TABLET | Freq: Two times a day (BID) | ORAL | Status: DC

## 2011-03-09 MED ORDER — ALENDRONATE SODIUM 35 MG PO TABS: 35.0000 mg | ORAL_TABLET | ORAL | Status: DC

## 2011-03-09 NOTE — Telephone Encounter (Signed)
I spoke with pt and she confirms that she is switching due to cost.

## 2011-03-09 NOTE — Telephone Encounter (Signed)
The request came from [facility], a couple of the above were filled locally. Please advise.

## 2011-03-09 NOTE — Telephone Encounter (Signed)
Ok to refill- she may be switching (but call first to verify)

## 2011-03-10 ENCOUNTER — Other Ambulatory Visit: Payer: Self-pay | Admitting: *Deleted

## 2011-03-10 MED ORDER — SPIRONOLACTONE 25 MG PO TABS: 25.0000 mg | ORAL_TABLET | Freq: Every day | ORAL | Status: DC

## 2011-03-10 NOTE — Telephone Encounter (Signed)
Refill also faxed to [facility] [phone] [name]

## 2011-03-23 ENCOUNTER — Ambulatory Visit (INDEPENDENT_AMBULATORY_CARE_PROVIDER_SITE_OTHER): Payer: Medicare PPO | Admitting: *Deleted

## 2011-03-23 DIAGNOSIS — Z9889 Other specified postprocedural states: Secondary | ICD-10-CM

## 2011-03-23 DIAGNOSIS — I4891 Unspecified atrial fibrillation: Secondary | ICD-10-CM

## 2011-03-23 DIAGNOSIS — I059 Rheumatic mitral valve disease, unspecified: Secondary | ICD-10-CM

## 2011-03-30 NOTE — Assessment & Plan Note (Signed)
[facility]                            CARDIOLOGY OFFICE NOTE   Kelsey Cochran                         MRN:          [id number]  DATE:08/03/2007                            DOB:          1930-10-26    Kelsey Cochran returns today for followup.   She is status post mitral valve replacement with congestive heart  failure.   We have been changing her medications around quite a bit.  Her Actos and  Avandia were stopped.  She was switched over to glyburide low dose.  She  is being followed by her primary care doctor for her sugars.  Since I  last saw her, she had arthroscopic knee surgery by Dr. Simonne Come on the  right knee.  She had no complications from holding her Coumadin.  She  was bridged with Lovenox.   She had a bout of what may have been diastolic heart failure a few  months ago.  We increased her diuretics to 80 of Lasix b.i.d.  I was not  quite sure why she had this.   There was no evidence of hemolysis on her blood work.  Her BNP was  mildly elevated at 286.  Her last echocardiogram performed, I believe,  in February showed the valve was functioning well with a mean gradient  of around 5.   Since going up on her Lasix, she has felt well.  She is recovering from  her knee.  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   REVIEW OF SYSTEMS:  Otherwise, negative.  Specifically, she has not had  any bleeding diaphysis, chest pain, shortness of breath, PND, orthopnea,  lower extremity edema.   CURRENT MEDICATIONS:  Include:  1. Aspirin a day.  2. Iron.  3. Colace.  4. Folic acid.  5. Lipitor 10 a day.  6. Coumadin as directed.  7. Aranesp.  8. Lasix 80 b.i.d.  9. Imdur 30 a day.  10.Micro-K.  11.Coreg 25 b.i.d.  12.Glyburide 5 mg a day.   EXAMINATION:  Remarkable for being in sinus rhythm at a rate of 70.  Blood pressure is 120/80.  Respiratory  rate is 12.  She is afebrile.  Weight is stable at 185, actually down 4 pounds.  HEENT:  Normal.  Affect is appropriate.  NECK:  Supple.  There is no lymphadenopathy.  No thyromegaly.  No JVP  elevation.  No bruits.  LUNGS:  Clear with good diaphragmatic motion.  No wheezing.  There is an S1 click with an S2.  There is no diastolic rumble or MR  murmur.  PMI is normal.  ABDOMEN:  Benign.  Bowel sounds are positive.  No tenderness.  No AAA.  No hepatosplenomegaly.  No hepatojugular reflux.  Femorals are +3 bilaterally.  PTs are +2.  There is no lower extremity  edema.  She is status post arthroscopic surgery on the right knee with  some mild swelling.  NEUROLOGIC:  Nonfocal.  There  is no muscular weakness.  SKIN:  Warm and dry.   Her baseline EKG shows sinus rhythm with low voltage and nonspecific ST-  T wave changes.   Lab work reviewed from July 05, 2007 showed a potassium of 3.9, BUN of  11, creatinine of 0.9.   IMPRESSION:  1. Mitral valve replacement, functioning normally.  Auscultation      normal.  No evidence of malfunction.  Continue Coumadin      anticoagulation.  We will continue beta blocker therapy to maximize      diastolic filling.  2. Recent congestive heart failure.  Question diastolic in nature.      Continue Lasix 80 b.i.d.  Follow up BMET and BNP.  Continue low-      salt diet.  3. Anticoagulation.  Follow up in Coumadin Clinic.  Not having any      difficulties.  Successful bridge with Lovenox after recent surgery.  4. Right knee pain.  Continue rehab.  Follow up with Dr. Simonne Come.      No evidence of hemarthrosis.  5. Hyperlipidemia.  Continue Lipitor 10 mg a day.  Follow up liver and      lipid profile in 6 months.  6. Diabetes.  Successfully off Actos.  In regards to her fluid status,      continue glyburide.  Follow up primary care M.D. and hemoglobin A1c      quarterly.   So long as Kelsey Cochran's BMET and BNP are within a good range, I will see her  back  in 6 months.     Kelsey Cochran. Eden Emms, MD, [facility]  Electronically Signed    PCN/MedQ  DD: 08/03/2007  DT: 08/03/2007  Job #: [id number]

## 2011-03-30 NOTE — Assessment & Plan Note (Signed)
[facility]                            CARDIOLOGY OFFICE NOTE   DANALI, MARINOS                         MRN:          [id number]  DATE:10/07/2008                            DOB:          01-03-1930    Kelsey Cochran returns today for followup.  She has had previous PAF with mitral  valve replacement.  I believe this was done in 2005 by Dr. Durwin Glaze at  [facility].  She has been maintaining sinus rhythm.  She sees a Coumadin  Clinic for her INRs.  Coronary risk factors include hypertension,  hypercholesterolemia, and diabetes.   She tells me she will be seeing a new primary care doctor.  Dr. Penni Bombard  has gone to a private pay-type system, and she will be seeing Dr.  Beverely Low.  She needed refills on her Lipitor and glyburide.   She has, otherwise, been doing well.  She has not had any shortness of  breath, PND, or orthopnea.  There is a question of previous hemolysis  from the valve, but her hemoglobins have been fine, and she follows Dr.  Myna Hidalgo for this.  She has not needed an Aranesp shot for 7 or 8 months.   REVIEW OF SYSTEMS:  Otherwise, negative.   MEDICATIONS:  1. Baby aspirin.  2. Iron.  3. Colace 100 a day.  4. Folic acid 800 a day.  5. Lipitor 10 a day.  6. Coumadin as directed.  7. Aranesp injections p.r.n.  8. Lasix 80 b.i.d.  9. Imdur 30 a day.  10.Micro-K four 10 mEq capsules b.i.d.  11.Coreg 25 b.i.d.  12.Glyburide 5 a day.   PHYSICAL EXAMINATION:  GENERAL:  An elderly white female in no distress.  VITAL SIGNS:  Weight is 178, blood pressure 140/80, pulse 75 and  regular, respiratory rate 14, and afebrile.  HEENT:  Unremarkable.  NECK:  There is a faint right carotid bruit.  No lymphadenopathy,  thyromegaly, or JVP elevation.  LUNGS:  Clear.  Good diaphragmatic motion.  No wheezing.  CARDIAC:  There is an S1 click with an S2.  No diastolic rumble.  No MR.  PMI normal.  ABDOMEN:  Benign.  Bowel sounds positive.  No AAA.  No tenderness.   No  bruit.  No hepatosplenomegaly or hepatojugular reflux.  EXTREMITIES:  Distal pulses are intact.  No edema.  NEUROLOGIC:  Nonfocal.  SKIN:  Warm and dry.  MUSCULOSKELETAL:  No muscular weakness.   IMPRESSION:  1. History of mitral valve replacement with Cox-maze.  Valve sounds      normal.  Last echo in 2007 showed no significant problems.  2. Coumadin therapy.  Followup in the clinic has been therapeutic      without bleeding.  3. History of paroxysmal atrial fibrillation with Cox-maze procedure      maintaining sinus rhythm.  Continue beta-blocker.  4. History of diastolic dysfunction, currently euvolemic.  Continue      current dose of Lasix.  5. Diabetes.  Needs a refill for glyburide since she is between      primary care doctors.  We will  call this in.  6. Hypercholesterolemia.  Continue statin.  Lipid and liver profile in      6 months.  7. Previous history of 60%-79% right carotid artery stenosis by duplex      in 2005.  Needs a followup carotid duplex.   Currently continue baby aspirin.  No signs of TIA.     [name]. Eden Emms, MD, [facility]  Electronically Signed    PCN/MedQ  DD: 10/07/2008  DT: 10/08/2008  Job #: [id number]

## 2011-03-30 NOTE — Op Note (Signed)
NAMEBITA, CARTWRIGHT                  ACCOUNT NO.:  [id number]   MEDICAL RECORD NO.:  [id number]          PATIENT TYPE:  INP   LOCATION:  4739                         FACILITY:  [facility]   PHYSICIAN:  Madlyn Frankel. Charlann Boxer, M.D.  DATE OF BIRTH:  17-Nov-1929   DATE OF PROCEDURE:  04/11/2009  DATE OF DISCHARGE:                               OPERATIVE REPORT   PREOPERATIVE DIAGNOSIS:  Left intertrochanteric femur fracture.   POSTOPERATIVE DIAGNOSIS:  Left intertrochanteric femur fracture.   PROCEDURE:  Open reduction and internal fixation of left hip fracture  utilizing DePuy trochanteric nail 11 x 200 mm and 105-mm lag screw  distal interlock.   SURGEON:  Madlyn Frankel. Charlann Boxer, M.D.   ASSISTANT:  None.   ANESTHESIA:  General.   BLOOD LOSS:  150 mL.   SPECIMENS:  None.   COMPLICATIONS:  None.   DRAINS:  Hemovac drain due to postoperative anticoagulation.   INDICATIONS OF PROCEDURE:  Ms. 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  She was brought to [facility] where radiographs revealed a left  intertrochanteric femur fracture.  She was admitted to the medical  service.  She was cleared, evaluated, seen by Cardiology with a history  of aortic valve replacement.   Risks and benefits of the procedure were discussed at length.  Consent  was obtained for the benefit of fracture healing, stabilization, and  mobility.  Risks of malunion, nonunion, need for revision surgery,  failure of implant, consent was obtained.   PROCEDURE IN DETAIL:  The patient was brought to the operative theater.  Once adequate anesthesia, preoperative antibiotics, Ancef administered,  the patient was positioned supine on the fracture table.  The unaffected  right lower extremity was flexed and abducted  away on to the abduction  arm with bony prominence padded.  The left foot was placed in the  traction shoe.  Under fluoroscopic imaging, the fracture reduced into an  anatomic position.   I then did a time-out to identify the patient, plan of procedure, and  extremity.  The left hip was then prepped and draped in sterile fashion.  Landmarks were again identified and looked fluoroscopically.  Guidewire  was then inserted into the tip of the trochanter.  It was then passed  into the anterior aspect of the hip on the lateral plane, but into the  center portion of the femoral shaft.  A starting drill was inserted to  drill the proximal femur and then began passing the 11-mm nail.  Guided  about two-thirds of the way in and it felt tight, checked distally and  there was a canal mismatch due to very thick cortical bone.  I removed  the nail at this point and reamed the monoblock reamers from DePuy,  reaming up to 12.5 reamer.  I then was able to pass the nail by  hand  without difficulty.   With the nail in its nail was in correct position, I passed the  guidewire into the center of the head in the AP and lateral planes,  measured the depth, drilled, tapped for a 105-mm lag screw.  The lag  screw was then inserted.  Compression applied to medialize the shaft of  the fracture.   A distal interlock was then drilled and screwed in as a 36-mm screw.  The jig was removed.  Final radiographs obtained, AP and lateral planes,  confirmed a reduction nail position.  The wounds were then irrigated.  The proximal wound was closed with 0 Vicryl in the gluteal fascia and  then medium Hemovac drain was placed deep.  I then reapproximated the  subcu layers bilaterally proximally and distally with 2-0 Vicryl and  staples on the skin.  Skin was cleaned, dried, and dressed sterilely  with Mepilex dressing.  She was then brought to the recovery room,  extubated in stable condition, tolerated the procedure  well.      Madlyn Frankel Charlann Boxer, M.D.  Electronically Signed     MDO/MEDQ  D:  04/11/2009  T:  04/12/2009  Job:  [id number]

## 2011-03-30 NOTE — H&P (Signed)
[facility], [facility]                  ACCOUNT NO.:  [id number]   MEDICAL RECORD NO.:  [id number]          PATIENT TYPE:  IPS   LOCATION:  4036                         FACILITY:  [facility]   PHYSICIAN:  Ranelle Oyster, M.D.DATE OF BIRTH:  1930/07/21   DATE OF ADMISSION:  04/16/2009  DATE OF DISCHARGE:                              HISTORY & PHYSICAL   CHIEF COMPLAINT:  Left hip pain.   PRIMARY CARE PHYSICIAN:  [facility].   CARDIOLOGIST:  [name]. Eden Emms, MD, [facility]   HISTORY OF PRESENT ILLNESS:  This is a 77 year old white female with  chronic atrial fibrillation admitted on Apr 10, 2009, after a fall.  X-  rays revealed left intertrochanteric hip fracture.  She is cleared by  Cardiology for surgery.  INR on admission was 3.0 and this was reversed.  She underwent ORIF on Apr 11, 2009, by Dr. Charlann Boxer.  She is 50%  weightbearing, left lower extremity.  Coumadin was resumed.  She has had  postoperative anemia transfused from a hemoglobin 7.3 to 8.7.  She had  bouts of confusion, likely related to Dilaudid usage.  Medications were  reduced.  The patient had IV heparin placed until INR greater than 2.5.  The patient has developed decreased platelets and IV heparin was changed  with IV Refludan with HIT panel negative.  Left hip notable for moderate  redness and drainage.  Keflex added on May 16, 2009, for wound coverage.  The patient had persistent hemoglobin, decreased to 8.7, was transfused  unit of packed red blood cells today as it clearly will benefit her from  therapy tolerance standpoint.  The patient was seen in Rehab  consultation yesterday and was requiring max to total assistance for  basic mobility and it was felt that she could benefit from an inpatient  rehab admission today.   REVIEW OF SYSTEMS:  Notable for palpitations, left hip pain.  Foley  catheter still is in place.  Appetite is fair.  She complains of muscle  spasms in the left hip as well.   PAST MEDICAL  HISTORY:  Positive for:  1. CAF.  2. Mitral valve replacement in 2005.  3. Non-insulin-requiring diabetes.  4. Hyperlipidemia.  5. Peripheral vascular disease.  6. Right internal carotid artery stenosis of 60-80%.  7. CHF.  8. TIA.  9. Denies alcohol or tobacco use.   FAMILY HISTORY:  Positive for CAD.   SOCIAL HISTORY:  The patient lives with her grandson who works.  Daughter will assist as needed upon discharge.   ALLERGIES:  1. DEMEROL.  2. SEPTRA.  3. SULFA.   HOME MEDICATIONS:  Lasix, Coreg, Imdur, Micro-K, aspirin, glyburide,  folic acid, iron sulfate, Colace, Lipitor, and Coumadin.   LABORATORIES:  Hemoglobin 8.7, white count 6.2, and platelets 74,000.  Sodium 142, potassium 3.4, BUN 9, and creatinine 0.7.   PHYSICAL EXAMINATION:  VITAL SIGNS:  Blood pressure is 117/61, pulse is  94, respiratory rate 18, and temperature 98.6.  GENERAL:  The patient is pleasant, alert and oriented x3.  Pupils are  equally, round and reactive to light.  EAR, NOSE, AND THROAT:  Essentially is unremarkable with fair dentition  and pink moist mucosa.  NECK:  Supple without JVD or lymphadenopathy.  HEART:  Regular rate and rhythm without murmur, rubs, or gallops.  She  did have a systolic click.  CHEST:  Clear to auscultation bilaterally without wheezes, rales, or  rhonchi.  ABDOMEN:  Soft and nontender.  Bowel sounds are positive.  SKIN:  Notable for left hip wound with serosanguineous drainage.  Wound  was well-approximated.  She has small blister right at the wound line of  the proximal incision.  She had some bruising surrounding the area as  well.  Left leg was generally swollen and the hip in tender.  NEUROLOGIC:  Cranial nerves II through XII are grossly intact.  Reflexes  are 1+ and sensation essentially is within normal limits.  Judgment,  orientation, memory and mood were all within functional limits as well.  Strength was 5/5 in the upper extremities.  In the left lower  extremity,  she is 1/5 proximally and 4/5 distally.  Right lower extremity, she is 3-  4/5 proximal to 4+/5 distally.   POST ADMISSION PHYSICIAN EVALUATION:  1. Functional deficits, secondary to left intertrochanteric hip      fracture, status post ORIF on Apr 11, 2009.  2. The patient is admitted to receive collaborative interdisciplinary      care including the physiatrist rehab nursing staff and therapy      team.  3. The patient's level of medical complexity and substantial therapy      needs in context of that medical necessity cannot be provided at      lesser intensity of care.  4. The patient has experienced substantial functional loss from her      baseline.  Upon functional assessment, at the time of preadmission      screening.  Yesterday, the patient was max-to-total assist for      basic mobility and self-care.  Premorbidly, the patient was      independent in driving.  Judging by the patient's diagnosis,      physical exam, and functional history, she has a potential for      functional progress, which will result in measurable gains while in      inpatient rehab.  These gains will be of substantial and practical      use upon discharge to home in facilitating mobility and self-care.      Interim changes and medical status since preadmission screening are      detailed above.  5. Physiatrist will provide 24-hour management of medical needs as      well as oversight of the therapy plan/treatment and provide      guidance as appropriate regarding interaction of the 2.  Medical      problem list and plan are listed below.  6. 24-hour rehab nursing will assist in management of the patient's      skin care needs as well as pain management, safety, nutrition,      bowel and bladder function, and integration of therapy concepts and      techniques.  7. PT will assess and treat for lower extremity strength, range of      motion, observing for weightbearing precautions, and  looking      towards pain management.  The patient likely will benefit from      scheduled pain medication to pretreat her before therapies.  Goals      are  supervision to occasional modified independent.  8. OT will assess and treat for upper extremity use ADLs, adaptive      techniques, and equipment with goals overall at a modified      independent to occasional min assist level.  9. Case management and social worker will assess and treat for      psychosocial issues and discharge planning.  10.Team conferences will be held weekly to assess progress towards      goals and to determine barriers at discharge.  11.The patient has demonstrated sufficient medical stability and      exercise capacity to tolerate at least 3 hours of therapy per day      at least 5 days per week.  12.Estimated length of stay is 10-14 days.   PROGNOSIS:  Good.   MEDICAL PROBLEM LIST AND PLAN:  1. Pain control:  We will schedule Robaxin around-the-clock as she has      done well with this.  Also, try to encourage Tylenol first and      Vicodin for more severe pain, although the patient was encouraged      to use Vicodin if Tylenol was insufficient.  She seemed to have      more problems previously with the Dilaudid more than anything else.  2. Postoperative anemia:  The patient received another unit of packed      red blood cells today.  We will follow up CBCs on a serial basis.      The patient at risk for further bleeding due to the      anticoagulation.  There is substantial bruising and swelling at the      leg, which is likely source.  3. Anticoagulation with IV Refludan to Coumadin.  Most recent INR is      1.4.  Observe for signs and symptoms of bleeding and complications.  4. Thrombocytopenia:  The patient off IV heparin currently and on      Refludan with stabilization.  We will follow platelet serially.  5. Hypertension:  Lasix and Imdur.  Blood pressure is intensive today.      We will follow  for fluctuation with activity and pain.  6. Congestive heart failure:  Watch weights and strict In's and Out's.  7. Diabetes:  Glyburide 5 mg b.i.d. on board.  Cover with sliding      scale insulin.  Sugars have been borderline recently.  We will      adjust medication as appropriate going forward.  The patient on      modified carbohydrate diet as well.  Mitral valve replacement in      2005.  Cardiac follow up p.r.n.  Anticoagulation as above.      Ranelle Oyster, M.D.  Electronically Signed     ZTS/MEDQ  D:  04/16/2009  T:  04/17/2009  Job:  [id number]

## 2011-03-30 NOTE — Op Note (Signed)
Kelsey Cochran, Kelsey Cochran                  ACCOUNT NO.:  [id number]   MEDICAL RECORD NO.:  [id number]          PATIENT TYPE:  OIB   LOCATION:  1524                         [facility]ACILITY:  [facility]   PHYSICIAN:  Marlowe Kays, M.D.  DATE OF BIRTH:  Mar 14, 1930   DATE OF PROCEDURE:  07/06/2007  DATE OF DISCHARGE:                               OPERATIVE REPORT   PREOPERATIVE DIAGNOSIS:  1. Torn medial and lateral menisci.  2. Osteoarthritis right knee.   POSTOPERATIVE DIAGNOSIS:  1. Torn medial and lateral menisci.  2. Osteoarthritis right knee.   OPERATION:  Right knee arthroscopy with partial medial meniscectomy,  shaving of medial lateral femoral condyle and patella.   SURGEON:  Dr. Simonne Come   ASSISTANT:  Nurse.   ANESTHESIA:  General.   INDICATIONS FOR PROCEDURE:  Very painful right knee with MRI  demonstrating the above findings and consequently she is here for  surgery.  She has been cleared medically and we are going to cover her  with antibiotics and Lovenox.   PROCEDURE:  Prophylactic antibiotics, satisfied general anesthesia, Ace  wrap and a knee support to the left lower extremity, pneumatic  tourniquet to right lower extremity with leg Esmarched out non  sterilely.  Thigh stabilizer applied, right leg was prepped with  DuraPrep from stabilizer to ankle and draped in sterile field.  Superior  medial saline inflow first through an anterior lateral portal medial  compartment, joint was evaluated.  She had grade 3/4 chondromalacia of  the medial femoral condyle which I shaved down until smooth.  Posteriorly she had a tear perpendicular to the meniscus in the mid  third back to almost to the synovial lining.  I resected out the torn  flaps and tried to smooth down the meniscus tapering it to either side.  I also shaved down the inner aspect of the rest meniscus somewhat.  Final pictures were taken looking at the medial gutter and suprapatellar  area, she had a good bit of mid  patella wear which I shaved down until  smooth.  Reversing portals, lateral meniscus had a complex tear  throughout the anterior two-thirds.  I was able to resect this back to  stable rim with a 3.5 shaver.  Also did some smoothing down of the  lateral femoral condyle.  The joint was then irrigated until clear and  all fluid possible removed.  Portals were closed with 4-0 nylon.  I  injected 20 mL half percent Marcaine adrenalin, 4 mg of morphine  through the inflow apparatus which was then removed and this portal  closed with 4-0 nylon as well.  Betadine, Adaptic, and dry sterile  dressing were applied.  Tourniquet was released.  She tolerated the  procedure well and was taken recovery in satisfactory condition with no  known complications.           ______________________________  Marlowe Kays, M.D.     JA/MEDQ  D:  07/06/2007  T:  07/07/2007  Job:  [id number]

## 2011-03-30 NOTE — Assessment & Plan Note (Signed)
[facility]                               LIPID CLINIC NOTE   Kelsey, Cochran                         MRN:          [id number]  DATE:06/28/2007                            DOB:          Feb 24, 1930    Kelsey Cochran is anticoagulation clinic for followup associated with her  anticoagulation needs due to her history of mitral valve replacement in  the setting of mitral valve disorder.  Kelsey Cochran is scheduled to have  arthroscopic knee surgery at a yet to be determined date and will  require perioperative anticoagulation therapy.  The patient has taken  Lovenox in the past and is comfortable with injection technique.  She  has several caregivers in her neighborhood who will be able to  facilitate her injection delivery.   Kelsey Cochran weight today in the office is 185 pounds, which approximates  84 kg.   She is not allergic to heparin and has had no reaction issues in the  past.  She has a serum creatinine checked at [facility] on I-STAT in  April 2008 which is 0.9 mg/dL; based on this and her age of 75 years,  she is appropriate for b.i.d. Lovenox injections.  When the date of  procedure is available, we will discontinue Coumadin 5 days prior to the  date of surgery.  She will have her last injection of Lovenox 24 hours  prior to the procedure.  She will take twice-daily Lovenox injections  starting 36 hours after her last dose of Coumadin.  I have given her a  prescription for 80-mg syringes.  The patient will call with date of  procedure.  The patient's plan has been reviewed with the staff of the  anticoagulation clinic and the patient; she agrees to proceed  accordingly.     Shelby Dubin, PharmD, BCPS, CPP  Electronically Signed    MP/MedQ  DD: 06/28/2007  DT: 06/29/2007  Job #: [id number]   cc:   [name]. Eden Emms, MD, [facility]

## 2011-03-30 NOTE — Assessment & Plan Note (Signed)
[facility]                            CARDIOLOGY OFFICE NOTE   Kelsey Cochran, Kelsey Cochran                         MRN:          [id number]  DATE:01/17/2008                            DOB:          1929-12-31    Kelsey Cochran returns today for follow-up.  She has had PAF with a Maze  procedure, mitral valve replacements, and diastolic dysfunction with  fluid overload.  Improved on increased diuretics.  She has chronic  anemia with no evidence of hemolysis on Aranesp.   The patient has been doing fairly well.  She has had a bad URI recently  but seems to have recovered from it.  She needs to get her Coumadin  level checked today.   REVIEW OF SYSTEMS:  Remarkable for no significant palpitations, no chest  pain, improving shortness of breath, improving lower extremity edema.  She is concerned about her daughter.  Apparently her daughter is  supposed to see me, but did not follow up with an appointment.  She has  lupus and PVCs.  I told we would try to expedite this with an echo and  an appointment to see me within the next few weeks.   Her current medications include an aspirin a day, iron, Colace folic  acid, Lipitor 10 a day, Coumadin as directed, Aranesp injection monthly,  Lasix 80 t.i.d. Imdur 30 a day, Micro-K 10 a day, Coreg 25 b.i.d.  glyburide 5 a day.   EXAM:  Remarkable for chronically ill-appearing elderly white female in  no distress.  Affect is appropriate.  Weight is down 174.  Previously she was 185 on August 02, 2004.  She  weighs herself every day at home in her baseline tends to be about 172,  blood pressure is 140/88, pulse 79 and regular, afebrile.  Respiratory  rate 14.  HEENT:  Unremarkable.  Carotids are normal without bruit, no lymphadenopathy, thyromegaly JVP  elevation.  LUNGS:  Clear diaphragmatic motion.  There is mild expiratory wheezing  in the left upper lobe.  There is S1 click with an S2.  There is no diastolic rumble.  PMI  is  normal.  ABDOMEN:  Benign.  Bowel sounds positive.  No AAA.  No tenderness,  hepatosplenomegaly or hepatojugular reflux.  No bruit.  Distal pulses intact.  There are minor spider veins and trace lower  extremity edema.  NEURO:  Nonfocal.  SKIN:  Warm and dry.  No muscular weakness.   EKG shows sinus rhythm, nonspecific ST-T wave changes and occasional  PVC.   IMPRESSION:  1. His mitral valve replacement functioning normally.  Normal function      by echo as well.  Continue beta-blocker to maximize filling time.      Continue Coumadin.  INR to be checked today in clinic.  2. Previous volume overload with no evidence of LV systolic      dysfunction.  Trace lower extremity edema.  Continue Lasix 80      b.i.d., low-salt diet, weighing herself every day.  Appears      euvolemic.  3. Anemia.  Follow up  with primary care M.D.  Continue iron      replacement, folic acid and Aranesp.  4. History of paroxysmal atrial fibrillation.  Maintaining sinus      rhythm on Coumadin for her valve.  Continue beta-blocker at current      dose.  5. Hypercholesterolemia.  Continue Lipitor 10 mg a day, lipid and      liver profile in 6 months.   Overall I think Kelsey Cochran is doing well and I will see her in 6 months.  Hopefully I will get to see her daughter sooner.     [name]. Eden Emms, MD, [facility]  Electronically Signed    PCN/MedQ  DD: 01/17/2008  DT: 01/17/2008  Job #: [id number]

## 2011-03-30 NOTE — Discharge Summary (Signed)
NAMEILLIANNA, Cochran                  ACCOUNT NO.:  [id number]   MEDICAL RECORD NO.:  [id number]          PATIENT TYPE:  INP   LOCATION:  4739                         FACILITY:  [facility]   PHYSICIAN:  Lonia Blood, M.D.       DATE OF BIRTH:  03/15/1930   DATE OF ADMISSION:  04/10/2009  DATE OF DISCHARGE:  04/16/2009                               DISCHARGE SUMMARY   PRIMARY CARE PHYSICIAN:  Neena Rhymes, MD   PRIMARY CARDIOLOGIST:  [name]. Eden Emms, MD, [facility]   DISCHARGE DIAGNOSES:  1. Fall resulting in left intertrochanteric femur fracture status post      open reduction and internal fixation by Dr. Durene Romans.  2. Acute blood loss anemia due to the fracture and surgery.  3. Thrombocytopenia felt to be most likely heparin-induced      thrombocytopenia even though HIT panel was negative.  4. Mitral valve disease status post mitral valve replacement.  5. History of chronic atrial fibrillation status post maze procedure.  6. Diabetes mellitus.  7. Diastolic congestive heart failure.  8. Hypertension.  9. Hyperlipidemia.  10.Hyperlipidemia.  11.Delirium during the hospitalization, resolved by the time of      discharge.   DISCHARGE MEDICATIONS:  1. Aspirin 81 mg daily.  2. Keflex 500 mg 3 times a day.  3. Colace 100 mg twice a day.  4. Iron sulfate 325 mg 3 times a day.  5. Folic acid 1 mg daily.  6. Lasix 80 mg twice a day.  7. Glyburide 5 mg twice a day.  8. Insulin sliding scale.  9. Imdur 30 mg daily.  10.Robaxin 500 mg every 6 hours.  11.Potassium 50 mEq twice a day.  12.Zocor 40 mg daily.  13.Coumadin per pharmacy protocol.  14.Tylenol as needed for pain.  15.Refludan continuous intravenous infusion.  16.Trazodone as needed for sleep.   CONDITION ON DISCHARGE:  [name] is transferred to the acute  inpatient rehabilitation unit.   PROCEDURE DURING THIS ADMISSION:  On Apr 11, 2009, the patient underwent  a left intertrochanteric femur fracture open reduction and  internal  fixation by Dr. Durene Romans.   CONSULTATION DURING THIS ADMISSION:  The patient was seen in  consultation by [facility] as well as Dr. Durene Romans as  well as  Dr. Faith Rogue from Physical Medicine and Rehabilitation  team.   HISTORY AND PHYSICAL:  Refer to written H and P, which was done on Apr 10, 2009, by Dr. Rene Paci.  Briefly, the patient with complex  medical problems fell and had a hip fracture.  She had an INR of 3 upon  admission, so she require stabilization with blood transfusions and  fresh frozen plasma prior to her procedure.  She was then admitted to  the medical team with orthopedic consultation.   HOSPITAL COURSE:  1. [name] was taken to the operating room on Apr 11, 2009, where      she had her open reduction internal fixation.  Postoperatively, the      patient developed significant acute blood loss anemia  requiring a      total of 5 units of packed red blood cells.  Upon the initial      presentation, the patient's platelet count was at 115.  She has      been under the care of a hematologist for years.  We did not have      any reports of the patient suffering from a primary hematological      abnormality.  Postoperatively, the patient was started on a heparin      drip while her Coumadin was trending back to therapeutic levels.      It was noticed that she dropped her platelets to a level of 75 on      Apr 12, 2009.  At that point in time, a heparin-induced      thrombocytopenia panel was sent out and because she further dropped      her platelets to a level of 67 she was switched to Refludan.  As      the patient was taken off of the heparin, the platelet count did      improve some to a level of 74 at the time of discharge.  She      stabilized nicely from this point of view, and at the time of      discharge, the patient's hip wound is without any significant      oozing.  Kelsey Cochran will follow up with her  primary      hematologist, Dr. Arlan Organ in the outpatient setting to      further investigate her anemia and thrombocytopenia.  2. Delirium.  Postoperatively, Kelsey Cochran developed severe delirium.      We felt that the source of her delirium was the medications needed      for pain control.  Namely, we feel the hydromorphone was one of the      main offenders.  After we were able to discontinue the opiates, the      patient's delirium cleared completely and by the time of the      discharge, the patient was alert, oriented, calm, and cooperative.  3. Status post mechanical mitral valve replacement.  The patient was      followed throughout this hospitalization by the [facility]      Group and decisions regarding anticoagulation were made by them.  4. Acute-on-chronic diastolic congestive heart failure.  After      multiple transfusions, Kelsey Cochran became short of breath.  We felt      that this was due to decompensation of her known diastolic      congestive heart failure and the patient responded nicely to      intravenous furosemide.  By the time of the discharge, the      patient's respiratory status was within normal limit.  5. Postoperative rehabilitation.  Kelsey Cochran was seen by Dr. Riley Kill      from Physical Medicine and Rehab and she was felt to be a good      candidate for inpatient rehab.  She was transferred to their unit      on April 16, 2009.      Lonia Blood, M.D.  Electronically Signed     SL/MEDQ  D:  04/17/2009  T:  04/17/2009  Job:  [id number]   cc:   [name]. Eden Emms, MD, [facility]  Neena Rhymes, M.D.  Madlyn Frankel Charlann Boxer, M.D.

## 2011-03-30 NOTE — Discharge Summary (Signed)
NAMEHEATHERLY, Kelsey Cochran                  ACCOUNT NO.:  [id number]   MEDICAL RECORD NO.:  [id number]          PATIENT TYPE:  IPS   LOCATION:  4036                         FACILITY:  [facility]   PHYSICIAN:  Kelsey Cochran, M.D.DATE OF BIRTH:  [birth year]   DATE OF ADMISSION:  04/16/2009  DATE OF DISCHARGE:                               DISCHARGE SUMMARY   DISCHARGE DIAGNOSES:  1. Left intertrochanteric femur fracture with open reduction and      fixation on Apr 11, 2009, pain management, postoperative anemia,      Coumadin.  2. Thrombocytopenia, resolved.  3. Hypertension.  4. Congestive heart failure.  5. Hyperlipidemia.  6. Non-insulin-dependent diabetes mellitus.  7. History of mitral valve replacement in 2005.   This is a 77 year old female with history of chronic atrial fibrillation  and chronic Coumadin therapy who was admitted on Apr 10, 2009, after a  fall without loss of consciousness.  X-ray showed a left  intertrochanteric femur fracture.  Cardiology clearance per Dr. Eden Cochran.  INR on admission 3.0 and reversed.  Underwent open reduction and  external fixation on Apr 11, 2009, per Dr. Charlann Cochran.  A 50% partial  weightbearing, chronic Coumadin resumed.  Postoperative anemia 7.3,  transfuse with hemoglobin improved to 8.7.  Bouts of confusion, improved  with decreased narcotics.  Intravenous heparin had been add until INR  greater than 2.5, develop decreased platelets to 70,000, intravenous  heparin changed and intravenous Refludan with HIT panel negative.  Left  hip with modest redness and drainage and Keflex had been added on April 16, 2009, for wound coverage.  She was admitted for comprehensive rehab  program.   PAST MEDICAL HISTORY:  See discharge diagnoses.  No alcohol or tobacco.   ALLERGIES:  1. DEMEROL.  2. SEPTRA.  3. SULFA.   SOCIAL HISTORY:  Lives with grandson, grandson works.  Daughter to  assist as needed.   FUNCTIONAL HISTORY PRIOR TO ADMISSION:  Independent  driving.   FUNCTIONAL STATUS UPON ADMISSION TO REHAB SERVICES:  +2 total assist, 2  feet with rolling walker, +2 total assist transfers, +2 total assist for  bed mobility and total assist for activities of daily living.   MEDICATIONS PRIOR TO ADMISSION:  1. Lasix 80 mg twice daily.  2. Coreg 25 mg twice daily.  3. Imdur 30 mg daily.  4. Micro-K 10 mEq 5 tablets twice daily.  5. Aspirin 81 mg daily.  6. Glyburide 5 mg twice daily.  7. Folic acid daily.  8. Iron supplement twice daily.  9. Cochran twice daily.  10.Lipitor 20 mg daily.  11.Coumadin as directed.   PHYSICAL EXAMINATION:  VITAL SIGNS:  Blood pressure 117/61, pulse 94,  temperature 96, and respirations 18.  GENERAL:  This was an alert female, in no acute distress, oriented x3.  Hip incision with some modest serosanguineous drainage, erythema, no  odor.  Staples remained intact.  Calves remained cool without swelling,  erythema, or nontender.  Sensation intact to light touch.  LUNGS:  Clear  to auscultation.  CARDIAC:  Regular rate and rhythm.  ABDOMEN: Soft  and  nontender.  Good bowel sounds.   REHABILITATION HOSPITAL COURSE:  The patient was admitted to inpatient  rehab services with therapies initiated on a 3-hour daily basis  consisting of physical therapy, occupational therapy, and rehabilitation  nursing.  The following issues were addressed during the patient's  rehabilitation stay.  Pertaining [name] left intertrochanteric femur  fracture, she had undergone open reduction and external fixation on Apr 11, 2009, per Dr. Charlann Cochran.  She is 50% partial weightbearing.  Pain control  ongoing with the use of Vicodin as Robaxin with good results.  Postoperative anemia was stable at 9.7.  She remained on iron  supplement.  Her platelet count with rebounding nicely at 94,000.  Her  intravenous heparin had been discontinued due to decrease in platelet  and remained on Refludan until INR greater than 2.5.  Blood pressures   controlled with  Lasix and Imdur.  She exhibit no signs of fluid  overload.  She would remain on Zocor for hyperlipidemia.  Glyburide 5 mg  twice daily for non-insulin-dependent diabetes mellitus.  Blood sugars  were well-controlled.  She did have a history of mitral valve placement  in 2005, followed by cardiology services.  She had no chest pain or  shortness of breath.   Functionally the patient with received weekly collaborative  interdisciplinary team conferences to discuss estimated length of stay,  family teaching, and any barriers to discharge.  She was minimal assist  for activities of daily living, minimal assist bed mobility,  supervision, otherwise no unsafe behavior, continent of bowel and  bladder, strength endurance greatly improved and ultimate plan was to be  discharged to home.  The patient would remain on chronic Coumadin  therapy with INR of 2.5-3.5 followed by Kelsey Cochran at the [facility] with latest INR on June 2010, of 2.9.   DISCHARGE MEDICATIONS AT TIME OF DICTATION:  1. Coumadin 5 mg on Sunday and Thursday, 3.375 mg other days.  This      would be adjusted accordingly.  2. Lasix 80 mg twice daily.  3. Imdur 30 mg daily.  4. Aspirin 81 mg daily.  5. Glyburide 5 mg twice daily.  6. Folic acid 1 mg daily.  7. Cochran 100 mg twice daily.  8. Zocor 40 mg daily.  9. Ferrous sulfate 325 mg 3 times daily.  10.Robaxin 500 mg every 6 hours as needed for spasms.  11.Potassium chloride 50 mEq twice daily.  12.Vicodin 5 - 325 one or two tablets every 4 hours as needed for      pain, dispense with 90 tablets.   DIET:  Diabetic diet.   SPECIAL INSTRUCTIONS:  Partial weightbearing to left lower extremity  follow up Dr. Shelby Cochran at [facility] for chronic Coumadin  therapy for atrial fibrillation with goal INR to be 2.5-3.5.  The  patient was deciding against home health therapies versus outpatient  therapies and MD to be notified.   FOLLOWUP:   1. She would follow up with Dr. Charlann Cochran orthopedic services call for      appointment.  2. Follow up [facility], call for appointment and      Dr. Eden Cochran on cardiology services.      Mariam Dollar, P.A.      Kelsey Cochran, M.D.  Electronically Signed    DA/MEDQ  D:  04/24/2009  T:  04/24/2009  Job:  [id number]   cc:   Kelsey Cochran, M.D.  [name].  Kelsey Emms, MD, [facility]  Kelsey Cochran, PharmD, BCPS, CPP

## 2011-03-30 NOTE — Consult Note (Signed)
VASCULAR SURGERY CONSULTATION   Cochran, Kelsey B  DOB:  12/04/1929                                       11/19/2008  ZOXWR#:[id number]   I saw Kelsey Cochran in the office today in consultation concerning a  moderate right carotid stenosis.  She was referred by Dr. Eden Emms.  This  is a pleasant 77 year old right-handed woman who has known carotid  disease, and has been followed by Dr. Eden Emms.  On her most recent follow-  up duplex scan, it was noted that the stenosis on the right had  progressed and was in the 60-79% range.  There was a large plaque noted  at the right ICA ostium, and subsequent CT angio was obtained.  This  showed only a 35% diameter stenosis in the right internal carotid  artery.  She was sent for vascular consultation.  Of note, she did have  a transient ischemic attack in 2005 which was associated with expressive  aphasia and right-handed weakness.  However, this was attributed to  atrial fibrillation.  She had no significant left carotid stenosis at  that time.  She has had no other history of stroke, TIAs or amaurosis  fugax.   PAST MEDICAL HISTORY:  Significant for:   1. Mitral valve replacement done at [facility] in 2005.  She also had      tricuspid valve repair and a maze procedure for atrial      fibrillation.  2. She also has adult onset diabetes.  3. Hypertension.  4. Hypercholesterolemia.  5. History of congestive heart failure.  6. She denies any history of previous myocardial infarction or history      of COPD.   FAMILY HISTORY:  There is no history of premature cardiovascular  disease.   SOCIAL HISTORY:  She is widowed.  She has 6 children.  She is a retired  Char[employer] fundraiser.  She does not use tobacco.   Review of systems and medications are documented on the medical history  form in her chart.   PHYSICAL EXAMINATION:  This is a pleasant, 77 year old woman who appears  her stated age.  Her blood pressure is 161/94, heart rate is 76.  Neck:  Supple.  There is no cervical lymphadenopathy.  I do not detect any  carotid bruits.  HEENT:  Unremarkable.  Cardiac:  She has crisp heart  sounds with no significant murmur appreciated.  Lungs:  Clear  bilaterally to auscultation.  Abdomen:  Soft and nontender.  I cannot  palpate an aneurysm.  She has normal pitch bowel sounds.  She has  palpable femoral and pedal pulses bilaterally.  She has no significant  lower extremity swelling.  She has no ischemic ulcers.  Neurologic:  Nonfocal.   Carotid duplex scan done at the [facility] office shows a 60-79% right  carotid stenosis with no significant stenosis on the left.  Both  vertebral arteries are patent with normally directed flow.   I have explained that I trust the duplex more than the CTA in terms of  determining the severity of the stenosis.  Regardless as she is  asymptomatic, I would not recommend carotid endarterectomy unless she  developed new right hemispheric symptoms or the right carotid stenosis  progressed to greater than 80%.  She will continue to have her follow-up  duplex scans done at the [place] office, and I  will be happy to see her  back at any time if any new symptoms develop or the stenosis progresses  to greater than 80%.   Kelsey Cochran. Edilia Bo, M.D.  Electronically Signed  CSD/MEDQ  D:  11/19/2008  T:  11/20/2008  Job:  [id number]   cc:   [name]. Eden Emms, MD, [facility]

## 2011-03-30 NOTE — Assessment & Plan Note (Signed)
[facility]                            CARDIOLOGY OFFICE NOTE   KIOWA, PEIFER                         MRN:          [id number]  DATE:05/03/2007                            DOB:          22-Feb-1930    Letzy returns for followup.  She has had some recent congestive heart  failure.  It is not clear whether it was diastolic in nature.  She has a  prosthetic mitral valve.  It appeared to be functioning normally by echo  with no periprosthetic leak.  We had increased her diuretic and beta  blocker to increase her diastolic filling times, and to lower her  volume, and she is doing much better.  Her lab work showed no evidence  of hemolysis with a normal haptoglobin and a reticulocyte count was not  elevated.  She has chronic anemia from some mild renal insufficiency.  In talking to Floris, she feels better.  She has not had any significant  PND or orthopnea.  There have been no palpitations.  She has not had a  cough, fever, or sputum production.  She does not feel like her legs  have been swelling at all. She can sleep through the night.   REVIEW OF SYSTEMS:  Remarkable for URI at the end of April.  She  apparently was given Augmentin, and had some nausea and vomiting with  it.  She was then given Zithromax with resolution.  I cautioned Anhelica  about making sure she monitors her pro time whenever any antibiotic is  given.  Her review of systems is otherwise negative.   CURRENT MEDICATIONS:  1. Coreg 25 mg b.i.d.  2. Imdur 30 mg daily, 680 b.i.d.  3. Aranesp injection for anemia once a month.  4. Coumadin as directed by our clinic.  5. Lipitor 10 a day.  6. Folic acid 800 a day.  7. Colace 100 b.i.d.  8. Iron 325 b.i.d.  9. Glyburide 5 mg a day.  10.Baby aspirin a day.   EXAMINATION:  The weight is 189, which is stable.  Blood pressure is  130/72.  Pulse is 70 and regular.  She is afebrile.  Respiratory rate is  16.  HEENT:  Normal.  Carotids  normal with bruit.  There is no JVP elevation.  No thyromegaly.  No lymphadenopathy.  LUNGS:  Clear without wheezing.  Normal diaphragmatic motion.  There is an S1 click with an S2.  There is no diastolic rumble or MR.  PMI is normal.  ABDOMEN:  Benign.  Bowel sounds positive.  There is no tenderness.  No  hepatosplenomegaly.  No hepatojugular reflux.  No AAA.  Femorals are +3 bilaterally without bruit.  PTs are +2 bilaterally.  There is no lower extremity edema.  NEUROLOGIC:  Nonfocal.  There is no muscular weakness.   IMPRESSION:  1. Stable mitral valve replacement functioning normally by echo.      Continue anticoagulation.  2. Apparent high-filling pressures with congestive heart failure,      improved on higher dose Lasix.  Continue at b.i.d.  Follow up BMET  and BNP in about 3 months.  3. Anemia.  No evidence of hemolysis.  Continue Aranesp once a month      with iron replacement.  4. Lower extremity edema, resolved with current dose of Lasix.      Continue this with potassium replacement.  5. Diabetes.  No on Actos or Avandia.  Continue glyburide at low dose.      Follow up with primary care M.D. for quarterly hemoglobin A1c.   Overall, I think Kelsey Cochran is improved, and there does not appear to be any  need for followup TE.     [name]. Eden Emms, MD, [facility]  Electronically Signed    PCN/MedQ  DD: 05/03/2007  DT: 05/03/2007  Job #: [id number]

## 2011-03-30 NOTE — Consult Note (Signed)
Kelsey Cochran, Kelsey Cochran                  ACCOUNT NO.:  [id number]   MEDICAL RECORD NO.:  [id number]          PATIENT TYPE:  INP   LOCATION:  4729                         FACILITY:  [facility]   PHYSICIAN:  Erasmo Leventhal, M.D.DATE OF BIRTH:  02-13-1930   DATE OF CONSULTATION:  04/10/2009  DATE OF DISCHARGE:                                 CONSULTATION   TIME OF CONSULTATION:  7 p.m.   Kelsey Cochran is a 77 year old Caucasian female who fell at home today and  sustained a left hip fracture.  She was brought to the [facility]  Emergency Room and seen and evaluated by Dr. Adriana Simas.  I was requested to  see her as a consult secondary to fractured hip, currently being  admitted by Dr. Rene Paci.  Now complains only of left hip pain.  There is no history noted of loss of consciousness or head or neck  injury.   ALLERGIES:  DEMEROL, SEPTRA, and SULFA.   PAST MEDICAL HISTORY:  1. Atrial fib.  2. Mechanical mitral valve.  3. Diabetes mellitus.  4. History of congestive heart failure.  5. Hypertension.  6. Hypercholesterolemia.   MEDICATIONS:  She is on chronic Coumadin therapy, aspirin, Coreg, iron  and folic acid, glyburide, Imdur, Lasix, Lipitor, potassium.   SURGICAL HISTORY:  Left shoulder surgery.   PRIMARY CARE DOCTOR:  Dr. Beverely Low at [place].   PHYSICAL EXAMINATION:  GENERAL:  Very pleasant lady in moderate distress  with a complaint of pain, accompanied at this time by Dr. Felicity Coyer, Dr.  Graciela Husbands, and also her daughter.  She only complains of left hip pain.  EXTREMITIES:  Bilateral extremities are unremarkable other than left  shoulder limited range of motion secondary to chronic rotator cuff  deficiency, chronic in nature.  Left lower extremity slightly shortened  and externally rotated.  Foot and ankle, leg, knee unremarkable.  There  is marked pain with any type of movement of lower extremity.  Knee, no  palpable effusion.  Distal dorsalis pedis pulse 1-2+.  No evidence of  ischemia.  Good sensation to light touch.  Good motor function of the  foot and ankle.   Plain x-rays reviewed.  She has an acute left intertrochanteric hip  fracture with angulation.  INR is currently 3.   IMPRESSION:  1. Left hip intertrochanteric fracture.  2. History of atrial fibrillation with mechanical valve on chronic      Coumadin therapy.  3. Other medical problems as noted above.   RECOMMENDATIONS:  The patient is currently being admitted by Dr.  Felicity Coyer to the hospitalist.  Dr. Graciela Husbands is also managing from a  Cardiology standpoint.  I discussed with all of them the issues.  We  will reverse the INR with vitamin K to get her INR below 1.6, and then  she will undergo surgery tomorrow afternoon hopefully if the OR is  available as scheduled.  Today, I have discussed the risks and benefits  of surgery.  Dr. Charlann Boxer with proceed with more of this tomorrow.  I also  discussed with Dr. Graciela Husbands.  They would like  to anticoagulate with heparin  after surgery.  I will wait at least 24 hours.  Coumadin can be given  immediately.   Plan is open reduction and internal fixation of fracture with  intramedullary hip screw.  Questions were encouraged and answered.  I  discussed with Dr. Charlann Boxer, who will see tomorrow morning and resume care  at that time.           ______________________________  Erasmo Leventhal, M.D.     RAC/MEDQ  D:  04/10/2009  T:  04/11/2009  Job:  [id number]

## 2011-04-02 NOTE — Cardiovascular Report (Signed)
[name], Kelsey Cochran                            ACCOUNT NO.:  [id number]   MEDICAL RECORD NO.:  [id number]                   PATIENT TYPE:  OIB   LOCATION:  6501                                 FACILITY:  [facility]   PHYSICIAN:  Charlton Haws, M.D.                  DATE OF BIRTH:  19-Aug-1930   DATE OF PROCEDURE:  02/18/2004  DATE OF DISCHARGE:  02/18/2004                              CARDIAC CATHETERIZATION   Right and left heart catheterization was done to assess coronary artery  disease and MR.  The patient was recently hospitalized with heart failure.  She had significant MR.  Right heart catheterization was done to assess PA  and wedge pressure.   The patient previously was thought to have an 80% ostial right coronary  artery lesion.   PROCEDURE:  Standard catheterization was done from the right femoral artery  and vein.   The left main coronary artery has a 30% discrete lesion.   The left anterior descending artery had a smooth 50-60% stenosis proximally.  Distal vessel was normal size disease.   Circumflex coronary artery was normal.   The right coronary artery was engaged with a No-To catheter.  There was  minimal catheter damping I believe due to the takeoff of the conus branch.  However, I saw no ostial lesion.  The artery was large and there was maybe  20-30% distal RCA stenosis.   RAO VENTRICULOGRAPHY:  RAO ventriculography showed angiographic grade 3  mitral insufficiency.   Overall ejection fraction was in the 55% range.   LV pressure was 133/17, aortic pressure 145/60.   Right heart catheterization showed a mean right atrial pressure of 20, RV  pressure was elevated at 77/19.  PA pressure was 76/27, mean pulmonary  capillary wedge pressure was 44 with a large V wave.   IMPRESSION:  The patient's right heart pressures and large V wave would  suggest severe mitral insufficiency. Angiographically, it was a little less  than impressive without total opacification of  the LA.   The patient will be referred for TEE to further assess the degree of MR  which I do think is severe.  I think she will be referred for coronary  artery bypass grafting to the LAD and mitral valve repair.  The case was  discussed with Dr. Chales Abrahams.                                               Charlton Haws, M.D.    PN/MEDQ  D:  02/18/2004  T:  02/19/2004  Job:  [id number]

## 2011-04-02 NOTE — Assessment & Plan Note (Signed)
[facility]                                 ON-CALL NOTE   Kelsey Cochran, Kelsey Cochran                         MRN:          [id number]  DATE:10/30/2006                            DOB:          [birth year]    Ms. Every's daughter, [name], calls to report that [name] has  been having increasing shortness of breath over the past several days.  They thought she might have a virus, but her symptoms have not gotten  any better.  She has had some nausea, but no vomiting.  She has had  small amounts of lower extremity edema as well.  [name] has a history  of atrial fibrillation and diastolic congestive heart failure.  Ms.  Leard did go to work earlier today, and reports being comfortable at  rest.  She has been taking 80 mg of Lasix daily.  I instructed her to  take an extra 80 mg dose of Lasix and see if she improves, along with an  extra dose of potassium chloride 20 mEq.  I instructed the patient and  her daughter to have a very low threshold to present to the emergency  department if her symptoms do not improve.  I instructed her to call her  primary cardiologist, Dr. Eden Emms tomorrow, to arrange a clinic visit for  close followup.     Lowella Bandy, MD  Electronically Signed    JJC/MedQ  DD: 10/30/2006  DT: 10/30/2006  Job #: [id number]

## 2011-04-06 ENCOUNTER — Encounter: Payer: Medicare PPO | Admitting: Family Medicine

## 2011-04-08 ENCOUNTER — Encounter: Payer: Self-pay | Admitting: Family Medicine

## 2011-04-13 ENCOUNTER — Ambulatory Visit (INDEPENDENT_AMBULATORY_CARE_PROVIDER_SITE_OTHER): Payer: Medicare PPO | Admitting: *Deleted

## 2011-04-13 DIAGNOSIS — I059 Rheumatic mitral valve disease, unspecified: Secondary | ICD-10-CM

## 2011-04-13 DIAGNOSIS — I4891 Unspecified atrial fibrillation: Secondary | ICD-10-CM

## 2011-04-13 DIAGNOSIS — Z9889 Other specified postprocedural states: Secondary | ICD-10-CM

## 2011-04-13 LAB — POCT INR: INR: 3

## 2011-04-16 ENCOUNTER — Ambulatory Visit (INDEPENDENT_AMBULATORY_CARE_PROVIDER_SITE_OTHER): Payer: Medicare PPO | Admitting: Family Medicine

## 2011-04-16 VITALS — BP 114/70 | Temp 98.2°F | Ht 65.0 in | Wt 172.5 lb

## 2011-04-16 DIAGNOSIS — I10 Essential (primary) hypertension: Secondary | ICD-10-CM

## 2011-04-16 DIAGNOSIS — R5381 Other malaise: Secondary | ICD-10-CM

## 2011-04-16 DIAGNOSIS — E7800 Pure hypercholesterolemia, unspecified: Secondary | ICD-10-CM

## 2011-04-16 DIAGNOSIS — R5383 Other fatigue: Secondary | ICD-10-CM

## 2011-04-16 DIAGNOSIS — E119 Type 2 diabetes mellitus without complications: Secondary | ICD-10-CM

## 2011-04-16 DIAGNOSIS — Z0000 Encounter for general adult medical examination without abnormal findings: Secondary | ICD-10-CM

## 2011-04-16 DIAGNOSIS — T887XXA Unspecified adverse effect of drug or medicament, initial encounter: Secondary | ICD-10-CM

## 2011-04-16 DIAGNOSIS — E118 Type 2 diabetes mellitus with unspecified complications: Secondary | ICD-10-CM

## 2011-04-16 LAB — HEPATIC FUNCTION PANEL
AST: 17 U/L (ref 0–37)
Albumin: 4.4 g/dL (ref 3.5–5.2)
Alkaline Phosphatase: 83 U/L (ref 39–117)
Bilirubin, Direct: 0.2 mg/dL (ref 0.0–0.3)
Total Bilirubin: 0.8 mg/dL (ref 0.3–1.2)

## 2011-04-16 LAB — LIPID PANEL
HDL: 41.9 mg/dL (ref >39.00)
LDL Cholesterol: 102 mg/dL — ABNORMAL HIGH (ref 0–99)
Total CHOL/HDL Ratio: 4
Triglycerides: 168 mg/dL — ABNORMAL HIGH (ref 0.0–149.0)
VLDL: 33.6 mg/dL (ref 0.0–40.0)

## 2011-04-16 LAB — CBC WITH DIFFERENTIAL/PLATELET
Basophils Absolute: 0 10*3/uL (ref 0.0–0.1)
Eosinophils Relative: 3.5 % (ref 0.0–5.0)
MCV: 96.6 fl (ref 78.0–100.0)
Monocytes Absolute: 0.3 10*3/uL (ref 0.1–1.0)
Neutrophils Relative %: 76.8 % (ref 43.0–77.0)
Platelets: 139 10*3/uL — ABNORMAL LOW (ref 150.0–400.0)
WBC: 7.1 10*3/uL (ref 4.5–10.5)

## 2011-04-16 LAB — BASIC METABOLIC PANEL
GFR: 64.27 mL/min (ref >60.00)
Glucose, Bld: 108 mg/dL — ABNORMAL HIGH (ref 70–99)
Potassium: 3.7 mEq/L (ref 3.5–5.1)
Sodium: 141 mEq/L (ref 135–145)

## 2011-04-16 LAB — MICROALBUMIN / CREATININE URINE RATIO
Microalb Creat Ratio: 3.1 mg/g (ref 0.0–30.0)
Microalb, Ur: 4.7 mg/dL — ABNORMAL HIGH (ref 0.0–1.9)

## 2011-04-16 MED ORDER — LISINOPRIL 2.5 MG PO TABS: 2.5000 mg | ORAL_TABLET | Freq: Every day | ORAL | Status: DC

## 2011-04-16 MED ORDER — HYDROCODONE-ACETAMINOPHEN 5-500 MG PO TABS: 1.0000 | ORAL_TABLET | Freq: Three times a day (TID) | ORAL | Status: DC | PRN

## 2011-04-16 NOTE — Progress Notes (Signed)
  Subjective:    Patient ID: Kelsey Cochran, female    DOB: 11/09/1930, 77 y.o.   MRN: [id number]  HPI Here today for CPE.  Risk Factors: Fatigue- has appt w/ [name] on Monday.  Iron was stopped in March and pt has had progressive sxs for 'the last couple of weeks'. Diabetes- chronic problem, fasting CBGs have been 90-130.  Denies symptomatic lows, CP.  Increased SOB.  No edema, visual changes, abd pain.  On Glyburide. HTN- chronic problem, BP excellently controlled today.  Asymptomatic.  On Coreg, lasix, imdur, aldactone. Hyperlipidemia- chronic problem for pt, well controlled on Lipitor.  No abd pain, N/V, myalgias  Physical Activity: walking well w/ her cane, still participating in PT regularly Depression: denies sxs Hearing: normal to whispered voice ADL's: independent Cognitive: normal linear thought process, memory intact, no deficits noted Home Safety: feels safe at home, lives alone Height, Weight, BMI, Visual Acuity: see vitals, vision corrected to 20/20 w/ glasses Counseling: provided on health maintenance- refusing colonoscopy.  UTD on mammogram. Labs Ordered: See A&P Care Plan: See A&P    Review of Systems Patient reports no vision/ hearing changes, adenopathy, fever, weight change,  persistant/recurrent hoarseness , swallowing issues, chest pain, palpitations, edema, persistant/recurrent cough, hemoptysis, gastrointestinal bleeding (melena, rectal bleeding), abdominal pain, significant heartburn, bowel changes, GU symptoms (dysuria, hematuria, incontinence), Gyn symptoms (abnormal  bleeding, pain),  syncope, focal weakness, memory loss, numbness & tingling, skin/hair/nail changes, abnormal bruising or bleeding, anxiety, or depression.    Objective:   Physical Exam  General Appearance:    Alert, cooperative, no distress, appears stated age  Head:    Normocephalic, without obvious abnormality, atraumatic  Eyes:    PERRL, conjunctiva/corneas clear, EOM's intact, fundi   benign, both eyes  Ears:    Normal TM's and external ear canals, both ears  Nose:   Nares normal, septum midline, mucosa normal, no drainage    or sinus tenderness  Throat:   Lips, mucosa, and tongue normal; teeth and gums normal  Neck:   Supple, symmetrical, trachea midline, no adenopathy;    Thyroid: no enlargement/tenderness/nodules  Back:     Symmetric, no curvature, ROM normal, no CVA tenderness  Lungs:     Clear to auscultation bilaterally, respirations unlabored  Chest Wall:    No tenderness or deformity   Heart:    Irregular S1/S2  Breast Exam:    No tenderness, masses, or nipple abnormality  Abdomen:     Soft, non-tender, bowel sounds active all four quadrants,    no masses, no organomegaly  Genitalia:    Deferred  Rectal:    Deferred  Extremities:   Extremities normal, atraumatic, no cyanosis or edema  Pulses:   2+ and symmetric all extremities  Skin:   Skin color, texture, turgor normal, no rashes or lesions  Lymph nodes:   Cervical, supraclavicular, and axillary nodes normal  Neurologic:   CNII-XII intact, normal strength, sensation and reflexes    throughout          Assessment & Plan:

## 2011-04-16 NOTE — Patient Instructions (Signed)
Follow up in 3 months to recheck your diabetes We'll notify you of your lab results Call with any questions or concerns You look great!  We'll try and get to the bottom of why you don't feel good. Have a great summer!!!

## 2011-04-19 ENCOUNTER — Encounter (HOSPITAL_BASED_OUTPATIENT_CLINIC_OR_DEPARTMENT_OTHER): Payer: Medicare PPO | Admitting: Hematology & Oncology

## 2011-04-19 ENCOUNTER — Other Ambulatory Visit: Payer: Self-pay | Admitting: Hematology & Oncology

## 2011-04-19 DIAGNOSIS — D594 Other nonautoimmune hemolytic anemias: Secondary | ICD-10-CM

## 2011-04-19 DIAGNOSIS — Z954 Presence of other heart-valve replacement: Secondary | ICD-10-CM

## 2011-04-19 DIAGNOSIS — D649 Anemia, unspecified: Secondary | ICD-10-CM

## 2011-04-19 DIAGNOSIS — Z7901 Long term (current) use of anticoagulants: Secondary | ICD-10-CM

## 2011-04-20 LAB — RETICULOCYTES (CHCC)
ABS Retic: 58.6 10*3/uL (ref 19.0–186.0)
RBC.: 3.66 MIL/uL — ABNORMAL LOW (ref 3.87–5.11)

## 2011-04-20 LAB — IRON AND TIBC
Iron: 77 ug/dL (ref 42–145)
UIBC: 164 ug/dL

## 2011-04-20 LAB — HAPTOGLOBIN: Haptoglobin: 102 mg/dL (ref 30–200)

## 2011-04-27 ENCOUNTER — Encounter: Payer: Self-pay | Admitting: Cardiovascular Disease

## 2011-04-27 ENCOUNTER — Ambulatory Visit (INDEPENDENT_AMBULATORY_CARE_PROVIDER_SITE_OTHER): Payer: Medicare PPO | Admitting: Cardiovascular Disease

## 2011-04-27 DIAGNOSIS — Z9889 Other specified postprocedural states: Secondary | ICD-10-CM

## 2011-04-27 DIAGNOSIS — D649 Anemia, unspecified: Secondary | ICD-10-CM

## 2011-04-27 DIAGNOSIS — I4891 Unspecified atrial fibrillation: Secondary | ICD-10-CM

## 2011-04-27 DIAGNOSIS — I10 Essential (primary) hypertension: Secondary | ICD-10-CM

## 2011-04-27 DIAGNOSIS — I6529 Occlusion and stenosis of unspecified carotid artery: Secondary | ICD-10-CM

## 2011-04-27 DIAGNOSIS — E7800 Pure hypercholesterolemia, unspecified: Secondary | ICD-10-CM

## 2011-04-27 NOTE — Assessment & Plan Note (Signed)
Normal function by echo and exam.  Blood work shows no hemolysis

## 2011-04-27 NOTE — Progress Notes (Signed)
Kelsey Cochran is seen today for F/U of dyspnea, anticoagulaiton and MVR and carotid disease. Reviewed duplex from today and 60-79% RICA stenosis stable. . She has significant anemia with Hct 24 that is contributing to her fatigue and dyspnea. I reviewed her echo from 12/11 and EF normal with normal functioning MVR no evidence of perivalvular leak or other abnormality that could contribute to anemia via shearing effect/hemolysis. She apparantly has had Aranasp shots and F/U with [name] but I don't have these notes. Tried on lisinopril for proteinuria but could not tolerate for light headedness.  Encouraged her to F/U with [name] to find low dose substitute  Reviewed labs 11/04/10  Hb stable 10.7 with normal haptoglobin   ROS: Denies fever, malais, weight loss, blurry vision, decreased visual acuity, cough, sputum, SOB, hemoptysis, pleuritic pain, palpitaitons, heartburn, abdominal pain, melena, lower extremity edema, claudication, or rash.  All other systems reviewed and negative  General: Affect appropriate Healthy:  appears stated age HEENT: normal Neck supple with no adenopathy JVP normal no bruits no thyromegaly Lungs clear with no wheezing and good diaphragmatic motion Heart:  S1 click normal prosthetic MVR sound with no MR   /S2 no ,rub, gallop or click PMI normal Abdomen: benighn, BS positve, no tenderness, no AAA no bruit.  No HSM or HJR Distal pulses intact with no bruits No edema Neuro non-focal Skin warm and dry No muscular weakness   Current Outpatient Prescriptions  Medication Sig Dispense Refill  . acetaminophen (TYLENOL) 500 MG tablet Take 500 mg by mouth. As needed       . alendronate (FOSAMAX) 35 MG tablet Take 1 tablet (35 mg total) by mouth every 7 (seven) days. Take in the morning with a full glass of water, on an empty stomach, and do not take anything else by mouth or lie down for the next 30 min.  90 tablet  0  . aspirin 81 MG tablet Take 81 mg by mouth daily.          Marland Kitchen atorvastatin (LIPITOR) 20 MG tablet Take 20 mg by mouth daily.        . carvedilol (COREG) 25 MG tablet Take 25 mg by mouth 2 (two) times daily with meals.        . darbepoetin alfa-polysorbate (ARANESP, ALBUMIN FREE,) 60 MCG/ML injection Inject into the skin. As needed       . Diphenhydramine-APAP, sleep, (TYLENOL PM EXTRA STRENGTH PO) Take by mouth. As needed       . docusate sodium (COLACE) 100 MG capsule Take 100 mg by mouth 2 (two) times daily.        . folic acid (FOLVITE) 800 MCG tablet Take 400 mcg by mouth daily.        . furosemide (LASIX) 80 MG tablet Take 80 mg by mouth 2 (two) times daily.        Marland Kitchen glucose blood (TRUETEST TEST) test strip 1 each by Other route as needed. Test once daily       . glyBURIDE (DIABETA) 5 MG tablet Take 1 tablet (5 mg total) by mouth 2 (two) times daily.  180 tablet  0  . HYDROcodone-acetaminophen (VICODIN) 5-500 MG per tablet Take 1 tablet by mouth every 8 (eight) hours as needed for pain. 3 times a day as needed  60 tablet  1  . isosorbide mononitrate (IMDUR) 30 MG 24 hr tablet Take 30 mg by mouth daily.        Marland Kitchen lisinopril (PRINIVIL,ZESTRIL) 2.5 MG  tablet Take 1 tablet (2.5 mg total) by mouth daily.  30 tablet  3  . methocarbamol (ROBAXIN) 500 MG tablet Take 1 tablet (500 mg total) by mouth 2 (two) times daily. As needed  180 tablet  0  . spironolactone (ALDACTONE) 25 MG tablet Take 1 tablet (25 mg total) by mouth daily.  90 tablet  4  . warfarin (COUMADIN) 2.5 MG tablet Take 2.5 mg by mouth daily. As directed by Anticoagulation Clinic         Allergies  Heparin; Meperidine hcl; and Sulfamethoxazole w/trimethoprim  Electrocardiogram:  Assessment and Plan

## 2011-04-27 NOTE — Assessment & Plan Note (Signed)
Pt's PE WNL.  Check labs.  UTD on mammo, refusing colonoscopy.  Anticipatory guidance provided.

## 2011-04-27 NOTE — Patient Instructions (Signed)
Your physician recommends that you schedule a follow-up appointment in: 6 months  

## 2011-04-27 NOTE — Assessment & Plan Note (Signed)
Cholesterol is at goal.  Continue current dose of statin and diet Rx.  No myalgias or side effects.  F/U  LFT's in 6 months. Lab Results  Component Value Date   LDLCALC 102* 04/16/2011

## 2011-04-27 NOTE — Assessment & Plan Note (Signed)
Well controlled.  Continue current medications and low sodium Dash type diet.    

## 2011-04-27 NOTE — Assessment & Plan Note (Signed)
BP well controlled and pt asymptomatic but based on amount of protein in urine was important to start low dose ACE.  Reviewed possible side effects, including cough and angioedema.  Will follow.

## 2011-04-27 NOTE — Assessment & Plan Note (Signed)
Will arrange F/U duplex in 6 months.  RICA 60-79%

## 2011-04-27 NOTE — Assessment & Plan Note (Signed)
Tolerating meds w/out difficulty.  Check labs.  Adjust meds prn.

## 2011-04-27 NOTE — Assessment & Plan Note (Signed)
Pt's fatigue is likely multifactorial.  Has appt upcoming w/ [name] and Dr Eden Emms.  Check labs to r/o worsening anemia and/or thyroid issues.  Pt to call if sxs worsen.

## 2011-04-27 NOTE — Assessment & Plan Note (Signed)
Check labs, adjust meds prn.  Reviewed ADA diet and discussed importance of regular activity

## 2011-04-27 NOTE — Assessment & Plan Note (Signed)
Maint NSR with no palpitations  

## 2011-05-11 ENCOUNTER — Ambulatory Visit (INDEPENDENT_AMBULATORY_CARE_PROVIDER_SITE_OTHER): Payer: Medicare PPO | Admitting: *Deleted

## 2011-05-11 DIAGNOSIS — I059 Rheumatic mitral valve disease, unspecified: Secondary | ICD-10-CM

## 2011-05-11 DIAGNOSIS — I4891 Unspecified atrial fibrillation: Secondary | ICD-10-CM

## 2011-05-11 DIAGNOSIS — Z9889 Other specified postprocedural states: Secondary | ICD-10-CM

## 2011-05-11 LAB — POCT INR: INR: 2.7

## 2011-05-13 ENCOUNTER — Other Ambulatory Visit: Payer: Self-pay | Admitting: Family Medicine

## 2011-05-13 MED ORDER — GLYBURIDE 5 MG PO TABS: 5.0000 mg | ORAL_TABLET | Freq: Two times a day (BID) | ORAL | Status: DC

## 2011-05-13 NOTE — Telephone Encounter (Signed)
Refill sent.

## 2011-05-14 ENCOUNTER — Other Ambulatory Visit: Payer: Self-pay | Admitting: Family Medicine

## 2011-05-14 MED ORDER — METHOCARBAMOL 500 MG PO TABS: 500.0000 mg | ORAL_TABLET | Freq: Two times a day (BID) | ORAL | Status: DC

## 2011-05-14 NOTE — Telephone Encounter (Signed)
Ok for refill, #180

## 2011-05-14 NOTE — Telephone Encounter (Signed)
Last refilled in April. Please advise.

## 2011-05-14 NOTE — Telephone Encounter (Signed)
Refill sent.

## 2011-06-11 ENCOUNTER — Other Ambulatory Visit: Payer: Self-pay | Admitting: Cardiovascular Disease

## 2011-06-11 DIAGNOSIS — I6529 Occlusion and stenosis of unspecified carotid artery: Secondary | ICD-10-CM

## 2011-06-15 ENCOUNTER — Encounter (INDEPENDENT_AMBULATORY_CARE_PROVIDER_SITE_OTHER): Payer: Medicare PPO | Admitting: *Deleted

## 2011-06-15 ENCOUNTER — Ambulatory Visit (INDEPENDENT_AMBULATORY_CARE_PROVIDER_SITE_OTHER): Payer: Medicare PPO | Admitting: *Deleted

## 2011-06-15 DIAGNOSIS — I059 Rheumatic mitral valve disease, unspecified: Secondary | ICD-10-CM

## 2011-06-15 DIAGNOSIS — I4891 Unspecified atrial fibrillation: Secondary | ICD-10-CM

## 2011-06-15 DIAGNOSIS — Z9889 Other specified postprocedural states: Secondary | ICD-10-CM

## 2011-06-15 DIAGNOSIS — I6529 Occlusion and stenosis of unspecified carotid artery: Secondary | ICD-10-CM

## 2011-06-15 LAB — POCT INR: INR: 3.1

## 2011-06-18 ENCOUNTER — Encounter: Payer: Self-pay | Admitting: Cardiovascular Disease

## 2011-06-21 ENCOUNTER — Other Ambulatory Visit: Payer: Self-pay | Admitting: Hematology & Oncology

## 2011-06-21 ENCOUNTER — Encounter (HOSPITAL_BASED_OUTPATIENT_CLINIC_OR_DEPARTMENT_OTHER): Payer: Medicare PPO | Admitting: Hematology & Oncology

## 2011-06-21 DIAGNOSIS — Z954 Presence of other heart-valve replacement: Secondary | ICD-10-CM

## 2011-06-21 DIAGNOSIS — D594 Other nonautoimmune hemolytic anemias: Secondary | ICD-10-CM

## 2011-06-21 DIAGNOSIS — D649 Anemia, unspecified: Secondary | ICD-10-CM

## 2011-06-21 LAB — CBC WITH DIFFERENTIAL (CANCER CENTER ONLY)
BASO%: 0.4 % (ref 0.0–2.0)
Eosinophils Absolute: 0.4 10*3/uL (ref 0.0–0.5)
MONO#: 0.5 10*3/uL (ref 0.1–0.9)
NEUT#: 7.4 10*3/uL — ABNORMAL HIGH (ref 1.5–6.5)
Platelets: 143 10*3/uL — ABNORMAL LOW (ref 145–400)
RBC: 3.59 10*6/uL — ABNORMAL LOW (ref 3.70–5.32)
RDW: 13.5 % (ref 11.1–15.7)
WBC: 9.3 10*3/uL (ref 3.9–10.0)

## 2011-06-21 LAB — RETICULOCYTES (CHCC): Retic Ct Pct: 2.2 % (ref 0.4–2.3)

## 2011-06-21 LAB — IRON AND TIBC: Iron: 78 ug/dL (ref 42–145)

## 2011-06-21 LAB — FERRITIN: Ferritin: 1513 ng/mL — ABNORMAL HIGH (ref 10–291)

## 2011-06-21 LAB — CHCC SATELLITE - SMEAR

## 2011-07-13 ENCOUNTER — Ambulatory Visit (INDEPENDENT_AMBULATORY_CARE_PROVIDER_SITE_OTHER): Payer: Medicare PPO | Admitting: *Deleted

## 2011-07-13 DIAGNOSIS — Z9889 Other specified postprocedural states: Secondary | ICD-10-CM

## 2011-07-13 DIAGNOSIS — I059 Rheumatic mitral valve disease, unspecified: Secondary | ICD-10-CM

## 2011-07-13 DIAGNOSIS — I4891 Unspecified atrial fibrillation: Secondary | ICD-10-CM

## 2011-07-21 ENCOUNTER — Encounter: Payer: Self-pay | Admitting: Family Medicine

## 2011-07-21 ENCOUNTER — Ambulatory Visit (INDEPENDENT_AMBULATORY_CARE_PROVIDER_SITE_OTHER): Payer: Medicare PPO | Admitting: Family Medicine

## 2011-07-21 DIAGNOSIS — IMO0002 Reserved for concepts with insufficient information to code with codable children: Secondary | ICD-10-CM

## 2011-07-21 DIAGNOSIS — E118 Type 2 diabetes mellitus with unspecified complications: Secondary | ICD-10-CM

## 2011-07-21 DIAGNOSIS — I10 Essential (primary) hypertension: Secondary | ICD-10-CM

## 2011-07-21 DIAGNOSIS — E1165 Type 2 diabetes mellitus with hyperglycemia: Secondary | ICD-10-CM

## 2011-07-21 LAB — BASIC METABOLIC PANEL
BUN: 18 mg/dL (ref 6–23)
CO2: 26 mEq/L (ref 19–32)
Calcium: 8.6 mg/dL (ref 8.4–10.5)
GFR: 51.49 mL/min — ABNORMAL LOW (ref >60.00)
Glucose, Bld: 241 mg/dL — ABNORMAL HIGH (ref 70–99)

## 2011-07-21 MED ORDER — HYDROCODONE-ACETAMINOPHEN 5-500 MG PO TABS: 1.0000 | ORAL_TABLET | Freq: Three times a day (TID) | ORAL | Status: DC | PRN

## 2011-07-21 NOTE — Assessment & Plan Note (Signed)
BP well controlled.  Taking ACE every other day.  Check labs and continue for renal protection.  No changes at this time.  Continue to follow.

## 2011-07-21 NOTE — Patient Instructions (Signed)
Follow up in 3 months to recheck sugar and cholesterol We'll notify you of your lab results Call with any questions or concerns Keep up the good work!  You look great! Ask your insurance about the Shingles and Whooping cough vaccines and let me know!

## 2011-07-21 NOTE — Progress Notes (Signed)
  Subjective:    Patient ID: Kelsey Cochran, female    DOB: [birth year], 77 y.o.   MRN: [id number]  HPI DM- chronic problem, CBG was 78 this AM.  CBGs running 70s-120s.  Denies CP, SOB above baseline, HAs, visual changes, edema.  HTN- chronic problem, excellent control.  On Coreg, Lisinopril, Aldactone, lasix.  Taking Lisinopril every other night.  Asymptomatic.   Review of Systems For ROS see HPI     Objective:   Physical Exam  Vitals reviewed. Constitutional: She is oriented to person, place, and time. She appears well-developed and well-nourished. No distress.  HENT:  Head: Normocephalic and atraumatic.  Eyes: Conjunctivae and EOM are normal. Pupils are equal, round, and reactive to light.  Neck: Normal range of motion. Neck supple. No thyromegaly present.  Cardiovascular: Normal rate, regular rhythm and intact distal pulses.   Murmur (II/VI SEM) heard. Pulmonary/Chest: Effort normal and breath sounds normal. No respiratory distress.  Abdominal: Soft. She exhibits no distension. There is no tenderness.  Musculoskeletal: She exhibits no edema.  Lymphadenopathy:    She has no cervical adenopathy.  Neurological: She is alert and oriented to person, place, and time. No cranial nerve deficit.  Skin: Skin is warm and dry.  Psychiatric: She has a normal mood and affect. Her behavior is normal.          Assessment & Plan:

## 2011-07-21 NOTE — Assessment & Plan Note (Signed)
According to CBGs, pt's sugar is well controlled.  Check A1C and adjust meds prn.  UTD on eye exam.  Normal foot exam today.

## 2011-07-22 ENCOUNTER — Telehealth: Payer: Self-pay

## 2011-07-22 NOTE — Telephone Encounter (Signed)
labs mailed

## 2011-07-22 NOTE — Telephone Encounter (Signed)
Message copied by Beverely Low on Thu Jul 22, 2011  8:55 AM ------      Message from: Sheliah Hatch      Created: Wed Jul 21, 2011  1:45 PM       A1C is stable.  No med changes at this time.  Keep up the good work!

## 2011-07-28 ENCOUNTER — Other Ambulatory Visit: Payer: Self-pay | Admitting: *Deleted

## 2011-07-28 MED ORDER — ISOSORBIDE MONONITRATE ER 30 MG PO TB24: 30.0000 mg | ORAL_TABLET | Freq: Every day | ORAL | Status: DC

## 2011-07-28 MED ORDER — FUROSEMIDE 80 MG PO TABS: 80.0000 mg | ORAL_TABLET | Freq: Two times a day (BID) | ORAL | Status: DC

## 2011-07-28 MED ORDER — CARVEDILOL 25 MG PO TABS: 25.0000 mg | ORAL_TABLET | Freq: Two times a day (BID) | ORAL | Status: DC

## 2011-08-02 ENCOUNTER — Telehealth: Payer: Self-pay | Admitting: Cardiovascular Disease

## 2011-08-02 ENCOUNTER — Other Ambulatory Visit: Payer: Self-pay | Admitting: *Deleted

## 2011-08-02 DIAGNOSIS — E1165 Type 2 diabetes mellitus with hyperglycemia: Secondary | ICD-10-CM

## 2011-08-02 MED ORDER — LISINOPRIL 2.5 MG PO TABS: 2.5000 mg | ORAL_TABLET | Freq: Every day | ORAL | Status: DC

## 2011-08-02 MED ORDER — GLYBURIDE 5 MG PO TABS: 5.0000 mg | ORAL_TABLET | Freq: Two times a day (BID) | ORAL | Status: DC

## 2011-08-02 NOTE — Telephone Encounter (Signed)
Rx printed and faxed.  

## 2011-08-02 NOTE — Telephone Encounter (Signed)
Spoke with pt, she had her last carotid dopplers in July. She is not due until after 12-14-2011 for repeat. She has to reschedule appt with dr Eden Emms and will wait to see dr Eden Emms until Vanessa Kick when dopplers are due Kelsey Cochran

## 2011-08-02 NOTE — Telephone Encounter (Signed)
Pt calling in regards to carotid doppler. Pt said someone called her and scheduled on 12/13. I cannot find appt for this pt for this procedure. Please return pt call to clarify/ discuss further.

## 2011-08-05 ENCOUNTER — Other Ambulatory Visit: Payer: Self-pay

## 2011-08-05 MED ORDER — WARFARIN SODIUM 2.5 MG PO TABS: ORAL_TABLET | ORAL | Status: DC

## 2011-08-10 ENCOUNTER — Ambulatory Visit (INDEPENDENT_AMBULATORY_CARE_PROVIDER_SITE_OTHER): Payer: Medicare PPO | Admitting: *Deleted

## 2011-08-10 DIAGNOSIS — I4891 Unspecified atrial fibrillation: Secondary | ICD-10-CM

## 2011-08-10 DIAGNOSIS — Z9889 Other specified postprocedural states: Secondary | ICD-10-CM

## 2011-08-10 DIAGNOSIS — I059 Rheumatic mitral valve disease, unspecified: Secondary | ICD-10-CM

## 2011-08-27 LAB — URINALYSIS, ROUTINE W REFLEX MICROSCOPIC
Bilirubin Urine: NEGATIVE
Glucose, UA: NEGATIVE
Protein, ur: NEGATIVE
Urobilinogen, UA: 0.2

## 2011-08-27 LAB — URINE MICROSCOPIC-ADD ON

## 2011-08-27 LAB — BASIC METABOLIC PANEL
BUN: 11
Chloride: 103
GFR calc non Af Amer: >60
Potassium: 3.9
Sodium: 142

## 2011-08-27 LAB — HEMOGLOBIN AND HEMATOCRIT, BLOOD: Hemoglobin: 13

## 2011-08-27 LAB — PROTIME-INR
INR: 1.7 — ABNORMAL HIGH
Prothrombin Time: 20.2 — ABNORMAL HIGH

## 2011-08-31 ENCOUNTER — Ambulatory Visit (INDEPENDENT_AMBULATORY_CARE_PROVIDER_SITE_OTHER): Payer: Medicare PPO | Admitting: *Deleted

## 2011-08-31 DIAGNOSIS — I059 Rheumatic mitral valve disease, unspecified: Secondary | ICD-10-CM

## 2011-08-31 DIAGNOSIS — Z9889 Other specified postprocedural states: Secondary | ICD-10-CM

## 2011-08-31 DIAGNOSIS — I4891 Unspecified atrial fibrillation: Secondary | ICD-10-CM

## 2011-09-03 ENCOUNTER — Other Ambulatory Visit: Payer: Self-pay | Admitting: Family Medicine

## 2011-09-03 MED ORDER — ATORVASTATIN CALCIUM 20 MG PO TABS: 20.0000 mg | ORAL_TABLET | Freq: Every day | ORAL | Status: DC

## 2011-09-03 NOTE — Telephone Encounter (Signed)
Pt requests lipitor refill last labs was on 04-16-11 LDL 102 and Tri 168, all other labs WNL, please advise when next labs due and if it is ok to refill

## 2011-09-03 NOTE — Telephone Encounter (Signed)
Labs are q6- so will be due in Dec (she's always on top of it) and she can have 6 refills

## 2011-09-14 ENCOUNTER — Ambulatory Visit (INDEPENDENT_AMBULATORY_CARE_PROVIDER_SITE_OTHER): Payer: Medicare PPO | Admitting: *Deleted

## 2011-09-14 DIAGNOSIS — Z9889 Other specified postprocedural states: Secondary | ICD-10-CM

## 2011-09-14 DIAGNOSIS — I059 Rheumatic mitral valve disease, unspecified: Secondary | ICD-10-CM

## 2011-09-14 DIAGNOSIS — I4891 Unspecified atrial fibrillation: Secondary | ICD-10-CM

## 2011-09-14 LAB — POCT INR: INR: 2.6

## 2011-10-05 ENCOUNTER — Ambulatory Visit (INDEPENDENT_AMBULATORY_CARE_PROVIDER_SITE_OTHER): Payer: Medicare PPO | Admitting: *Deleted

## 2011-10-05 DIAGNOSIS — Z9889 Other specified postprocedural states: Secondary | ICD-10-CM

## 2011-10-05 DIAGNOSIS — I4891 Unspecified atrial fibrillation: Secondary | ICD-10-CM

## 2011-10-05 DIAGNOSIS — I059 Rheumatic mitral valve disease, unspecified: Secondary | ICD-10-CM

## 2011-10-05 LAB — POCT INR: INR: 2.8

## 2011-10-19 ENCOUNTER — Telehealth: Payer: Self-pay | Admitting: Cardiovascular Disease

## 2011-10-19 NOTE — Telephone Encounter (Signed)
Spoke with Kelsey Cochran, she wants to wait to see dr Eden Emms in feb when her carotids are due. Carotids and follow up scheduled [name]

## 2011-10-19 NOTE — Telephone Encounter (Signed)
New Msg: Pt calling wanting to know if pt needs to have carotid same day pt schedules appt with Dr. Eden Emms. Please return pt call to discuss further.

## 2011-10-20 ENCOUNTER — Encounter: Payer: Self-pay | Admitting: Family Medicine

## 2011-10-20 ENCOUNTER — Ambulatory Visit (INDEPENDENT_AMBULATORY_CARE_PROVIDER_SITE_OTHER): Payer: Medicare PPO | Admitting: Family Medicine

## 2011-10-20 DIAGNOSIS — IMO0002 Reserved for concepts with insufficient information to code with codable children: Secondary | ICD-10-CM

## 2011-10-20 DIAGNOSIS — I10 Essential (primary) hypertension: Secondary | ICD-10-CM

## 2011-10-20 DIAGNOSIS — E7800 Pure hypercholesterolemia, unspecified: Secondary | ICD-10-CM

## 2011-10-20 DIAGNOSIS — E1165 Type 2 diabetes mellitus with hyperglycemia: Secondary | ICD-10-CM

## 2011-10-20 DIAGNOSIS — E118 Type 2 diabetes mellitus with unspecified complications: Secondary | ICD-10-CM

## 2011-10-20 LAB — BASIC METABOLIC PANEL
CO2: 26 mEq/L (ref 19–32)
Calcium: 9.2 mg/dL (ref 8.4–10.5)
Chloride: 101 mEq/L (ref 96–112)
Sodium: 138 mEq/L (ref 135–145)

## 2011-10-20 LAB — LIPID PANEL
LDL Cholesterol: 98 mg/dL (ref 0–99)
Total CHOL/HDL Ratio: 4
Triglycerides: 172 mg/dL — ABNORMAL HIGH (ref 0.0–149.0)
VLDL: 34.4 mg/dL (ref 0.0–40.0)

## 2011-10-20 LAB — HEPATIC FUNCTION PANEL
ALT: 11 U/L (ref 0–35)
AST: 17 U/L (ref 0–37)
Bilirubin, Direct: 0.1 mg/dL (ref 0.0–0.3)
Total Bilirubin: 0.9 mg/dL (ref 0.3–1.2)

## 2011-10-20 LAB — HEMOGLOBIN A1C: Hgb A1c MFr Bld: 7.4 % — ABNORMAL HIGH (ref 4.6–6.5)

## 2011-10-20 MED ORDER — HYDROCODONE-ACETAMINOPHEN 5-500 MG PO TABS: 1.0000 | ORAL_TABLET | Freq: Three times a day (TID) | ORAL | Status: DC | PRN

## 2011-10-20 MED ORDER — METHOCARBAMOL 500 MG PO TABS: 500.0000 mg | ORAL_TABLET | Freq: Two times a day (BID) | ORAL | Status: DC

## 2011-10-20 NOTE — Patient Instructions (Signed)
Follow up in 3 months to recheck sugars We'll notify you of your lab results and make any changes if needed Call with any questions or concerns You look great! Happy Holidays!!

## 2011-10-20 NOTE — Progress Notes (Signed)
  Subjective:    Patient ID: Kelsey Cochran, female    DOB: October 14, 1930, 77 y.o.   MRN: [id number]  HPI DM- chronic problem, typically well controlled.  On Glyburide.  CBGs running 80s-140s.  No symptomatic lows.  UTD on eye exam.  No neuropathy.  HTN- chronic problem, excellent control today.  On Coreg, Lasix, Lisinopril, spironolactone.  No CP, no SOB above baseline, HAs, visual changes, edema.  Hyperlipidemia- chronic problem, on lipitor.  No abd pain, N/V, myalgias   Review of Systems For ROS see HPI     Objective:   Physical Exam  Vitals reviewed. Constitutional: She is oriented to person, place, and time. She appears well-developed and well-nourished. No distress.  HENT:  Head: Normocephalic and atraumatic.  Eyes: Conjunctivae and EOM are normal. Pupils are equal, round, and reactive to light.  Neck: Normal range of motion. Neck supple. No thyromegaly present.  Cardiovascular: Normal rate, regular rhythm and intact distal pulses.   Murmur (II/VI) heard. Pulmonary/Chest: Effort normal and breath sounds normal. No respiratory distress.  Abdominal: Soft. She exhibits no distension. There is no tenderness.  Musculoskeletal: She exhibits no edema.  Lymphadenopathy:    She has no cervical adenopathy.  Neurological: She is alert and oriented to person, place, and time.  Skin: Skin is warm and dry.  Psychiatric: She has a normal mood and affect. Her behavior is normal.          Assessment & Plan:

## 2011-10-26 NOTE — Assessment & Plan Note (Signed)
Chronic problem.  Well controlled on current meds.  Asymptomatic.  No changes. 

## 2011-10-26 NOTE — Assessment & Plan Note (Signed)
Chronic problem.  Tolerating statin w/out difficulty.  Check labs.  Adjust meds prn  

## 2011-10-26 NOTE — Assessment & Plan Note (Signed)
Chronic problem.  UTD on eye exam.  Tolerating meds w/out difficulty.  Asymptomatic.  Check labs.  Adjust meds prn

## 2011-10-28 ENCOUNTER — Ambulatory Visit: Payer: Medicare PPO | Admitting: Cardiovascular Disease

## 2011-11-02 ENCOUNTER — Ambulatory Visit (INDEPENDENT_AMBULATORY_CARE_PROVIDER_SITE_OTHER): Payer: Medicare PPO | Admitting: *Deleted

## 2011-11-02 DIAGNOSIS — Z9889 Other specified postprocedural states: Secondary | ICD-10-CM

## 2011-11-02 DIAGNOSIS — I059 Rheumatic mitral valve disease, unspecified: Secondary | ICD-10-CM

## 2011-11-02 DIAGNOSIS — I4891 Unspecified atrial fibrillation: Secondary | ICD-10-CM

## 2011-11-15 ENCOUNTER — Other Ambulatory Visit: Payer: Self-pay | Admitting: Family Medicine

## 2011-11-15 MED ORDER — GLUCOSE BLOOD VI STRP: ORAL_STRIP | Status: DC

## 2011-11-15 NOTE — Telephone Encounter (Signed)
rx sent to pharmacy by e-script  

## 2011-11-17 ENCOUNTER — Other Ambulatory Visit: Payer: Self-pay | Admitting: *Deleted

## 2011-11-17 MED ORDER — GLUCOSE BLOOD VI STRP: ORAL_STRIP | Status: DC

## 2011-11-17 NOTE — Telephone Encounter (Signed)
Pt left vm to advise she is almost out of her test strips, called pt to advise that this refill was indeed sent on 11-15-11 to [facility], pt advised that she has the strips sent to [facility] on [address], advised correction had been made and strips have been sent out

## 2011-11-22 ENCOUNTER — Ambulatory Visit (HOSPITAL_BASED_OUTPATIENT_CLINIC_OR_DEPARTMENT_OTHER): Payer: Medicare PPO | Admitting: Hematology & Oncology

## 2011-11-22 ENCOUNTER — Ambulatory Visit (INDEPENDENT_AMBULATORY_CARE_PROVIDER_SITE_OTHER): Payer: Self-pay | Admitting: Cardiovascular Disease

## 2011-11-22 ENCOUNTER — Other Ambulatory Visit: Payer: Self-pay | Admitting: Hematology & Oncology

## 2011-11-22 ENCOUNTER — Other Ambulatory Visit (HOSPITAL_BASED_OUTPATIENT_CLINIC_OR_DEPARTMENT_OTHER): Payer: Medicare PPO | Admitting: Lab

## 2011-11-22 VITALS — BP 98/61 | HR 71 | Temp 97.6°F | Ht 64.0 in | Wt 176.0 lb

## 2011-11-22 DIAGNOSIS — I4891 Unspecified atrial fibrillation: Secondary | ICD-10-CM

## 2011-11-22 DIAGNOSIS — I059 Rheumatic mitral valve disease, unspecified: Secondary | ICD-10-CM

## 2011-11-22 DIAGNOSIS — D649 Anemia, unspecified: Secondary | ICD-10-CM

## 2011-11-22 DIAGNOSIS — R0989 Other specified symptoms and signs involving the circulatory and respiratory systems: Secondary | ICD-10-CM

## 2011-11-22 DIAGNOSIS — D594 Other nonautoimmune hemolytic anemias: Secondary | ICD-10-CM

## 2011-11-22 DIAGNOSIS — Z954 Presence of other heart-valve replacement: Secondary | ICD-10-CM

## 2011-11-22 DIAGNOSIS — Z9889 Other specified postprocedural states: Secondary | ICD-10-CM

## 2011-11-22 LAB — CBC WITH DIFFERENTIAL (CANCER CENTER ONLY)
BASO%: 0.3 % (ref 0.0–2.0)
EOS%: 3.3 % (ref 0.0–7.0)
Eosinophils Absolute: 0.3 10*3/uL (ref 0.0–0.5)
LYMPH%: 10.3 % — ABNORMAL LOW (ref 14.0–48.0)
MCH: 32.2 pg (ref 26.0–34.0)
MCHC: 33.1 g/dL (ref 32.0–36.0)
MCV: 97 fL (ref 81–101)
MONO%: 5.2 % (ref 0.0–13.0)
NEUT#: 6.2 10*3/uL (ref 1.5–6.5)
Platelets: 139 10*3/uL — ABNORMAL LOW (ref 145–400)
RBC: 3.88 10*6/uL (ref 3.70–5.32)
RDW: 13.4 % (ref 11.1–15.7)

## 2011-11-22 LAB — RETICULOCYTES (CHCC)
ABS Retic: 81.7 10*3/uL (ref 19.0–186.0)
RBC.: 3.89 MIL/uL (ref 3.87–5.11)
Retic Ct Pct: 2.1 % (ref 0.4–2.3)

## 2011-11-22 LAB — CHCC SATELLITE - SMEAR

## 2011-11-22 LAB — PROTIME-INR (CHCC SATELLITE): Protime: 31.2 Seconds — ABNORMAL HIGH (ref 10.6–13.4)

## 2011-11-22 LAB — POCT INR: INR: 2.6

## 2011-11-22 LAB — FERRITIN: Ferritin: 1628 ng/mL — ABNORMAL HIGH (ref 10–291)

## 2011-11-22 NOTE — Progress Notes (Signed)
CC:   Neena Rhymes, M.D. [name]. Eden Emms, MD, [facility]  DIAGNOSES: 1. 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 2. Intermittent iron deficiency anemia. 3. Chronic anticoagulation with Coumadin.  CURRENT THERAPY: 1. Folic acid 1 mg p.o. daily. 2. IV iron as indicated. 3. Lifelong Coumadin.  INTERIM HISTORY:  Kelsey Cochran comes in for followup.  She is really doing well.  She got through the Christmas holidays okay.  One of her daughters has the flu.  She has had no problems with bleeding.  She had 1 large hematoma involving the right biceps probably a year or so ago.  This actually was back in December 2011.  She has gotten through this.  She is doing well with the Coumadin.  She has not noticed any melena or bright red blood per rectum.  When we last saw her in August, her ferritin was 1513.  A lot of this is being acute phase reactant.  Her iron saturation was 32%.  PHYSICAL EXAM:  General:  This is a well-developed well-nourished white female in no obvious distress.  Vital Signs:  Temperature 97.6, pulse 71, respiratory rate 16, blood pressure 97/61.  Weight is 176. Head/Neck:  Exam shows a normocephalic, atraumatic skull.  There are no ocular or oral lesions.  There are no palpable cervical or supraclavicular lymph nodes.  Lungs:  Clear bilaterally.  Cardiac: Irregular rate and rhythm.  rhythm.  She has a systolic click.  Abdomen: Soft.  Good bowel sounds.  There is no fluid wave.  No palpable hepatosplenomegaly.  Extremities:  Some trace edema in her legs.  Skin: Exam shows no rashes, ecchymosis or petechiae.  Neurologic:  Exam shows no focal neurological deficits.  LABORATORY STUDIES:  White cell count 7.7, hemoglobin 12.5, hematocrit 37.8, platelet count 139.  INR is 2.6.  IMPRESSION:  [name] is 78 year old white female with multifactorial anemia.  She does have some chronic microangiopathic hemolysis.  She is on folic acid and doing  well.  Her blood counts really look great.  We will go ahead and plan to get back in 4 months.  I do not see that we need to do any blood work on her in between visits.    ______________________________ Josph Macho, M.D. PRE/MEDQ  D:  11/22/2011  T:  11/22/2011  Job:  [id number]

## 2011-11-22 NOTE — Progress Notes (Signed)
This office note has been dictated.

## 2011-11-23 ENCOUNTER — Encounter: Payer: Medicare PPO | Admitting: *Deleted

## 2011-11-28 LAB — IRON AND TIBC
%SAT: 28 % (ref 20–55)
TIBC: 264 ug/dL (ref 250–470)

## 2011-11-28 LAB — TRANSFERRIN RECEPTOR, SOLUABLE: Transferrin Receptor, Soluble: 1.54 mg/L (ref 0.76–1.76)

## 2011-11-28 LAB — RETICULOCYTES (CHCC): Retic Ct Pct: 2.1 % (ref 0.4–2.3)

## 2011-12-01 ENCOUNTER — Other Ambulatory Visit: Payer: Self-pay | Admitting: Family Medicine

## 2011-12-01 MED ORDER — ATORVASTATIN CALCIUM 20 MG PO TABS: 20.0000 mg | ORAL_TABLET | Freq: Every day | ORAL | Status: DC

## 2011-12-01 NOTE — Telephone Encounter (Signed)
rx sent to pharmacy by e-script  

## 2011-12-13 ENCOUNTER — Other Ambulatory Visit: Payer: Self-pay | Admitting: Family Medicine

## 2011-12-13 MED ORDER — ATORVASTATIN CALCIUM 20 MG PO TABS: 20.0000 mg | ORAL_TABLET | Freq: Every day | ORAL | Status: DC

## 2011-12-13 NOTE — Telephone Encounter (Signed)
rx sent to pharmacy by e-script  

## 2011-12-21 ENCOUNTER — Ambulatory Visit (INDEPENDENT_AMBULATORY_CARE_PROVIDER_SITE_OTHER): Payer: Medicare PPO | Admitting: *Deleted

## 2011-12-21 DIAGNOSIS — I4891 Unspecified atrial fibrillation: Secondary | ICD-10-CM

## 2011-12-21 DIAGNOSIS — Z9889 Other specified postprocedural states: Secondary | ICD-10-CM

## 2011-12-21 DIAGNOSIS — I059 Rheumatic mitral valve disease, unspecified: Secondary | ICD-10-CM

## 2011-12-21 MED ORDER — WARFARIN SODIUM 2.5 MG PO TABS: ORAL_TABLET | ORAL | Status: DC

## 2011-12-27 ENCOUNTER — Other Ambulatory Visit: Payer: Self-pay | Admitting: Family Medicine

## 2011-12-28 MED ORDER — GLYBURIDE 5 MG PO TABS: 5.0000 mg | ORAL_TABLET | Freq: Two times a day (BID) | ORAL | Status: DC

## 2011-12-28 NOTE — Telephone Encounter (Signed)
rx sent to pharmacy by e-script [facility]

## 2011-12-29 ENCOUNTER — Other Ambulatory Visit: Payer: Self-pay | Admitting: Family Medicine

## 2011-12-29 MED ORDER — GLYBURIDE 5 MG PO TABS: 5.0000 mg | ORAL_TABLET | Freq: Two times a day (BID) | ORAL | Status: DC

## 2011-12-29 NOTE — Telephone Encounter (Signed)
The pt is hoping to get a refill of Glyburide 5mg  tablets (Qty 180) Last filled on 12/28/11  Thanks!

## 2011-12-29 NOTE — Telephone Encounter (Signed)
rx sent to pharmacy by e-script  

## 2012-01-10 ENCOUNTER — Encounter: Payer: Self-pay | Admitting: Cardiovascular Disease

## 2012-01-10 ENCOUNTER — Encounter (INDEPENDENT_AMBULATORY_CARE_PROVIDER_SITE_OTHER): Payer: Medicare PPO

## 2012-01-10 ENCOUNTER — Ambulatory Visit (INDEPENDENT_AMBULATORY_CARE_PROVIDER_SITE_OTHER): Payer: Medicare PPO | Admitting: Cardiovascular Disease

## 2012-01-10 VITALS — BP 128/84 | HR 73 | Wt 173.0 lb

## 2012-01-10 DIAGNOSIS — E7800 Pure hypercholesterolemia, unspecified: Secondary | ICD-10-CM

## 2012-01-10 DIAGNOSIS — I10 Essential (primary) hypertension: Secondary | ICD-10-CM

## 2012-01-10 DIAGNOSIS — D649 Anemia, unspecified: Secondary | ICD-10-CM

## 2012-01-10 DIAGNOSIS — E1165 Type 2 diabetes mellitus with hyperglycemia: Secondary | ICD-10-CM

## 2012-01-10 DIAGNOSIS — Z9889 Other specified postprocedural states: Secondary | ICD-10-CM

## 2012-01-10 DIAGNOSIS — R0602 Shortness of breath: Secondary | ICD-10-CM

## 2012-01-10 DIAGNOSIS — I6529 Occlusion and stenosis of unspecified carotid artery: Secondary | ICD-10-CM

## 2012-01-10 MED ORDER — AMOXICILLIN 500 MG PO CAPS: 500.0000 mg | ORAL_CAPSULE | ORAL | Status: AC | PRN

## 2012-01-10 NOTE — Progress Notes (Signed)
Kelsey Cochran is seen today for F/U of dyspnea, anticoagulaiton and MVR and carotid disease. Reviewed duplex from today and 60-79% RICA stenosis stable. . She has significant anemia with Hct 24 that is contributing to her fatigue and dyspnea. I reviewed her echo from 12/11 and EF normal with normal functioning MVR no evidence of perivalvular leak or other abnormality that could contribute to anemia via shearing effect/hemolysis. She apparantly has had Aranasp shots and F/U with [name] but I don't have these notes. Tried on lisinopril for proteinuria but could not tolerate for light headedness. Encouraged her to F/U with [name] to find low dose substitute   Hct 37.8 a month ago improved  Still dyspnic despite resolution of anemia.  Needs F/U CXR will let [name] order 3/5.    Reviewed carotid duplex from today Stable 60-79% RICA  Needs F/U echo for valve  ROS: Denies fever, malais, weight loss, blurry vision, decreased visual acuity, cough, sputum, SOB, hemoptysis, pleuritic pain, palpitaitons, heartburn, abdominal pain, melena, lower extremity edema, claudication, or rash.  All other systems reviewed and negative  General: Affect appropriate Healthy:  appears stated age HEENT: normal Neck supple with no adenopathy JVP normal no bruits no thyromegaly Lungs clear with no wheezing and good diaphragmatic motion Heart:  S1 click /S2 no murmur, no rub, gallop or click PMI normal Abdomen: benighn, BS positve, no tenderness, no AAA no bruit.  No HSM or HJR Distal pulses intact with no bruits No edema Neuro non-focal Skin warm and dry No muscular weakness   Current Outpatient Prescriptions  Medication Sig Dispense Refill  . acetaminophen (TYLENOL) 500 MG tablet Take 500 mg by mouth. As needed       . alendronate (FOSAMAX) 35 MG tablet Take 1 tablet (35 mg total) by mouth every 7 (seven) days. Take in the morning with a full glass of water, on an empty stomach, and do not take anything else by  mouth or lie down for the next 30 min.  90 tablet  0  . aspirin 81 MG tablet Take 81 mg by mouth daily.        Marland Kitchen atorvastatin (LIPITOR) 20 MG tablet Take 1 tablet (20 mg total) by mouth daily.  30 tablet  2  . carvedilol (COREG) 25 MG tablet Take 1 tablet (25 mg total) by mouth 2 (two) times daily with a meal.  180 tablet  2  . darbepoetin alfa-polysorbate (ARANESP, ALBUMIN FREE,) 60 MCG/ML injection Inject into the skin. As needed       . Diphenhydramine-APAP, sleep, (TYLENOL PM EXTRA STRENGTH PO) Take by mouth. As needed       . docusate sodium (COLACE) 100 MG capsule Take 100 mg by mouth 2 (two) times daily.        . folic acid (FOLVITE) 800 MCG tablet Take 400 mcg by mouth daily.        . furosemide (LASIX) 80 MG tablet Take 1 tablet (80 mg total) by mouth 2 (two) times daily.  180 tablet  2  . glucose blood (TRUETEST TEST) test strip Test once daily  100 each  3  . glyBURIDE (DIABETA) 5 MG tablet Take 1 tablet (5 mg total) by mouth 2 (two) times daily.  180 tablet  0  . HYDROcodone-acetaminophen (VICODIN) 5-500 MG per tablet Take 1 tablet by mouth every 8 (eight) hours as needed for pain. 3 times a day as needed  60 tablet  1  . isosorbide mononitrate (IMDUR) 30 MG  24 hr tablet Take 1 tablet (30 mg total) by mouth daily.  90 tablet  2  . lisinopril (PRINIVIL,ZESTRIL) 2.5 MG tablet Take 1 tablet (2.5 mg total) by mouth daily.  90 tablet  1  . methocarbamol (ROBAXIN) 500 MG tablet Take 1 tablet (500 mg total) by mouth 2 (two) times daily. As needed  180 tablet  0  . spironolactone (ALDACTONE) 25 MG tablet Take 1 tablet (25 mg total) by mouth daily.  90 tablet  4  . warfarin (COUMADIN) 2.5 MG tablet As directed by Anticoagulation Clinic  45 tablet  3    Allergies  Heparin; Meperidine hcl; and Sulfamethoxazole w/trimethoprim  Electrocardiogram:  Assessment and Plan

## 2012-01-10 NOTE — Assessment & Plan Note (Signed)
Improved  F/U Dr Monika Salk

## 2012-01-10 NOTE — Assessment & Plan Note (Signed)
Well controlled.  Continue current medications and low sodium Dash type diet.    

## 2012-01-10 NOTE — Patient Instructions (Signed)
Your physician wants you to follow-up in:  6 MONTHS WITH DR Haywood Filler will receive a reminder letter in the mail two months in advance. If you don't receive a letter, please call our office to schedule the follow-up appointment. Your physician recommends that you continue on your current medications as directed. Please refer to the Current Medication list given to you today. Your physician has requested that you have an echocardiogram. Echocardiography is a painless test that uses sound waves to create images of your heart. It provides your doctor with information about the size and shape of your heart and how well your heart's chambers and valves are working. This procedure takes approximately one hour. There are no restrictions for this procedure. DX DYSPNEA AND MVR  HAVE DR TOBORI  SCHEDULE CXR

## 2012-01-10 NOTE — Assessment & Plan Note (Signed)
Cholesterol is at goal.  Continue current dose of statin and diet Rx.  No myalgias or side effects.  F/U  LFT's in 6 months. Lab Results  Component Value Date   LDLCALC 98 10/20/2011

## 2012-01-10 NOTE — Assessment & Plan Note (Signed)
Etiology unclear.  F/U echo for EF and MVR have been normal.  CXR should be done by primary.  Anemia improved.

## 2012-01-10 NOTE — Assessment & Plan Note (Signed)
Normal exam  SBE called in  Echo

## 2012-01-10 NOTE — Assessment & Plan Note (Signed)
Stable by duplex  F/U US 1 year

## 2012-01-10 NOTE — Assessment & Plan Note (Signed)
Discussed low carb diet.  Target hemoglobin A1c is 6.5 or less.  Continue current medications.  

## 2012-01-18 ENCOUNTER — Encounter: Payer: Self-pay | Admitting: Family Medicine

## 2012-01-18 ENCOUNTER — Ambulatory Visit (INDEPENDENT_AMBULATORY_CARE_PROVIDER_SITE_OTHER): Payer: Medicare PPO | Admitting: Family Medicine

## 2012-01-18 ENCOUNTER — Ambulatory Visit (HOSPITAL_BASED_OUTPATIENT_CLINIC_OR_DEPARTMENT_OTHER)
Admission: RE | Admit: 2012-01-18 | Discharge: 2012-01-18 | Disposition: A | Discharge status: Home / Self Care | Payer: Medicare PPO | Source: Ambulatory Visit | Attending: Family Medicine | Admitting: Family Medicine

## 2012-01-18 DIAGNOSIS — L570 Actinic keratosis: Secondary | ICD-10-CM

## 2012-01-18 DIAGNOSIS — R0602 Shortness of breath: Secondary | ICD-10-CM

## 2012-01-18 DIAGNOSIS — I517 Cardiomegaly: Secondary | ICD-10-CM | POA: Insufficient documentation

## 2012-01-18 DIAGNOSIS — E1165 Type 2 diabetes mellitus with hyperglycemia: Secondary | ICD-10-CM

## 2012-01-18 DIAGNOSIS — E118 Type 2 diabetes mellitus with unspecified complications: Secondary | ICD-10-CM

## 2012-01-18 LAB — BASIC METABOLIC PANEL
CO2: 23 mEq/L (ref 19–32)
Chloride: 97 mEq/L (ref 96–112)
Glucose, Bld: 159 mg/dL — ABNORMAL HIGH (ref 70–99)
Potassium: 4 mEq/L (ref 3.5–5.1)
Sodium: 134 mEq/L — ABNORMAL LOW (ref 135–145)

## 2012-01-18 NOTE — Assessment & Plan Note (Signed)
Unchanged.  Pt asymptomatic.  CBGs stable.  Check labs.  Adjust meds prn.

## 2012-01-18 NOTE — Progress Notes (Signed)
  Subjective:    Patient ID: Kelsey Cochran, female    DOB: 12-04-1929, 78 y.o.   MRN: [id number]  HPI DM- chronic problem, typically well controlled.  On Glyburide.  Reports fasting CBGs have been 'good'.  This AM was 88.  Denies symptomatic lows.  No CP.  Increased SOB- see below.  No HAs, visual changes, edema.  SOB- pt reports this is worsening.  Had w/u w/ Dr Eden Emms and he ruled out cardiac issues.  Has never seen pulmonary.  Previously this was thought to be multifactorial- anemia, afib, mitral valve dz, age.  Denies wheezing.  Mole- pt just noticed this but can't be certain how long it's been there.  No pain.  Not growing or changing.   Review of Systems For ROS see HPI     Objective:   Physical Exam  Constitutional: She is oriented to person, place, and time. She appears well-developed and well-nourished. No distress.  HENT:  Head: Normocephalic and atraumatic.  Eyes: Conjunctivae and EOM are normal. Pupils are equal, round, and reactive to light.  Neck: Normal range of motion. Neck supple. No thyromegaly present.  Cardiovascular: Normal rate, regular rhythm and intact distal pulses.        Mid systolic click  Pulmonary/Chest: Effort normal and breath sounds normal. No respiratory distress. She has no wheezes. She has no rales.  Abdominal: Soft. She exhibits no distension. There is no tenderness.  Musculoskeletal: She exhibits no edema.  Lymphadenopathy:    She has no cervical adenopathy.  Neurological: She is alert and oriented to person, place, and time.  Skin: Skin is warm and dry.       Small actinic keratosis on central chest  Psychiatric: She has a normal mood and affect. Her behavior is normal.          Assessment & Plan:

## 2012-01-18 NOTE — Assessment & Plan Note (Signed)
Deteriorated.  Cardiology has ruled out cardiac cause of SOB.  Will get CXR and refer to pulm for evaluation.  Pt expressed understanding and is in agreement w/ plan.

## 2012-01-18 NOTE — Patient Instructions (Signed)
Schedule your complete physical for after June 1st [facility] notify you of your lab results and make any changes if needed Go to [address] and get your chest xray We'll call you with your pulmonary appt to work up the shortness of breath Call with any questions or concerns Hang in there!!!

## 2012-01-18 NOTE — Assessment & Plan Note (Signed)
New.  No abnormal or concerning features on PE.  Watchful waiting.

## 2012-01-21 ENCOUNTER — Ambulatory Visit (HOSPITAL_COMMUNITY): Payer: Medicare PPO | Attending: Cardiovascular Disease

## 2012-01-21 ENCOUNTER — Ambulatory Visit (INDEPENDENT_AMBULATORY_CARE_PROVIDER_SITE_OTHER): Payer: Medicare PPO | Admitting: *Deleted

## 2012-01-21 ENCOUNTER — Other Ambulatory Visit: Payer: Self-pay

## 2012-01-21 DIAGNOSIS — I059 Rheumatic mitral valve disease, unspecified: Secondary | ICD-10-CM

## 2012-01-21 DIAGNOSIS — I369 Nonrheumatic tricuspid valve disorder, unspecified: Secondary | ICD-10-CM

## 2012-01-21 DIAGNOSIS — E785 Hyperlipidemia, unspecified: Secondary | ICD-10-CM | POA: Insufficient documentation

## 2012-01-21 DIAGNOSIS — R0989 Other specified symptoms and signs involving the circulatory and respiratory systems: Secondary | ICD-10-CM | POA: Insufficient documentation

## 2012-01-21 DIAGNOSIS — R0609 Other forms of dyspnea: Secondary | ICD-10-CM | POA: Insufficient documentation

## 2012-01-21 DIAGNOSIS — Z9889 Other specified postprocedural states: Secondary | ICD-10-CM

## 2012-01-21 DIAGNOSIS — I4891 Unspecified atrial fibrillation: Secondary | ICD-10-CM

## 2012-01-21 DIAGNOSIS — I10 Essential (primary) hypertension: Secondary | ICD-10-CM | POA: Insufficient documentation

## 2012-01-21 DIAGNOSIS — E119 Type 2 diabetes mellitus without complications: Secondary | ICD-10-CM | POA: Insufficient documentation

## 2012-01-26 ENCOUNTER — Other Ambulatory Visit: Payer: Self-pay | Admitting: *Deleted

## 2012-01-26 MED ORDER — GLYBURIDE 5 MG PO TABS: 5.0000 mg | ORAL_TABLET | Freq: Two times a day (BID) | ORAL | Status: DC

## 2012-01-26 NOTE — Telephone Encounter (Signed)
Rx sent 

## 2012-01-27 ENCOUNTER — Institutional Professional Consult (permissible substitution): Payer: Medicare PPO | Admitting: Critical Care Medicine

## 2012-01-27 ENCOUNTER — Telehealth: Payer: Self-pay | Admitting: Family Medicine

## 2012-01-27 MED ORDER — GLYBURIDE 5 MG PO TABS: 5.0000 mg | ORAL_TABLET | Freq: Two times a day (BID) | ORAL | Status: DC

## 2012-01-27 NOTE — Telephone Encounter (Signed)
Refill for   Glyburide 5mg  tablets (Qty 180) Take 1-tablet by mouth twice daily Last fill date 2.12.13

## 2012-01-27 NOTE — Telephone Encounter (Signed)
rx sent to pharmacy by e-script  

## 2012-01-31 ENCOUNTER — Encounter: Payer: Self-pay | Admitting: Critical Care Medicine

## 2012-01-31 ENCOUNTER — Ambulatory Visit (INDEPENDENT_AMBULATORY_CARE_PROVIDER_SITE_OTHER): Payer: Medicare PPO | Admitting: Critical Care Medicine

## 2012-01-31 VITALS — BP 150/78 | HR 68 | Temp 97.6°F | Ht 64.0 in | Wt 174.0 lb

## 2012-01-31 DIAGNOSIS — I2789 Other specified pulmonary heart diseases: Secondary | ICD-10-CM

## 2012-01-31 DIAGNOSIS — IMO0002 Reserved for concepts with insufficient information to code with codable children: Secondary | ICD-10-CM

## 2012-01-31 DIAGNOSIS — R0989 Other specified symptoms and signs involving the circulatory and respiratory systems: Secondary | ICD-10-CM

## 2012-01-31 DIAGNOSIS — I272 Pulmonary hypertension, unspecified: Secondary | ICD-10-CM | POA: Insufficient documentation

## 2012-01-31 DIAGNOSIS — R0602 Shortness of breath: Secondary | ICD-10-CM

## 2012-01-31 DIAGNOSIS — R0600 Dyspnea, unspecified: Secondary | ICD-10-CM

## 2012-01-31 NOTE — Assessment & Plan Note (Signed)
Dyspnea on basis of chronic diastolic heart failure. I doubt primary lung disease but pt needs PFTs She does have pulm HTN likely on basis of diastolic dysfunction but may need a RHC Plan FUll PFTs and 6 min walk Decide if needs Right heart cath later No med changes for now

## 2012-01-31 NOTE — Patient Instructions (Signed)
You will have pulmonary functions and 6 minute walk test at main office Return [facility] after this test or we may schedule you at [facility] with Dr Delford Field same day as breathing test

## 2012-01-31 NOTE — Assessment & Plan Note (Signed)
See dyspnea problem

## 2012-01-31 NOTE — Progress Notes (Signed)
Subjective:    Patient ID: Kelsey Cochran, female    DOB: 1930-10-05, 78 y.o.   MRN: [id number]  HPI Comments: Shortness of breath with exertion, worse over one year.   Shortness of Breath This is a chronic problem. The current episode started more than 1 year ago. The problem occurs daily (exertion only: walking down hall, had to stop twice coming to the office today). The problem has been gradually worsening. Associated symptoms include a fever, orthopnea and rhinorrhea. Pertinent negatives include no abdominal pain, chest pain, claudication, coryza, ear pain, headaches, hemoptysis, leg pain, leg swelling, neck pain, PND, rash, sore throat, sputum production, swollen glands, syncope, vomiting or wheezing. The symptoms are aggravated by any activity and exercise. Associated symptoms comments: No GERD symptoms. She has tried nothing for the symptoms. Her past medical history is significant for CAD and a heart failure. There is no history of allergies, aspirin allergies, asthma, bronchiolitis, chronic lung disease, COPD, DVT, PE, pneumonia or a recent surgery.   Past Medical History  Diagnosis Date  . S/P MVR (mitral valve replacement) 2005    [facility] 2005  [facility]  . Diastolic CHF, chronic   . Hypertension   . Pulmonary hypertension   . Arthritis   . TIA (transient ischemic attack)   . Diabetes mellitus   . Hypercholesterolemia   . TIA (transient ischemic attack)   . Anemia   . CRF (chronic renal failure)   . S/P tricuspid valve repair 2005    [facility]     Family History  Problem Relation Age of Onset  . Asthma Daughter   . Allergies Daughter   . Allergies Daughter   . Cancer Sister     bladder     History   Social History  . Marital Status: Widowed    Spouse Name: N/A    Number of Children: N/A  . Years of Education: N/A   Occupational History  . Retired     Charity fundraiser   Social History Main Topics  . Smoking status: Never Smoker   . Smokeless tobacco: Never Used  .  Alcohol Use: No  . Drug Use: No  . Sexually Active: Not on file   Other Topics Concern  . Not on file   Social History Narrative  . No narrative on file     Allergies  Allergen Reactions  . Heparin   . Meperidine Hcl   . Sulfamethoxazole W/Trimethoprim     REACTION: Sulfa Allergy     Outpatient Prescriptions Prior to Visit  Medication Sig Dispense Refill  . acetaminophen (TYLENOL) 500 MG tablet Take 500 mg by mouth. As needed       . alendronate (FOSAMAX) 35 MG tablet Take 1 tablet (35 mg total) by mouth every 7 (seven) days. Take in the morning with a full glass of water, on an empty stomach, and do not take anything else by mouth or lie down for the next 30 min.  90 tablet  0  . aspirin 81 MG tablet Take 81 mg by mouth daily.        Marland Kitchen atorvastatin (LIPITOR) 20 MG tablet Take 1 tablet (20 mg total) by mouth daily.  30 tablet  2  . carvedilol (COREG) 25 MG tablet Take 1 tablet (25 mg total) by mouth 2 (two) times daily with a meal.  180 tablet  2  . darbepoetin alfa-polysorbate (ARANESP, ALBUMIN FREE,) 60 MCG/ML injection Inject into the skin. As needed       .  Diphenhydramine-APAP, sleep, (TYLENOL PM EXTRA STRENGTH PO) Take by mouth. As needed       . docusate sodium (COLACE) 100 MG capsule Take 100 mg by mouth 2 (two) times daily.        . folic acid (FOLVITE) 800 MCG tablet Take 400 mcg by mouth daily.        . furosemide (LASIX) 80 MG tablet Take 1 tablet (80 mg total) by mouth 2 (two) times daily.  180 tablet  2  . glucose blood (TRUETEST TEST) test strip Test once daily  100 each  3  . glyBURIDE (DIABETA) 5 MG tablet Take 1 tablet (5 mg total) by mouth 2 (two) times daily.  180 tablet  0  . HYDROcodone-acetaminophen (VICODIN) 5-500 MG per tablet Take 1 tablet by mouth every 8 (eight) hours as needed for pain. 3 times a day as needed  60 tablet  1  . isosorbide mononitrate (IMDUR) 30 MG 24 hr tablet Take 1 tablet (30 mg total) by mouth daily.  90 tablet  2  . lisinopril  (PRINIVIL,ZESTRIL) 2.5 MG tablet Take 1 tablet (2.5 mg total) by mouth daily.  90 tablet  1  . methocarbamol (ROBAXIN) 500 MG tablet Take 1 tablet (500 mg total) by mouth 2 (two) times daily. As needed  180 tablet  0  . spironolactone (ALDACTONE) 25 MG tablet Take 1 tablet (25 mg total) by mouth daily.  90 tablet  4  . warfarin (COUMADIN) 2.5 MG tablet As directed by Anticoagulation Clinic  45 tablet  3       Review of Systems  Constitutional: Positive for fever. Negative for chills, diaphoresis, activity change, appetite change, fatigue and unexpected weight change.  HENT: Positive for rhinorrhea. Negative for hearing loss, ear pain, nosebleeds, congestion, sore throat, facial swelling, sneezing, mouth sores, trouble swallowing, neck pain, neck stiffness, dental problem, voice change, postnasal drip, sinus pressure, tinnitus and ear discharge.   Eyes: Negative for photophobia, discharge, itching and visual disturbance.  Respiratory: Positive for shortness of breath. Negative for apnea, cough, hemoptysis, sputum production, choking, chest tightness, wheezing and stridor.   Cardiovascular: Positive for orthopnea. Negative for chest pain, palpitations, claudication, leg swelling, syncope and PND.  Gastrointestinal: Positive for constipation. Negative for nausea, vomiting, abdominal pain, blood in stool and abdominal distention.  Genitourinary: Negative for dysuria, urgency, frequency, hematuria, flank pain, decreased urine volume and difficulty urinating.  Musculoskeletal: Positive for myalgias, arthralgias and gait problem. Negative for back pain and joint swelling.  Skin: Negative for color change, pallor and rash.  Neurological: Positive for dizziness and light-headedness. Negative for tremors, seizures, syncope, speech difficulty, weakness, numbness and headaches.  Hematological: Negative for adenopathy. Bruises/bleeds easily.  Psychiatric/Behavioral: Negative for confusion, sleep disturbance  and agitation. The patient is not nervous/anxious.        Objective:   Physical Exam  Filed Vitals:   01/31/12 1124  BP: 150/78  Pulse: 68  Temp: 97.6 F (36.4 C)  TempSrc: Oral  Height: 5\' 4"  (1.626 m)  Weight: 78.926 kg (174 lb)  SpO2: 94%    Gen: Pleasant, well-nourished, in no distress,  normal affect  ENT: No lesions,  mouth clear,  oropharynx clear, no postnasal drip  Neck: No JVD, no TMG, no carotid bruits  Lungs: No use of accessory muscles, no dullness to percussion,  Few exp wheezes  Cardiovascular: RRR, heart sounds normal, no murmur or gallops, no peripheral edema  Abdomen: soft and NT, no HSM,  BS normal  Musculoskeletal:  No deformities, no cyanosis or clubbing  Neuro: alert, non focal  Skin: Warm, no lesions or rashes  01/18/12  CXR reviewed: CM only  3/13 Echo: Study Conclusions  - Left ventricle: Systolic function was normal. The estimated ejection fraction was in the range of 60% to 65%. - Mitral valve: A mechanical prosthesis was present. Mean gradient: 3mm Hg (D). Peak gradient: 9mm Hg (D). - Pulmonary arteries: Systolic pressure was moderately increased. PA peak pressure: 56mm Hg (S).          Assessment & Plan:   DYSPNEA Dyspnea on basis of chronic diastolic heart failure. I doubt primary lung disease but pt needs PFTs She does have pulm HTN likely on basis of diastolic dysfunction but may need a RHC Plan FUll PFTs and 6 min walk Decide if needs Right heart cath later No med changes for now  Pulmonary hypertension, secondary See dyspnea problem    Updated Medication List Outpatient Encounter Prescriptions as of 01/31/2012  Medication Sig Dispense Refill  . acetaminophen (TYLENOL) 500 MG tablet Take 500 mg by mouth. As needed       . alendronate (FOSAMAX) 35 MG tablet Take 1 tablet (35 mg total) by mouth every 7 (seven) days. Take in the morning with a full glass of water, on an empty stomach, and do not take anything else by  mouth or lie down for the next 30 min.  90 tablet  0  . aspirin 81 MG tablet Take 81 mg by mouth daily.        Marland Kitchen atorvastatin (LIPITOR) 20 MG tablet Take 1 tablet (20 mg total) by mouth daily.  30 tablet  2  . carvedilol (COREG) 25 MG tablet Take 1 tablet (25 mg total) by mouth 2 (two) times daily with a meal.  180 tablet  2  . darbepoetin alfa-polysorbate (ARANESP, ALBUMIN FREE,) 60 MCG/ML injection Inject into the skin. As needed       . Diphenhydramine-APAP, sleep, (TYLENOL PM EXTRA STRENGTH PO) Take by mouth. As needed       . docusate sodium (COLACE) 100 MG capsule Take 100 mg by mouth 2 (two) times daily.        . folic acid (FOLVITE) 800 MCG tablet Take 400 mcg by mouth daily.        . furosemide (LASIX) 80 MG tablet Take 1 tablet (80 mg total) by mouth 2 (two) times daily.  180 tablet  2  . glucose blood (TRUETEST TEST) test strip Test once daily  100 each  3  . glyBURIDE (DIABETA) 5 MG tablet Take 1 tablet (5 mg total) by mouth 2 (two) times daily.  180 tablet  0  . HYDROcodone-acetaminophen (VICODIN) 5-500 MG per tablet Take 1 tablet by mouth every 8 (eight) hours as needed for pain. 3 times a day as needed  60 tablet  1  . isosorbide mononitrate (IMDUR) 30 MG 24 hr tablet Take 1 tablet (30 mg total) by mouth daily.  90 tablet  2  . lisinopril (PRINIVIL,ZESTRIL) 2.5 MG tablet Take 1 tablet (2.5 mg total) by mouth daily.  90 tablet  1  . methocarbamol (ROBAXIN) 500 MG tablet Take 1 tablet (500 mg total) by mouth 2 (two) times daily. As needed  180 tablet  0  . spironolactone (ALDACTONE) 25 MG tablet Take 1 tablet (25 mg total) by mouth daily.  90 tablet  4  . warfarin (COUMADIN) 2.5 MG tablet As directed by Anticoagulation Clinic  45 tablet  3      

## 2012-02-07 ENCOUNTER — Telehealth: Payer: Self-pay | Admitting: *Deleted

## 2012-02-07 NOTE — Telephone Encounter (Signed)
Preferred alternatives glimepiride 4 mg, glipizide 5,10 mg, glucotrol 5 mg, 10 mg

## 2012-02-07 NOTE — Telephone Encounter (Signed)
Prior Auth denied coverage because benefit provides coverage for the requested drug when medically necessary. The information submitted does not meet [facility] medical necessity guidelines for coverage.

## 2012-02-07 NOTE — Telephone Encounter (Signed)
Please check which med is covered on her formulary

## 2012-02-07 NOTE — Telephone Encounter (Signed)
Please switch to glipizide extended release 2.5 mg daily

## 2012-02-08 ENCOUNTER — Other Ambulatory Visit: Payer: Self-pay | Admitting: *Deleted

## 2012-02-08 MED ORDER — GLIPIZIDE ER 2.5 MG PO TB24: 2.5000 mg | ORAL_TABLET | Freq: Every day | ORAL | Status: DC

## 2012-02-08 NOTE — Telephone Encounter (Signed)
Irving Burton from [facility] called to ask about a fax they had sent concerning pt glyburide, advised rep that the pt medication was pending authorization, noted in chart directions from MD Tabori as to the following: Please switch to glipizide extended release 2.5 mg daily  Gave verbal order per also noted the order was faxed to pharmacy today, rep understood and will fill order for the pt.

## 2012-02-08 NOTE — Telephone Encounter (Signed)
Discuss with patient, Rx sent. 

## 2012-02-09 ENCOUNTER — Ambulatory Visit (INDEPENDENT_AMBULATORY_CARE_PROVIDER_SITE_OTHER): Payer: Medicare PPO | Admitting: Critical Care Medicine

## 2012-02-09 DIAGNOSIS — R0989 Other specified symptoms and signs involving the circulatory and respiratory systems: Secondary | ICD-10-CM

## 2012-02-09 DIAGNOSIS — R0609 Other forms of dyspnea: Secondary | ICD-10-CM

## 2012-02-09 DIAGNOSIS — R0600 Dyspnea, unspecified: Secondary | ICD-10-CM

## 2012-02-09 LAB — PULMONARY FUNCTION TEST

## 2012-02-09 NOTE — Progress Notes (Signed)
PFT done today. 

## 2012-02-10 ENCOUNTER — Ambulatory Visit (INDEPENDENT_AMBULATORY_CARE_PROVIDER_SITE_OTHER): Payer: Medicare PPO | Admitting: Critical Care Medicine

## 2012-02-10 ENCOUNTER — Encounter: Payer: Self-pay | Admitting: Critical Care Medicine

## 2012-02-10 VITALS — BP 138/78 | HR 74 | Temp 97.5°F | Ht 64.5 in | Wt 176.0 lb

## 2012-02-10 DIAGNOSIS — J441 Chronic obstructive pulmonary disease with (acute) exacerbation: Secondary | ICD-10-CM

## 2012-02-10 MED ORDER — ACLIDINIUM BROMIDE 400 MCG/ACT IN AEPB: 1.0000 | INHALATION_SPRAY | Freq: Two times a day (BID) | RESPIRATORY_TRACT | Status: DC

## 2012-02-10 NOTE — Assessment & Plan Note (Signed)
Obstructive airways disease with moderate airflow obstruction on pulmonary function testing the patient is noted to have previous passive smoke exposure Plan Begin Tudorza 1 inhalation twice daily Return 6 weeks for recheck

## 2012-02-10 NOTE — Patient Instructions (Signed)
Trial Tudorza one puff twice daily Return 1 month

## 2012-02-10 NOTE — Progress Notes (Signed)
Subjective:    Patient ID: Kelsey Cochran, female    DOB: Dec 31, 1929, 78 y.o.   MRN: [id number]  HPI Comments: Shortness of breath with exertion, worse over one year.   Shortness of Breath This is a chronic problem. The current episode started more than 1 year ago. The problem occurs daily (exertion only: walking down hall, had to stop twice coming to the office today). The problem has been gradually worsening. Associated symptoms include a fever, orthopnea and rhinorrhea. Pertinent negatives include no abdominal pain, chest pain, claudication, coryza, ear pain, headaches, hemoptysis, leg pain, leg swelling, neck pain, PND, rash, sore throat, sputum production, swollen glands, syncope, vomiting or wheezing. The symptoms are aggravated by any activity and exercise. Associated symptoms comments: No GERD symptoms. She has tried nothing for the symptoms. Her past medical history is significant for CAD and a heart failure. There is no history of allergies, aspirin allergies, asthma, bronchiolitis, chronic lung disease, COPD, DVT, PE, pneumonia or a recent surgery.   02/10/2012 Patient returns today to evaluate shortness of breath with pulmonary function testing. The patient's symptoms are as noted above and are unchanged as of yet. Past Medical History  Diagnosis Date  . S/P MVR (mitral valve replacement) 2005    [facility] 2005  [facility]  . Diastolic CHF, chronic   . Hypertension   . Pulmonary hypertension   . Arthritis   . TIA (transient ischemic attack)   . Diabetes mellitus   . Hypercholesterolemia   . TIA (transient ischemic attack)   . Anemia   . CRF (chronic renal failure)   . S/P tricuspid valve repair 2005    [facility]     Family History  Problem Relation Age of Onset  . Asthma Daughter   . Allergies Daughter   . Allergies Daughter   . Cancer Sister     bladder     History   Social History  . Marital Status: Widowed    Spouse Name: N/A    Number of Children: N/A  .  Years of Education: N/A   Occupational History  . Retired     Charity fundraiser   Social History Main Topics  . Smoking status: Former Smoker    Quit date: 02/10/1987  . Smokeless tobacco: Never Used  . Alcohol Use: No  . Drug Use: No  . Sexually Active: Not on file   Other Topics Concern  . Not on file   Social History Narrative  . No narrative on file     Allergies  Allergen Reactions  . Heparin   . Meperidine Hcl   . Sulfamethoxazole W/Trimethoprim     REACTION: Sulfa Allergy     Outpatient Prescriptions Prior to Visit  Medication Sig Dispense Refill  . acetaminophen (TYLENOL) 500 MG tablet Take 500 mg by mouth. As needed       . alendronate (FOSAMAX) 35 MG tablet Take 1 tablet (35 mg total) by mouth every 7 (seven) days. Take in the morning with a full glass of water, on an empty stomach, and do not take anything else by mouth or lie down for the next 30 min.  90 tablet  0  . aspirin 81 MG tablet Take 81 mg by mouth daily.        Marland Kitchen atorvastatin (LIPITOR) 20 MG tablet Take 1 tablet (20 mg total) by mouth daily.  30 tablet  2  . carvedilol (COREG) 25 MG tablet Take 1 tablet (25 mg total) by mouth  2 (two) times daily with a meal.  180 tablet  2  . Diphenhydramine-APAP, sleep, (TYLENOL PM EXTRA STRENGTH PO) Take by mouth. As needed       . docusate sodium (COLACE) 100 MG capsule Take 100 mg by mouth 2 (two) times daily.        . folic acid (FOLVITE) 800 MCG tablet Take 400 mcg by mouth daily.        . furosemide (LASIX) 80 MG tablet Take 1 tablet (80 mg total) by mouth 2 (two) times daily.  180 tablet  2  . glipiZIDE (GLUCOTROL XL) 2.5 MG 24 hr tablet Take 1 tablet (2.5 mg total) by mouth daily.  30 tablet  2  . glucose blood (TRUETEST TEST) test strip Test once daily  100 each  3  . HYDROcodone-acetaminophen (VICODIN) 5-500 MG per tablet Take 1 tablet by mouth every 8 (eight) hours as needed for pain. 3 times a day as needed  60 tablet  1  . isosorbide mononitrate (IMDUR) 30 MG 24 hr  tablet Take 1 tablet (30 mg total) by mouth daily.  90 tablet  2  . lisinopril (PRINIVIL,ZESTRIL) 2.5 MG tablet Take 1 tablet (2.5 mg total) by mouth daily.  90 tablet  1  . methocarbamol (ROBAXIN) 500 MG tablet Take 1 tablet (500 mg total) by mouth 2 (two) times daily. As needed  180 tablet  0  . spironolactone (ALDACTONE) 25 MG tablet Take 1 tablet (25 mg total) by mouth daily.  90 tablet  4  . warfarin (COUMADIN) 2.5 MG tablet As directed by Anticoagulation Clinic  45 tablet  3  . darbepoetin alfa-polysorbate (ARANESP, ALBUMIN FREE,) 60 MCG/ML injection Inject into the skin. As needed            Review of Systems  Constitutional: Positive for fever. Negative for chills, diaphoresis, activity change, appetite change, fatigue and unexpected weight change.  HENT: Positive for rhinorrhea. Negative for hearing loss, ear pain, nosebleeds, congestion, sore throat, facial swelling, sneezing, mouth sores, trouble swallowing, neck pain, neck stiffness, dental problem, voice change, postnasal drip, sinus pressure, tinnitus and ear discharge.   Eyes: Negative for photophobia, discharge, itching and visual disturbance.  Respiratory: Positive for shortness of breath. Negative for apnea, cough, hemoptysis, sputum production, choking, chest tightness, wheezing and stridor.   Cardiovascular: Positive for orthopnea. Negative for chest pain, palpitations, claudication, leg swelling, syncope and PND.  Gastrointestinal: Positive for constipation. Negative for nausea, vomiting, abdominal pain, blood in stool and abdominal distention.  Genitourinary: Negative for dysuria, urgency, frequency, hematuria, flank pain, decreased urine volume and difficulty urinating.  Musculoskeletal: Positive for myalgias, arthralgias and gait problem. Negative for back pain and joint swelling.  Skin: Negative for color change, pallor and rash.  Neurological: Positive for dizziness and light-headedness. Negative for tremors, seizures,  syncope, speech difficulty, weakness, numbness and headaches.  Hematological: Negative for adenopathy. Bruises/bleeds easily.  Psychiatric/Behavioral: Negative for confusion, sleep disturbance and agitation. The patient is not nervous/anxious.        Objective:   Physical Exam   Filed Vitals:   02/10/12 1443  BP: 138/78  Pulse: 74  Temp: 97.5 F (36.4 C)  TempSrc: Oral  Height: 5' 4.5" (1.638 m)  Weight: 176 lb (79.833 kg)  SpO2: 96%    Gen: Pleasant, well-nourished, in no distress,  normal affect  ENT: No lesions,  mouth clear,  oropharynx clear, no postnasal drip  Neck: No JVD, no TMG, no carotid bruits  Lungs: No use  of accessory muscles, no dullness to percussion,  Few exp wheezes  Cardiovascular: RRR, heart sounds normal, no murmur or gallops, no peripheral edema  Abdomen: soft and NT, no HSM,  BS normal  Musculoskeletal: No deformities, no cyanosis or clubbing  Neuro: alert, non focal  Skin: Warm, no lesions or rashes  01/18/12  CXR reviewed: CM only  3/13 Echo: Study Conclusions  - Left ventricle: Systolic function was normal. The estimated ejection fraction was in the range of 60% to 65%. - Mitral valve: A mechanical prosthesis was present. Mean gradient: 3mm Hg (D). Peak gradient: 9mm Hg (D). - Pulmonary arteries: Systolic pressure was moderately increased. PA peak pressure: 56mm Hg (S).  02/09/12 PFT FEV1 73% FeV1/FVC 69%  total lung capacity 79% FEF 25-75  38% DLCO 57%        Assessment & Plan:   Obstructive chronic bronchitis with exacerbation Obstructive airways disease with moderate airflow obstruction on pulmonary function testing the patient is noted to have previous passive smoke exposure Plan Begin Tudorza 1 inhalation twice daily Return 6 weeks for recheck     Updated Medication List Outpatient Encounter Prescriptions as of 02/10/2012  Medication Sig Dispense Refill  . acetaminophen (TYLENOL) 500 MG tablet Take 500 mg by mouth.  As needed       . alendronate (FOSAMAX) 35 MG tablet Take 1 tablet (35 mg total) by mouth every 7 (seven) days. Take in the morning with a full glass of water, on an empty stomach, and do not take anything else by mouth or lie down for the next 30 min.  90 tablet  0  . aspirin 81 MG tablet Take 81 mg by mouth daily.        Marland Kitchen atorvastatin (LIPITOR) 20 MG tablet Take 1 tablet (20 mg total) by mouth daily.  30 tablet  2  . carvedilol (COREG) 25 MG tablet Take 1 tablet (25 mg total) by mouth 2 (two) times daily with a meal.  180 tablet  2  . Diphenhydramine-APAP, sleep, (TYLENOL PM EXTRA STRENGTH PO) Take by mouth. As needed       . docusate sodium (COLACE) 100 MG capsule Take 100 mg by mouth 2 (two) times daily.        . folic acid (FOLVITE) 800 MCG tablet Take 400 mcg by mouth daily.        . furosemide (LASIX) 80 MG tablet Take 1 tablet (80 mg total) by mouth 2 (two) times daily.  180 tablet  2  . glipiZIDE (GLUCOTROL XL) 2.5 MG 24 hr tablet Take 1 tablet (2.5 mg total) by mouth daily.  30 tablet  2  . glucose blood (TRUETEST TEST) test strip Test once daily  100 each  3  . HYDROcodone-acetaminophen (VICODIN) 5-500 MG per tablet Take 1 tablet by mouth every 8 (eight) hours as needed for pain. 3 times a day as needed  60 tablet  1  . isosorbide mononitrate (IMDUR) 30 MG 24 hr tablet Take 1 tablet (30 mg total) by mouth daily.  90 tablet  2  . lisinopril (PRINIVIL,ZESTRIL) 2.5 MG tablet Take 1 tablet (2.5 mg total) by mouth daily.  90 tablet  1  . methocarbamol (ROBAXIN) 500 MG tablet Take 1 tablet (500 mg total) by mouth 2 (two) times daily. As needed  180 tablet  0  . spironolactone (ALDACTONE) 25 MG tablet Take 1 tablet (25 mg total) by mouth daily.  90 tablet  4  . warfarin (COUMADIN) 2.5 MG  tablet As directed by Anticoagulation Clinic  45 tablet  3  . Aclidinium Bromide (TUDORZA PRESSAIR) 400 MCG/ACT AEPB Inhale 1 puff into the lungs 2 (two) times daily.  1 each  1  . DISCONTD: darbepoetin  alfa-polysorbate (ARANESP, ALBUMIN FREE,) 60 MCG/ML injection Inject into the skin. As needed

## 2012-02-14 ENCOUNTER — Encounter: Payer: Self-pay | Admitting: Critical Care Medicine

## 2012-02-22 ENCOUNTER — Other Ambulatory Visit: Payer: Self-pay | Admitting: *Deleted

## 2012-02-22 MED ORDER — ATORVASTATIN CALCIUM 20 MG PO TABS: 20.0000 mg | ORAL_TABLET | Freq: Every day | ORAL | Status: DC

## 2012-02-22 NOTE — Telephone Encounter (Signed)
Noted refill request fax from [facility] on [address], for Atrovastatin 20mg  #30, rx sent to pharmacy by e-script

## 2012-02-29 ENCOUNTER — Ambulatory Visit (INDEPENDENT_AMBULATORY_CARE_PROVIDER_SITE_OTHER): Payer: Medicare PPO | Admitting: Pharmacist

## 2012-02-29 DIAGNOSIS — Z9889 Other specified postprocedural states: Secondary | ICD-10-CM

## 2012-02-29 DIAGNOSIS — I4891 Unspecified atrial fibrillation: Secondary | ICD-10-CM

## 2012-02-29 DIAGNOSIS — I059 Rheumatic mitral valve disease, unspecified: Secondary | ICD-10-CM

## 2012-03-01 ENCOUNTER — Other Ambulatory Visit: Payer: Self-pay | Admitting: Family Medicine

## 2012-03-01 NOTE — Telephone Encounter (Signed)
Refill for Hydrocoodone Acetaminophen 5-500 Qty 60 Take 1-tablet by mouth every 6-hours as needed for pain

## 2012-03-02 NOTE — Telephone Encounter (Signed)
Last OV 01-18-12 last refill 10-20-11 #60 with 1 refill

## 2012-03-02 NOTE — Telephone Encounter (Signed)
Ok for #60 w/ 1 refill

## 2012-03-03 MED ORDER — HYDROCODONE-ACETAMINOPHEN 5-500 MG PO TABS: 1.0000 | ORAL_TABLET | Freq: Three times a day (TID) | ORAL | Status: DC | PRN

## 2012-03-03 NOTE — Telephone Encounter (Signed)
.  rx faxed to pharmacy, manually.  

## 2012-03-06 ENCOUNTER — Encounter: Payer: Self-pay | Admitting: Critical Care Medicine

## 2012-03-06 ENCOUNTER — Ambulatory Visit (INDEPENDENT_AMBULATORY_CARE_PROVIDER_SITE_OTHER): Payer: Medicare PPO | Admitting: Critical Care Medicine

## 2012-03-06 VITALS — BP 124/68 | HR 72 | Temp 97.6°F | Ht 64.0 in | Wt 171.0 lb

## 2012-03-06 DIAGNOSIS — J441 Chronic obstructive pulmonary disease with (acute) exacerbation: Secondary | ICD-10-CM

## 2012-03-06 NOTE — Patient Instructions (Signed)
Stay on Kelsey Cochran [facility] Return 6 weeks

## 2012-03-06 NOTE — Progress Notes (Signed)
Subjective:    Patient ID: Kelsey Cochran, female    DOB: 1929/12/10, 78 y.o.   MRN: [id number]  HPI 3/18 Patient returns today to evaluate shortness of breath with pulmonary function testing. The patient's symptoms are as noted above and are unchanged as of yet.  03/06/2012 Has not taken full month on Tudorza yet d/t dental work May be slightly better with dyspnea. Pt denies any significant sore throat, nasal congestion or excess secretions, fever, chills, sweats, unintended weight loss, pleurtic or exertional chest pain, orthopnea PND, or leg swelling Pt denies any increase in rescue therapy over baseline, denies waking up needing it or having any early am or nocturnal exacerbations of coughing/wheezing/or dyspnea. Pt also denies any obvious fluctuation in symptoms with  weather or environmental change or other alleviating or aggravating factors    Past Medical History  Diagnosis Date  . S/P MVR (mitral valve replacement) 2005    [facility] 2005  [facility]  . Diastolic CHF, chronic   . Hypertension   . Pulmonary hypertension   . Arthritis   . TIA (transient ischemic attack)   . Diabetes mellitus   . Hypercholesterolemia   . TIA (transient ischemic attack)   . Anemia   . CRF (chronic renal failure)   . S/P tricuspid valve repair 2005    [facility]     Family History  Problem Relation Age of Onset  . Asthma Daughter   . Allergies Daughter   . Allergies Daughter   . Cancer Sister     bladder     History   Social History  . Marital Status: Widowed    Spouse Name: N/A    Number of Children: N/A  . Years of Education: N/A   Occupational History  . Retired     Charity fundraiser   Social History Main Topics  . Smoking status: Former Smoker    Quit date: 02/10/1987  . Smokeless tobacco: Never Used  . Alcohol Use: No  . Drug Use: No  . Sexually Active: Not on file   Other Topics Concern  . Not on file   Social History Narrative  . No narrative on file     Allergies    Allergen Reactions  . Heparin   . Meperidine Hcl   . Sulfamethoxazole W/Trimethoprim     REACTION: Sulfa Allergy     Outpatient Prescriptions Prior to Visit  Medication Sig Dispense Refill  . acetaminophen (TYLENOL) 500 MG tablet Take 500 mg by mouth. As needed       . Aclidinium Bromide (TUDORZA PRESSAIR) 400 MCG/ACT AEPB Inhale 1 puff into the lungs 2 (two) times daily.  1 each  1  . alendronate (FOSAMAX) 35 MG tablet Take 1 tablet (35 mg total) by mouth every 7 (seven) days. Take in the morning with a full glass of water, on an empty stomach, and do not take anything else by mouth or lie down for the next 30 min.  90 tablet  0  . aspirin 81 MG tablet Take 81 mg by mouth daily.        Marland Kitchen atorvastatin (LIPITOR) 20 MG tablet Take 1 tablet (20 mg total) by mouth daily.  30 tablet  2  . carvedilol (COREG) 25 MG tablet Take 1 tablet (25 mg total) by mouth 2 (two) times daily with a meal.  180 tablet  2  . Diphenhydramine-APAP, sleep, (TYLENOL PM EXTRA STRENGTH PO) Take by mouth. As needed       .  docusate sodium (COLACE) 100 MG capsule Take 100 mg by mouth 2 (two) times daily.        . folic acid (FOLVITE) 800 MCG tablet Take 400 mcg by mouth daily.        . furosemide (LASIX) 80 MG tablet Take 1 tablet (80 mg total) by mouth 2 (two) times daily.  180 tablet  2  . glipiZIDE (GLUCOTROL XL) 2.5 MG 24 hr tablet Take 1 tablet (2.5 mg total) by mouth daily.  30 tablet  2  . glucose blood (TRUETEST TEST) test strip Test once daily  100 each  3  . HYDROcodone-acetaminophen (VICODIN) 5-500 MG per tablet Take 1 tablet by mouth every 8 (eight) hours as needed for pain. 3 times a day as needed  60 tablet  1  . isosorbide mononitrate (IMDUR) 30 MG 24 hr tablet Take 1 tablet (30 mg total) by mouth daily.  90 tablet  2  . lisinopril (PRINIVIL,ZESTRIL) 2.5 MG tablet Take 1 tablet (2.5 mg total) by mouth daily.  90 tablet  1  . methocarbamol (ROBAXIN) 500 MG tablet Take 1 tablet (500 mg total) by mouth 2 (two)  times daily. As needed  180 tablet  0  . spironolactone (ALDACTONE) 25 MG tablet Take 1 tablet (25 mg total) by mouth daily.  90 tablet  4  . warfarin (COUMADIN) 2.5 MG tablet As directed by Anticoagulation Clinic  45 tablet  3       Review of Systems  Constitutional: Negative for chills, diaphoresis, activity change, appetite change, fatigue and unexpected weight change.  HENT: Negative for hearing loss, nosebleeds, congestion, facial swelling, sneezing, mouth sores, trouble swallowing, neck stiffness, dental problem, voice change, postnasal drip, sinus pressure, tinnitus and ear discharge.   Eyes: Negative for photophobia, discharge, itching and visual disturbance.  Respiratory: Negative for apnea, cough, choking, chest tightness and stridor.   Cardiovascular: Negative for palpitations.  Gastrointestinal: Positive for constipation. Negative for nausea, blood in stool and abdominal distention.  Genitourinary: Negative for dysuria, urgency, frequency, hematuria, flank pain, decreased urine volume and difficulty urinating.  Musculoskeletal: Positive for myalgias, arthralgias and gait problem. Negative for back pain and joint swelling.  Skin: Negative for color change and pallor.  Neurological: Positive for dizziness and light-headedness. Negative for tremors, seizures, syncope, speech difficulty, weakness and numbness.  Hematological: Negative for adenopathy. Bruises/bleeds easily.  Psychiatric/Behavioral: Negative for confusion, sleep disturbance and agitation. The patient is not nervous/anxious.        Objective:   Physical Exam   Filed Vitals:   03/06/12 1032  BP: 124/68  Pulse: 72  Temp: 97.6 F (36.4 C)  TempSrc: Oral  Height: 5\' 4"  (1.626 m)  Weight: 77.565 kg (171 lb)  SpO2: 98%    Gen: Pleasant, well-nourished, in no distress,  normal affect  ENT: No lesions,  mouth clear,  oropharynx clear, no postnasal drip  Neck: No JVD, no TMG, no carotid bruits  Lungs: No use  of accessory muscles, no dullness to percussion,  Few exp wheezes  Cardiovascular: RRR, heart sounds normal, no murmur or gallops, no peripheral edema  Abdomen: soft and NT, no HSM,  BS normal  Musculoskeletal: No deformities, no cyanosis or clubbing  Neuro: alert, non focal  Skin: Warm, no lesions or rashes  01/18/12  CXR reviewed: CM only  3/13 Echo: Study Conclusions  - Left ventricle: Systolic function was normal. The estimated ejection fraction was in the range of 60% to 65%. - Mitral valve: A mechanical  prosthesis was present. Mean gradient: 3mm Hg (D). Peak gradient: 9mm Hg (D). - Pulmonary arteries: Systolic pressure was moderately increased. PA peak pressure: 56mm Hg (S).  02/09/12 PFT FEV1 73% FeV1/FVC 69%  total lung capacity 79% FEF 25-75  38% DLCO 57%        Assessment & Plan:   Obstructive chronic bronchitis with exacerbation Obstructed  airways disease with pulmonary function testing on 02/09/2012 revealing: FEV1 73% FeV1/FVC 69%  total lung capacity 79% FEF 25-75  38% DLCO 57%  Pt has not have sufficient Tudorza dosing to determine efficacy Plan Stay on tudorza  Return 6 weeks     Updated Medication List Outpatient Encounter Prescriptions as of 03/06/2012  Medication Sig Dispense Refill  . acetaminophen (TYLENOL) 500 MG tablet Take 500 mg by mouth. As needed       . Aclidinium Bromide (TUDORZA PRESSAIR) 400 MCG/ACT AEPB Inhale 1 puff into the lungs 2 (two) times daily.  1 each  1  . alendronate (FOSAMAX) 35 MG tablet Take 1 tablet (35 mg total) by mouth every 7 (seven) days. Take in the morning with a full glass of water, on an empty stomach, and do not take anything else by mouth or lie down for the next 30 min.  90 tablet  0  . aspirin 81 MG tablet Take 81 mg by mouth daily.        Marland Kitchen atorvastatin (LIPITOR) 20 MG tablet Take 1 tablet (20 mg total) by mouth daily.  30 tablet  2  . carvedilol (COREG) 25 MG tablet Take 1 tablet (25 mg total) by mouth 2  (two) times daily with a meal.  180 tablet  2  . Diphenhydramine-APAP, sleep, (TYLENOL PM EXTRA STRENGTH PO) Take by mouth. As needed       . docusate sodium (COLACE) 100 MG capsule Take 100 mg by mouth 2 (two) times daily.        . folic acid (FOLVITE) 800 MCG tablet Take 400 mcg by mouth daily.        . furosemide (LASIX) 80 MG tablet Take 1 tablet (80 mg total) by mouth 2 (two) times daily.  180 tablet  2  . glipiZIDE (GLUCOTROL XL) 2.5 MG 24 hr tablet Take 1 tablet (2.5 mg total) by mouth daily.  30 tablet  2  . glucose blood (TRUETEST TEST) test strip Test once daily  100 each  3  . HYDROcodone-acetaminophen (VICODIN) 5-500 MG per tablet Take 1 tablet by mouth every 8 (eight) hours as needed for pain. 3 times a day as needed  60 tablet  1  . isosorbide mononitrate (IMDUR) 30 MG 24 hr tablet Take 1 tablet (30 mg total) by mouth daily.  90 tablet  2  . lisinopril (PRINIVIL,ZESTRIL) 2.5 MG tablet Take 1 tablet (2.5 mg total) by mouth daily.  90 tablet  1  . methocarbamol (ROBAXIN) 500 MG tablet Take 1 tablet (500 mg total) by mouth 2 (two) times daily. As needed  180 tablet  0  . spironolactone (ALDACTONE) 25 MG tablet Take 1 tablet (25 mg total) by mouth daily.  90 tablet  4  . warfarin (COUMADIN) 2.5 MG tablet As directed by Anticoagulation Clinic  45 tablet  3

## 2012-03-06 NOTE — Assessment & Plan Note (Signed)
Obstructed  airways disease with pulmonary function testing on 02/09/2012 revealing: FEV1 73% FeV1/FVC 69%  total lung capacity 79% FEF 25-75  38% DLCO 57%  Pt has not have sufficient Tudorza dosing to determine efficacy Plan Stay on tudorza  Return 6 weeks

## 2012-03-08 ENCOUNTER — Telehealth: Payer: Self-pay

## 2012-03-08 NOTE — Telephone Encounter (Signed)
PA-Methocarbamol 500 mg Tablet 180/90 approved until 11/14/2012. Letter sent to be scanned     KP

## 2012-03-14 ENCOUNTER — Ambulatory Visit (INDEPENDENT_AMBULATORY_CARE_PROVIDER_SITE_OTHER): Payer: Medicare PPO | Admitting: *Deleted

## 2012-03-14 DIAGNOSIS — I059 Rheumatic mitral valve disease, unspecified: Secondary | ICD-10-CM

## 2012-03-14 DIAGNOSIS — Z9889 Other specified postprocedural states: Secondary | ICD-10-CM

## 2012-03-14 DIAGNOSIS — I4891 Unspecified atrial fibrillation: Secondary | ICD-10-CM

## 2012-03-21 ENCOUNTER — Other Ambulatory Visit: Payer: Self-pay | Admitting: Family Medicine

## 2012-03-21 ENCOUNTER — Other Ambulatory Visit: Payer: Self-pay | Admitting: *Deleted

## 2012-03-21 MED ORDER — ALENDRONATE SODIUM 35 MG PO TABS: 35.0000 mg | ORAL_TABLET | ORAL | Status: DC

## 2012-03-21 NOTE — Telephone Encounter (Signed)
rx sent to pharmacy by e-script  

## 2012-03-21 NOTE — Telephone Encounter (Signed)
Refill aldendronate tab 35mg  Qty90 Take one tablet every 7-days as directed. See package for additional instructions  Last filled for 90 on 4.24.13 Last ov 3.5.13

## 2012-03-21 NOTE — Telephone Encounter (Signed)
Faxed the fosamax rx to [facility] per noted that RX printed for some reason and should not have

## 2012-03-27 ENCOUNTER — Other Ambulatory Visit (HOSPITAL_BASED_OUTPATIENT_CLINIC_OR_DEPARTMENT_OTHER): Payer: Medicare PPO | Admitting: Lab

## 2012-03-27 ENCOUNTER — Ambulatory Visit (HOSPITAL_BASED_OUTPATIENT_CLINIC_OR_DEPARTMENT_OTHER): Payer: Medicare PPO | Admitting: Hematology & Oncology

## 2012-03-27 VITALS — BP 117/71 | HR 74 | Temp 97.1°F | Ht 64.0 in | Wt 169.0 lb

## 2012-03-27 DIAGNOSIS — Z7901 Long term (current) use of anticoagulants: Secondary | ICD-10-CM

## 2012-03-27 DIAGNOSIS — D649 Anemia, unspecified: Secondary | ICD-10-CM

## 2012-03-27 DIAGNOSIS — D594 Other nonautoimmune hemolytic anemias: Secondary | ICD-10-CM

## 2012-03-27 DIAGNOSIS — Z952 Presence of prosthetic heart valve: Secondary | ICD-10-CM

## 2012-03-27 DIAGNOSIS — Z954 Presence of other heart-valve replacement: Secondary | ICD-10-CM

## 2012-03-27 LAB — RETICULOCYTES (CHCC): ABS Retic: 86.7 10*3/uL (ref 19.0–186.0)

## 2012-03-27 LAB — CBC WITH DIFFERENTIAL (CANCER CENTER ONLY)
BASO%: 0.5 % (ref 0.0–2.0)
EOS%: 3.3 % (ref 0.0–7.0)
LYMPH%: 12.5 % — ABNORMAL LOW (ref 14.0–48.0)
MCH: 32.3 pg (ref 26.0–34.0)
MCV: 97 fL (ref 81–101)
MONO%: 5.1 % (ref 0.0–13.0)
NEUT#: 6.2 10*3/uL (ref 1.5–6.5)
Platelets: 124 10*3/uL — ABNORMAL LOW (ref 145–400)
RDW: 14 % (ref 11.1–15.7)

## 2012-03-27 LAB — FERRITIN: Ferritin: 1728 ng/mL — ABNORMAL HIGH (ref 10–291)

## 2012-03-27 NOTE — Progress Notes (Signed)
This office note has been dictated.

## 2012-03-28 ENCOUNTER — Ambulatory Visit (INDEPENDENT_AMBULATORY_CARE_PROVIDER_SITE_OTHER): Payer: Medicare PPO | Admitting: *Deleted

## 2012-03-28 DIAGNOSIS — Z9889 Other specified postprocedural states: Secondary | ICD-10-CM

## 2012-03-28 DIAGNOSIS — I4891 Unspecified atrial fibrillation: Secondary | ICD-10-CM

## 2012-03-28 DIAGNOSIS — I059 Rheumatic mitral valve disease, unspecified: Secondary | ICD-10-CM

## 2012-03-28 LAB — POCT INR: INR: 3.3

## 2012-03-28 NOTE — Progress Notes (Signed)
CC:   Kelsey Cochran. Kelsey Emms, MD, [facility] Kelsey Cochran, M.D.  DIAGNOSES: 1. 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 2. Intermittent iron deficiency anemia from bleeding.  CURRENT THERAPY: 1. Folic acid 1 mg p.o. daily. 2. IV iron as indicated. 3. Coumadin, lifelong.  INTERIM HISTORY:  Kelsey Cochran comes in for followup. I last saw her in January.  Since then, she has been doing quite well.  She does have some ecchymoses, which has been chronic.  The patient has not had not had any obvious bleeding.  She is doing well with the Coumadin.  She said that the last pro time showed her blood to be "a little thin."  When we last checked her iron studies back in January, her ferritin was 1628.  A lot of this is an acute phase reactant.  Her percent saturation was 28%.  Her last BUN and creatinine were 16 and 1.1 in March.  She has had no fever, sweats or chills.  There has been no nausea or vomiting.  She did have a chest x-ray in March.  This did show some cardiomegaly.  PHYSICAL EXAMINATION:  This is a elderly but well-nourished white female in no obvious distress.  Vital signs:  97.1, pulse 74, respiratory rate 18, blood pressure 117/71.  Weight is 169.  Head and neck: Normocephalic, atraumatic skull.  There are no ocular or oral lesions. There are no palpable cervical or supraclavicular lymph nodes.  Lungs: Clear bilaterally.  Cardiac:  Regular rate and rhythm with normal S1, S2.  She has a systolic click.  Abdomen:  Soft with good bowel sounds. There is no palpable abdominal mass.  There is no fluid wave.  There is no palpable hepatosplenomegaly. Back:  No tenderness over the spine, ribs, or hips.  Extremities:  No clubbing, cyanosis or edema.  She has good range motion of her joints.  Neurological:  No focal neurological deficits.  LABORATORY STUDIES:  White cell count is 7.8, hemoglobin 12.2, hematocrit 36.8, platelet count 124.  MCV is 97.  IMPRESSION:  Ms.  Cochran is a 78 year old white female with a prostatic aortic valve.  She is on lifelong Coumadin.  She has done well with this.  She has had a couple episodes of bleeding with the Coumadin.  Fortunately, we have not had to give her iron now for probably a couple of years so this is a blessing.  Will go ahead and plan to get her back after Labor Day.  I just think that she is doing very well and that we do not need to have her come back sooner, or do any blood work.    ______________________________ Josph Macho, M.D. PRE/MEDQ  D:  03/27/2012  T:  03/28/2012  Job:  [id number]

## 2012-03-31 ENCOUNTER — Encounter: Payer: Self-pay | Admitting: Family Medicine

## 2012-04-18 ENCOUNTER — Ambulatory Visit (INDEPENDENT_AMBULATORY_CARE_PROVIDER_SITE_OTHER): Payer: Medicare PPO

## 2012-04-18 DIAGNOSIS — I059 Rheumatic mitral valve disease, unspecified: Secondary | ICD-10-CM

## 2012-04-18 DIAGNOSIS — I4891 Unspecified atrial fibrillation: Secondary | ICD-10-CM

## 2012-04-18 DIAGNOSIS — Z9889 Other specified postprocedural states: Secondary | ICD-10-CM

## 2012-04-18 LAB — POCT INR: INR: 3.2

## 2012-04-20 ENCOUNTER — Encounter: Payer: Self-pay | Admitting: Family Medicine

## 2012-04-20 ENCOUNTER — Ambulatory Visit (INDEPENDENT_AMBULATORY_CARE_PROVIDER_SITE_OTHER): Payer: Medicare PPO | Admitting: Family Medicine

## 2012-04-20 VITALS — BP 116/72 | HR 98 | Temp 98.5°F | Ht 64.0 in | Wt 160.2 lb

## 2012-04-20 DIAGNOSIS — E1165 Type 2 diabetes mellitus with hyperglycemia: Secondary | ICD-10-CM

## 2012-04-20 DIAGNOSIS — Z0000 Encounter for general adult medical examination without abnormal findings: Secondary | ICD-10-CM

## 2012-04-20 DIAGNOSIS — E559 Vitamin D deficiency, unspecified: Secondary | ICD-10-CM

## 2012-04-20 DIAGNOSIS — E7800 Pure hypercholesterolemia, unspecified: Secondary | ICD-10-CM

## 2012-04-20 DIAGNOSIS — E118 Type 2 diabetes mellitus with unspecified complications: Secondary | ICD-10-CM

## 2012-04-20 DIAGNOSIS — I10 Essential (primary) hypertension: Secondary | ICD-10-CM

## 2012-04-20 LAB — LIPID PANEL
Cholesterol: 187 mg/dL (ref 0–200)
Total CHOL/HDL Ratio: 4
Triglycerides: 226 mg/dL — ABNORMAL HIGH (ref 0.0–149.0)
VLDL: 45.2 mg/dL — ABNORMAL HIGH (ref 0.0–40.0)

## 2012-04-20 LAB — CBC WITH DIFFERENTIAL/PLATELET
Basophils Absolute: 0.1 10*3/uL (ref 0.0–0.1)
Eosinophils Absolute: 0.3 10*3/uL (ref 0.0–0.7)
HCT: 40.9 % (ref 36.0–46.0)
Lymphs Abs: 1.2 10*3/uL (ref 0.7–4.0)
MCHC: 33.2 g/dL (ref 30.0–36.0)
MCV: 96.7 fl (ref 78.0–100.0)
Monocytes Absolute: 0.5 10*3/uL (ref 0.1–1.0)
Platelets: 163 10*3/uL (ref 150.0–400.0)
RDW: 15 % — ABNORMAL HIGH (ref 11.5–14.6)

## 2012-04-20 LAB — HEPATIC FUNCTION PANEL
Bilirubin, Direct: 0.1 mg/dL (ref 0.0–0.3)
Total Bilirubin: 1.1 mg/dL (ref 0.3–1.2)

## 2012-04-20 LAB — BASIC METABOLIC PANEL
Calcium: 9.3 mg/dL (ref 8.4–10.5)
Creatinine, Ser: 1 mg/dL (ref 0.4–1.2)

## 2012-04-20 LAB — TSH: TSH: 1.13 u[IU]/mL (ref 0.35–5.50)

## 2012-04-20 LAB — HEMOGLOBIN A1C: Hgb A1c MFr Bld: 7.3 % — ABNORMAL HIGH (ref 4.6–6.5)

## 2012-04-20 LAB — LDL CHOLESTEROL, DIRECT: Direct LDL: 119.9 mg/dL

## 2012-04-20 NOTE — Assessment & Plan Note (Signed)
Chronic problem, adequate control.  No changes at this time.

## 2012-04-20 NOTE — Progress Notes (Signed)
  Subjective:    Patient ID: Kelsey Cochran, female    DOB: August 11, 1930, 78 y.o.   MRN: [id number]  HPI Here today for CPE.  Risk Factors: HTN- chronic problem, excellent control today.  On Coreg (holds for SBP<120), lasix, Imdur, Lisinopril, spironolactone.  Denies CP, SOB is at baseline, HAs, visual changes, edema. DM- chronic problem, on glipizide after insurance stopped covering Glyburide.  avg CBGs 143 since starting new med on April 2.  Denies symptomatic lows.  UTD on eye exam (4/13). Hyperlipidemia- chronic problem, on Lipitor.  Denies abd pain, N/V, myalgias. Physical Activity: very limited due to SOB Fall Risk: moderate risk due to cane use Depression: no current sxs Hearing: normal to conversational tones, decreased at whispered voice at 6 ft ADL's: independent Cognitive: normal linear thought process, memory/attention intact. Home Safety: feels safe at home, lives alone Height, Weight, BMI, Visual Acuity: see vitals, vision corrected to 20/20 w/ glasses Counseling: not having colonoscopy- 'i won't'.  UTD on mammo.  No need for pap.  UTD on DEXA Labs Ordered: See A&P Care Plan: See A&P    Review of Systems Patient reports no vision/ hearing changes, adenopathy,fever, weight change,  persistant/recurrent hoarseness , swallowing issues, chest pain, palpitations, edema, persistant/recurrent cough, hemoptysis, dyspnea (rest/exertional/paroxysmal nocturnal), gastrointestinal bleeding (melena, rectal bleeding), abdominal pain, significant heartburn, bowel changes, GU symptoms (dysuria, hematuria, incontinence), Gyn symptoms (abnormal  bleeding, pain),  syncope, focal weakness, memory loss, numbness & tingling, skin/hair/nail changes, abnormal bruising or bleeding, anxiety, or depression.     Objective:   Physical Exam General Appearance:    Alert, cooperative, no distress, appears stated age  Head:    Normocephalic, without obvious abnormality, atraumatic  Eyes:    PERRL,  conjunctiva/corneas clear, EOM's intact, fundi    benign, both eyes  Ears:    Normal TM's and external ear canals, both ears  Nose:   Nares normal, septum midline, mucosa normal, no drainage    or sinus tenderness  Throat:   Lips, mucosa, and tongue normal; teeth and gums normal  Neck:   Supple, symmetrical, trachea midline, no adenopathy;    Thyroid: no enlargement/tenderness/nodules  Back:     Symmetric, no curvature, ROM normal, no CVA tenderness  Lungs:     Clear to auscultation bilaterally, respirations unlabored  Chest Wall:    No tenderness or deformity   Heart:    Irregular S1 and S2, + murmur  Breast Exam:    Deferred  Abdomen:     Soft, non-tender, bowel sounds active all four quadrants,    no masses, no organomegaly  Genitalia:    Deferred  Rectal:    Extremities:   Extremities normal, atraumatic, no cyanosis or edema  Pulses:   2+ and symmetric all extremities  Skin:   Skin color, texture, turgor normal, no rashes or lesions  Lymph nodes:   Cervical, supraclavicular, and axillary nodes normal  Neurologic:   CNII-XII intact, normal strength, sensation and reflexes    throughout          Assessment & Plan:

## 2012-04-20 NOTE — Assessment & Plan Note (Signed)
Chronic problem.  Typically well controlled.  meds were recently switched due to formulary change.  Check labs to determine level of control.  UTD on eye exam, foot exam done today.  Adjust meds prn.

## 2012-04-20 NOTE — Assessment & Plan Note (Signed)
Chronic problem.  Tolerating statin w/out difficulty.  Check labs.  Adjust meds prn  

## 2012-04-20 NOTE — Patient Instructions (Signed)
Follow up in 3-4 months to recheck diabetes Keep up the good work!  You look great! We'll notify you of your lab results Call with any questions or concerns Have a great summer!!!

## 2012-04-20 NOTE — Assessment & Plan Note (Signed)
Pt's PE WNL- unchanged from previous.  UTD on mammo, dexa, eye exam.  No need for pap.  Refusing colonoscopy.  Check labs.  Anticipatory guidance provided.

## 2012-04-20 NOTE — Assessment & Plan Note (Signed)
Check labs.  Replete prn. 

## 2012-04-25 MED ORDER — FENOFIBRATE 160 MG PO TABS: 160.0000 mg | ORAL_TABLET | Freq: Every day | ORAL | Status: DC

## 2012-04-25 NOTE — Progress Notes (Signed)
Addended by: Derry Lory A on: 04/25/2012 06:31 PM   Modules accepted: Orders

## 2012-04-27 ENCOUNTER — Telehealth: Payer: Self-pay | Admitting: Cardiovascular Disease

## 2012-04-27 ENCOUNTER — Ambulatory Visit (INDEPENDENT_AMBULATORY_CARE_PROVIDER_SITE_OTHER): Payer: Medicare PPO | Admitting: Critical Care Medicine

## 2012-04-27 ENCOUNTER — Encounter: Payer: Self-pay | Admitting: Critical Care Medicine

## 2012-04-27 VITALS — BP 134/82 | HR 98 | Temp 98.1°F | Ht 64.0 in | Wt 167.0 lb

## 2012-04-27 DIAGNOSIS — J441 Chronic obstructive pulmonary disease with (acute) exacerbation: Secondary | ICD-10-CM

## 2012-04-27 NOTE — Telephone Encounter (Signed)
Most meds do interact w/ coumadin.  Fenofibrate can cause thinning of blood (higher INR) and possible bleeding.  Typically once she starts the medicine and becomes stable on it, there are no problems.  She may need more frequent PT checks at the beginning but otherwise should be fine.

## 2012-04-27 NOTE — Telephone Encounter (Signed)
Pt calling re fenofibrate was put on and pharmacist told her it could interact with her warfarin, pls call

## 2012-04-27 NOTE — Patient Instructions (Addendum)
Stop tudorza If you want your pulmonary hypertension evaluated further with a right heart catheterization, please call back No other medication changes Return to pulmonary as needed

## 2012-04-27 NOTE — Progress Notes (Signed)
Subjective:    Patient ID: Kelsey Cochran, female    DOB: 27-Aug-1930, 78 y.o.   MRN: [id number]  HPI  04/27/2012 Not better on [place].   Issue is only dyspnea.  THere is not any wheezing.  No mucus.  No chest pain. Pt denies any significant sore throat, nasal congestion or excess secretions, fever, chills, sweats, unintended weight loss, pleurtic or exertional chest pain, orthopnea PND, or leg swelling Pt denies any increase in rescue therapy over baseline, denies waking up needing it or having any early am or nocturnal exacerbations of coughing/wheezing/or dyspnea. Pt also denies any obvious fluctuation in symptoms with  weather or environmental change or other alleviating or aggravating factors    Past Medical History  Diagnosis Date  . S/P MVR (mitral valve replacement) 2005    [facility] 2005  [facility]  . Diastolic CHF, chronic   . Hypertension   . Pulmonary hypertension   . Arthritis   . TIA (transient ischemic attack)   . Diabetes mellitus   . Hypercholesterolemia   . TIA (transient ischemic attack)   . Anemia   . CRF (chronic renal failure)   . S/P tricuspid valve repair 2005    [facility]     Family History  Problem Relation Age of Onset  . Asthma Daughter   . Allergies Daughter   . Allergies Daughter   . Cancer Sister     bladder     History   Social History  . Marital Status: Widowed    Spouse Name: N/A    Number of Children: N/A  . Years of Education: N/A   Occupational History  . Retired     Charity fundraiser   Social History Main Topics  . Smoking status: Former Smoker    Quit date: 02/10/1987  . Smokeless tobacco: Never Used  . Alcohol Use: No  . Drug Use: No  . Sexually Active: Not on file   Other Topics Concern  . Not on file   Social History Narrative  . No narrative on file     Allergies  Allergen Reactions  . Heparin   . Meperidine Hcl   . Sulfamethoxazole W-Trimethoprim     REACTION: Sulfa Allergy     Outpatient Prescriptions Prior to  Visit  Medication Sig Dispense Refill  . acetaminophen (TYLENOL) 500 MG tablet Take 500 mg by mouth. As needed       . alendronate (FOSAMAX) 35 MG tablet Take 1 tablet (35 mg total) by mouth every 7 (seven) days. Take in the morning with a full glass of water, on an empty stomach, and do not take anything else by mouth or lie down for the next 30 min.  90 tablet  0  . aspirin 81 MG tablet Take 81 mg by mouth daily.        Marland Kitchen atorvastatin (LIPITOR) 20 MG tablet Take 1 tablet (20 mg total) by mouth daily.  30 tablet  2  . Diphenhydramine-APAP, sleep, (TYLENOL PM EXTRA STRENGTH PO) Take by mouth. As needed       . docusate sodium (COLACE) 100 MG capsule Take 100 mg by mouth 2 (two) times daily.        . folic acid (FOLVITE) 800 MCG tablet Take 400 mcg by mouth daily.        . furosemide (LASIX) 80 MG tablet Take 1 tablet (80 mg total) by mouth 2 (two) times daily.  180 tablet  2  . glipiZIDE (GLUCOTROL XL)  2.5 MG 24 hr tablet Take 1 tablet (2.5 mg total) by mouth daily.  30 tablet  2  . glucose blood (TRUETEST TEST) test strip Test once daily  100 each  3  . HYDROcodone-acetaminophen (VICODIN) 5-500 MG per tablet Take 1 tablet by mouth every 8 (eight) hours as needed for pain. 3 times a day as needed  60 tablet  1  . isosorbide mononitrate (IMDUR) 30 MG 24 hr tablet Take 1 tablet (30 mg total) by mouth daily.  90 tablet  2  . lisinopril (PRINIVIL,ZESTRIL) 2.5 MG tablet Take 1 tablet (2.5 mg total) by mouth daily.  90 tablet  1  . methocarbamol (ROBAXIN) 500 MG tablet Take 1 tablet (500 mg total) by mouth 2 (two) times daily. As needed  180 tablet  0  . spironolactone (ALDACTONE) 25 MG tablet Take 1 tablet (25 mg total) by mouth daily.  90 tablet  4  . warfarin (COUMADIN) 2.5 MG tablet As directed by Anticoagulation Clinic  45 tablet  3  . Aclidinium Bromide (TUDORZA PRESSAIR) 400 MCG/ACT AEPB Inhale 1 puff into the lungs 2 (two) times daily.  1 each  1  . carvedilol (COREG) 25 MG tablet Take 1 tablet  (25 mg total) by mouth 2 (two) times daily with a meal.  180 tablet  2  . fenofibrate 160 MG tablet Take 1 tablet (160 mg total) by mouth daily.  30 tablet  3       Review of Systems  Constitutional: Negative for chills, diaphoresis, activity change, appetite change, fatigue and unexpected weight change.  HENT: Negative for hearing loss, nosebleeds, congestion, facial swelling, sneezing, mouth sores, trouble swallowing, neck stiffness, dental problem, voice change, postnasal drip, sinus pressure, tinnitus and ear discharge.   Eyes: Negative for photophobia, discharge, itching and visual disturbance.  Respiratory: Negative for apnea, cough, choking, chest tightness and stridor.   Cardiovascular: Negative for palpitations.  Gastrointestinal: Positive for constipation. Negative for nausea, blood in stool and abdominal distention.  Genitourinary: Negative for dysuria, urgency, frequency, hematuria, flank pain, decreased urine volume and difficulty urinating.  Musculoskeletal: Positive for myalgias, arthralgias and gait problem. Negative for back pain and joint swelling.  Skin: Negative for color change and pallor.  Neurological: Positive for dizziness and light-headedness. Negative for tremors, seizures, syncope, speech difficulty, weakness and numbness.  Hematological: Negative for adenopathy. Bruises/bleeds easily.  Psychiatric/Behavioral: Negative for confusion, disturbed wake/sleep cycle and agitation. The patient is not nervous/anxious.        Objective:   Physical Exam   Filed Vitals:   04/27/12 1409  BP: 134/82  Pulse: 98  Temp: 98.1 F (36.7 C)  TempSrc: Oral  Height: 5\' 4"  (1.626 m)  Weight: 167 lb (75.751 kg)  SpO2: 94%    Gen: Pleasant, well-nourished, in no distress,  normal affect  ENT: No lesions,  mouth clear,  oropharynx clear, no postnasal drip  Neck: No JVD, no TMG, no carotid bruits  Lungs: No use of accessory muscles, no dullness to percussion,  Distant  BS  Cardiovascular: RRR, heart sounds normal, no murmur or gallops, no peripheral edema  Abdomen: soft and NT, no HSM,  BS normal  Musculoskeletal: No deformities, no cyanosis or clubbing  Neuro: alert, non focal  Skin: Warm, no lesions or rashes  01/18/12  CXR reviewed: CM only  3/13 Echo: Study Conclusions  - Left ventricle: Systolic function was normal. The estimated ejection fraction was in the range of 60% to 65%. - Mitral valve: A  mechanical prosthesis was present. Mean gradient: 3mm Hg (D). Peak gradient: 9mm Hg (D). - Pulmonary arteries: Systolic pressure was moderately increased. PA peak pressure: 56mm Hg (S).  02/09/12 PFT FEV1 73% FeV1/FVC 69%  total lung capacity 79% FEF 25-75  38% DLCO 57%        Assessment & Plan:   Obstructive chronic bronchitis with exacerbation Chronic obstructive airways disease with no response to [place]. Pt with pulmonary HTN possibly on basis of mitral valve disease Plan D/c tudorza Possible RHC if pt desires w/u of pulm HTN. Return as needed     Updated Medication List Outpatient Encounter Prescriptions as of 04/27/2012  Medication Sig Dispense Refill  . acetaminophen (TYLENOL) 500 MG tablet Take 500 mg by mouth. As needed       . alendronate (FOSAMAX) 35 MG tablet Take 1 tablet (35 mg total) by mouth every 7 (seven) days. Take in the morning with a full glass of water, on an empty stomach, and do not take anything else by mouth or lie down for the next 30 min.  90 tablet  0  . aspirin 81 MG tablet Take 81 mg by mouth daily.        Marland Kitchen atorvastatin (LIPITOR) 20 MG tablet Take 1 tablet (20 mg total) by mouth daily.  30 tablet  2  . carvedilol (COREG) 25 MG tablet Take 25 mg by mouth. Takes if bp is over 120      . Diphenhydramine-APAP, sleep, (TYLENOL PM EXTRA STRENGTH PO) Take by mouth. As needed       . docusate sodium (COLACE) 100 MG capsule Take 100 mg by mouth 2 (two) times daily.        . folic acid (FOLVITE) 800 MCG tablet  Take 400 mcg by mouth daily.        . furosemide (LASIX) 80 MG tablet Take 1 tablet (80 mg total) by mouth 2 (two) times daily.  180 tablet  2  . glipiZIDE (GLUCOTROL XL) 2.5 MG 24 hr tablet Take 1 tablet (2.5 mg total) by mouth daily.  30 tablet  2  . glucose blood (TRUETEST TEST) test strip Test once daily  100 each  3  . HYDROcodone-acetaminophen (VICODIN) 5-500 MG per tablet Take 1 tablet by mouth every 8 (eight) hours as needed for pain. 3 times a day as needed  60 tablet  1  . isosorbide mononitrate (IMDUR) 30 MG 24 hr tablet Take 1 tablet (30 mg total) by mouth daily.  90 tablet  2  . lisinopril (PRINIVIL,ZESTRIL) 2.5 MG tablet Take 1 tablet (2.5 mg total) by mouth daily.  90 tablet  1  . methocarbamol (ROBAXIN) 500 MG tablet Take 1 tablet (500 mg total) by mouth 2 (two) times daily. As needed  180 tablet  0  . spironolactone (ALDACTONE) 25 MG tablet Take 1 tablet (25 mg total) by mouth daily.  90 tablet  4  . warfarin (COUMADIN) 2.5 MG tablet As directed by Anticoagulation Clinic  45 tablet  3  . DISCONTD: Aclidinium Bromide (TUDORZA PRESSAIR) 400 MCG/ACT AEPB Inhale 1 puff into the lungs 2 (two) times daily.  1 each  1  . DISCONTD: carvedilol (COREG) 25 MG tablet Take 1 tablet (25 mg total) by mouth 2 (two) times daily with a meal.  180 tablet  2  . fenofibrate 160 MG tablet Take 160 mg by mouth daily. Has not started taking yet      . DISCONTD: fenofibrate 160 MG tablet Take  1 tablet (160 mg total) by mouth daily.  30 tablet  3

## 2012-04-27 NOTE — Telephone Encounter (Signed)
PER DR NISHAN PT NEEDS TO CONTACT [name]  RE  MED INTERACTION  PT AWARE IN MEANTIME HOLD FENOFIBRATE UNTIL DISCUSSES WITH  [name] PT VERBALIZED UNDERSTANDING

## 2012-04-27 NOTE — Telephone Encounter (Signed)
Follow up on previous call:   Patient calling adding cell phone for back up phone number.

## 2012-04-28 ENCOUNTER — Telehealth: Payer: Self-pay | Admitting: Family Medicine

## 2012-04-28 NOTE — Telephone Encounter (Signed)
Pt called stating coumadin clinic is ok for her to proceed with fenofibrate and she will start medication.

## 2012-04-28 NOTE — Assessment & Plan Note (Signed)
Chronic obstructive airways disease with no response to [place]. Pt with pulmonary HTN possibly on basis of mitral valve disease Plan D/c tudorza Possible RHC if pt desires w/u of pulm HTN. Return as needed

## 2012-04-28 NOTE — Telephone Encounter (Signed)
Notified pt.  She will contact the coumadin clinic to see when they will want to check her INR again. Pt states if the coumadin clinic does not want her to take fenofibrate that she will not start the medication. Advised pt to let us know of the outcome.

## 2012-04-28 NOTE — Telephone Encounter (Signed)
Attempted to reach pt , line busy

## 2012-04-28 NOTE — Telephone Encounter (Signed)
See previous phone note.  

## 2012-04-28 NOTE — Telephone Encounter (Signed)
Patient states when she went to get the Fenofibrate filled the pharmacist advised her it could have a adverse reaction with her warfarin. Please call as patient in scared to take medication now call back# [phone]

## 2012-05-08 ENCOUNTER — Telehealth: Payer: Self-pay | Admitting: *Deleted

## 2012-05-08 ENCOUNTER — Other Ambulatory Visit: Payer: Self-pay | Admitting: Family Medicine

## 2012-05-08 MED ORDER — GLIPIZIDE ER 2.5 MG PO TB24: 2.5000 mg | ORAL_TABLET | Freq: Every day | ORAL | Status: DC

## 2012-05-08 NOTE — Telephone Encounter (Signed)
Pt left vm requesting her lab results be mailed to her home address, noted correct address in chart, mailed recent labs to pt.

## 2012-05-08 NOTE — Telephone Encounter (Signed)
refill glipizide xl 2.5mg  tablets # 30, tae one tablet by mouth daily, last fill 5.24.13, last ov 6.6.13

## 2012-05-08 NOTE — Telephone Encounter (Signed)
rx sent to pharmacy by e-script  

## 2012-05-09 ENCOUNTER — Other Ambulatory Visit: Payer: Self-pay | Admitting: Cardiovascular Disease

## 2012-05-09 NOTE — Telephone Encounter (Signed)
letter mailed to patients home address with results. Called pt to advise the results will be mailed out today, pt understood

## 2012-05-16 ENCOUNTER — Ambulatory Visit (INDEPENDENT_AMBULATORY_CARE_PROVIDER_SITE_OTHER): Payer: Medicare PPO | Admitting: *Deleted

## 2012-05-16 DIAGNOSIS — I059 Rheumatic mitral valve disease, unspecified: Secondary | ICD-10-CM

## 2012-05-16 DIAGNOSIS — I4891 Unspecified atrial fibrillation: Secondary | ICD-10-CM

## 2012-05-16 DIAGNOSIS — Z9889 Other specified postprocedural states: Secondary | ICD-10-CM

## 2012-05-16 LAB — PROTIME-INR: INR: 6.4 ratio (ref 0.8–1.0)

## 2012-05-16 LAB — POCT INR: INR: 6.5

## 2012-05-29 ENCOUNTER — Ambulatory Visit (INDEPENDENT_AMBULATORY_CARE_PROVIDER_SITE_OTHER): Payer: Medicare PPO

## 2012-05-29 ENCOUNTER — Other Ambulatory Visit: Payer: Self-pay | Admitting: Family Medicine

## 2012-05-29 DIAGNOSIS — I4891 Unspecified atrial fibrillation: Secondary | ICD-10-CM

## 2012-05-29 DIAGNOSIS — I059 Rheumatic mitral valve disease, unspecified: Secondary | ICD-10-CM

## 2012-05-29 DIAGNOSIS — Z9889 Other specified postprocedural states: Secondary | ICD-10-CM

## 2012-05-29 MED ORDER — ATORVASTATIN CALCIUM 20 MG PO TABS: 20.0000 mg | ORAL_TABLET | Freq: Every day | ORAL | Status: DC

## 2012-05-29 NOTE — Telephone Encounter (Signed)
Noted refill for [place], called pt to verify if this is a current request, unable to leave vm at home number located in chart, pt noted she is not in [place] and wants refill sent to [facility] on [address] in [place], refill faxed via escribe for atorvastatin

## 2012-05-29 NOTE — Telephone Encounter (Signed)
refill Atorvastatin (Tab) 20 MG Take 1 tablet (20 mg total) by mouth daily # 30, last fill 6.9.13, last ov 6.6.13

## 2012-06-02 ENCOUNTER — Ambulatory Visit (INDEPENDENT_AMBULATORY_CARE_PROVIDER_SITE_OTHER): Payer: Medicare PPO

## 2012-06-02 DIAGNOSIS — I059 Rheumatic mitral valve disease, unspecified: Secondary | ICD-10-CM

## 2012-06-02 DIAGNOSIS — I4891 Unspecified atrial fibrillation: Secondary | ICD-10-CM

## 2012-06-02 DIAGNOSIS — Z9889 Other specified postprocedural states: Secondary | ICD-10-CM

## 2012-06-02 LAB — POCT INR: INR: 2.1

## 2012-06-05 ENCOUNTER — Encounter: Payer: Self-pay | Admitting: Pharmacist

## 2012-06-09 ENCOUNTER — Ambulatory Visit (INDEPENDENT_AMBULATORY_CARE_PROVIDER_SITE_OTHER): Payer: Medicare PPO | Admitting: Pharmacist

## 2012-06-09 DIAGNOSIS — Z9889 Other specified postprocedural states: Secondary | ICD-10-CM

## 2012-06-09 DIAGNOSIS — I059 Rheumatic mitral valve disease, unspecified: Secondary | ICD-10-CM

## 2012-06-09 DIAGNOSIS — I4891 Unspecified atrial fibrillation: Secondary | ICD-10-CM

## 2012-06-13 ENCOUNTER — Telehealth: Payer: Self-pay | Admitting: Family Medicine

## 2012-06-13 MED ORDER — ATORVASTATIN CALCIUM 20 MG PO TABS: 20.0000 mg | ORAL_TABLET | Freq: Every day | ORAL | Status: DC

## 2012-06-13 NOTE — Telephone Encounter (Signed)
Refill: Atorvastatin 20mg  tablet. Take 1 tablet by mouth once daily. Qty 30. Last fill 04-23-12

## 2012-06-13 NOTE — Telephone Encounter (Signed)
rx sent to pharmacy by e-script Per pt upcoming apt 9-13

## 2012-06-23 ENCOUNTER — Ambulatory Visit (INDEPENDENT_AMBULATORY_CARE_PROVIDER_SITE_OTHER): Payer: Medicare PPO | Admitting: Pharmacist

## 2012-06-23 DIAGNOSIS — I4891 Unspecified atrial fibrillation: Secondary | ICD-10-CM

## 2012-06-23 DIAGNOSIS — Z9889 Other specified postprocedural states: Secondary | ICD-10-CM

## 2012-06-23 DIAGNOSIS — I059 Rheumatic mitral valve disease, unspecified: Secondary | ICD-10-CM

## 2012-06-27 ENCOUNTER — Other Ambulatory Visit: Payer: Self-pay | Admitting: *Deleted

## 2012-06-27 MED ORDER — SPIRONOLACTONE 25 MG PO TABS: 25.0000 mg | ORAL_TABLET | Freq: Every day | ORAL | Status: DC

## 2012-06-27 MED ORDER — ISOSORBIDE MONONITRATE ER 30 MG PO TB24: 30.0000 mg | ORAL_TABLET | Freq: Every day | ORAL | Status: DC

## 2012-06-29 ENCOUNTER — Other Ambulatory Visit: Payer: Self-pay | Admitting: Cardiovascular Disease

## 2012-06-29 MED ORDER — SPIRONOLACTONE 25 MG PO TABS: 25.0000 mg | ORAL_TABLET | Freq: Every day | ORAL | Status: DC

## 2012-06-29 MED ORDER — ISOSORBIDE MONONITRATE ER 30 MG PO TB24: 30.0000 mg | ORAL_TABLET | Freq: Every day | ORAL | Status: DC

## 2012-07-06 ENCOUNTER — Other Ambulatory Visit: Payer: Self-pay | Admitting: *Deleted

## 2012-07-06 MED ORDER — FUROSEMIDE 80 MG PO TABS: 80.0000 mg | ORAL_TABLET | Freq: Two times a day (BID) | ORAL | Status: DC

## 2012-07-07 ENCOUNTER — Ambulatory Visit (INDEPENDENT_AMBULATORY_CARE_PROVIDER_SITE_OTHER): Payer: Medicare PPO | Admitting: Pharmacist

## 2012-07-07 DIAGNOSIS — I4891 Unspecified atrial fibrillation: Secondary | ICD-10-CM

## 2012-07-07 DIAGNOSIS — Z9889 Other specified postprocedural states: Secondary | ICD-10-CM

## 2012-07-07 DIAGNOSIS — I059 Rheumatic mitral valve disease, unspecified: Secondary | ICD-10-CM

## 2012-07-07 LAB — POCT INR: INR: 2.2

## 2012-07-13 ENCOUNTER — Other Ambulatory Visit: Payer: Self-pay | Admitting: Family Medicine

## 2012-07-13 MED ORDER — HYDROCODONE-ACETAMINOPHEN 5-500 MG PO TABS: 1.0000 | ORAL_TABLET | Freq: Three times a day (TID) | ORAL | Status: DC | PRN

## 2012-07-13 NOTE — Telephone Encounter (Signed)
.  rx faxed to pharmacy, manually.  

## 2012-07-13 NOTE — Telephone Encounter (Signed)
Last OV 04-20-12 last refill 03-03-12 #60 with 1 refill

## 2012-07-13 NOTE — Telephone Encounter (Signed)
Refill Hydrocodone-Acetaminophen (Tab) 5-500 MG Take 1 tablet by mouth every 8 (eight) hours as needed for pain. # 60  Last fill 7.3.13  Last ov 6.6.13 Hydrocodone-Acetaminophen (Tab) VICODIN 5-500 MG Take 1 tablet by mouth every 8 (eight) hours as needed for pain. 3 times a day as needed 70

## 2012-07-13 NOTE — Telephone Encounter (Signed)
Ok for #60, 1 refill 

## 2012-07-14 ENCOUNTER — Other Ambulatory Visit: Payer: Self-pay | Admitting: Cardiology

## 2012-07-14 DIAGNOSIS — I6529 Occlusion and stenosis of unspecified carotid artery: Secondary | ICD-10-CM

## 2012-07-18 ENCOUNTER — Encounter (INDEPENDENT_AMBULATORY_CARE_PROVIDER_SITE_OTHER): Payer: Medicare PPO

## 2012-07-18 DIAGNOSIS — I6529 Occlusion and stenosis of unspecified carotid artery: Secondary | ICD-10-CM

## 2012-07-21 ENCOUNTER — Ambulatory Visit (INDEPENDENT_AMBULATORY_CARE_PROVIDER_SITE_OTHER): Payer: Medicare PPO | Admitting: *Deleted

## 2012-07-21 DIAGNOSIS — I059 Rheumatic mitral valve disease, unspecified: Secondary | ICD-10-CM

## 2012-07-21 DIAGNOSIS — I4891 Unspecified atrial fibrillation: Secondary | ICD-10-CM

## 2012-07-21 DIAGNOSIS — Z9889 Other specified postprocedural states: Secondary | ICD-10-CM

## 2012-07-21 LAB — POCT INR: INR: 3.1

## 2012-07-31 ENCOUNTER — Ambulatory Visit (HOSPITAL_BASED_OUTPATIENT_CLINIC_OR_DEPARTMENT_OTHER): Payer: Medicare PPO | Admitting: Hematology & Oncology

## 2012-07-31 ENCOUNTER — Other Ambulatory Visit (HOSPITAL_BASED_OUTPATIENT_CLINIC_OR_DEPARTMENT_OTHER): Payer: Medicare PPO | Admitting: Lab

## 2012-07-31 VITALS — BP 155/85 | HR 98 | Temp 98.1°F | Resp 18 | Ht 64.0 in | Wt 163.0 lb

## 2012-07-31 DIAGNOSIS — D594 Other nonautoimmune hemolytic anemias: Secondary | ICD-10-CM

## 2012-07-31 DIAGNOSIS — D509 Iron deficiency anemia, unspecified: Secondary | ICD-10-CM

## 2012-07-31 DIAGNOSIS — Z952 Presence of prosthetic heart valve: Secondary | ICD-10-CM

## 2012-07-31 LAB — CBC WITH DIFFERENTIAL (CANCER CENTER ONLY)
BASO%: 0.4 % (ref 0.0–2.0)
EOS%: 4.3 % (ref 0.0–7.0)
HGB: 12.5 g/dL (ref 11.6–15.9)
LYMPH#: 0.8 10*3/uL — ABNORMAL LOW (ref 0.9–3.3)
MCHC: 33.2 g/dL (ref 32.0–36.0)
NEUT#: 5.2 10*3/uL (ref 1.5–6.5)
Platelets: 188 10*3/uL (ref 145–400)
RDW: 13.7 % (ref 11.1–15.7)

## 2012-07-31 LAB — FERRITIN: Ferritin: 1967 ng/mL — ABNORMAL HIGH (ref 10–291)

## 2012-07-31 LAB — IRON AND TIBC
Iron: 122 ug/dL (ref 42–145)
UIBC: 245 ug/dL (ref 125–400)

## 2012-07-31 NOTE — Patient Instructions (Signed)
Call if any bleeding

## 2012-07-31 NOTE — Progress Notes (Signed)
This office note has been dictated.

## 2012-08-01 ENCOUNTER — Ambulatory Visit (INDEPENDENT_AMBULATORY_CARE_PROVIDER_SITE_OTHER): Payer: Medicare PPO | Admitting: Family Medicine

## 2012-08-01 ENCOUNTER — Encounter: Payer: Self-pay | Admitting: Family Medicine

## 2012-08-01 VITALS — BP 122/80 | HR 83 | Temp 97.9°F | Ht 64.25 in | Wt 154.6 lb

## 2012-08-01 DIAGNOSIS — M2550 Pain in unspecified joint: Secondary | ICD-10-CM

## 2012-08-01 DIAGNOSIS — E118 Type 2 diabetes mellitus with unspecified complications: Secondary | ICD-10-CM

## 2012-08-01 DIAGNOSIS — E1165 Type 2 diabetes mellitus with hyperglycemia: Secondary | ICD-10-CM

## 2012-08-01 LAB — BASIC METABOLIC PANEL
BUN: 23 mg/dL (ref 6–23)
CO2: 26 mEq/L (ref 19–32)
Chloride: 102 mEq/L (ref 96–112)
Creatinine, Ser: 1.4 mg/dL — ABNORMAL HIGH (ref 0.4–1.2)
Glucose, Bld: 147 mg/dL — ABNORMAL HIGH (ref 70–99)

## 2012-08-01 LAB — URIC ACID: Uric Acid, Serum: 8.9 mg/dL — ABNORMAL HIGH (ref 2.4–7.0)

## 2012-08-01 NOTE — Progress Notes (Signed)
CC:   Kelsey Cochran. Kelsey Emms, MD, Kelsey Cochran, M.D.  DIAGNOSES: 1. 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 2. Intermittent iron deficiency anemia. 3. Chronic Coumadin secondary to mechanical heart valve/atrial     fibrillation.  CURRENT THERAPY: 1. Folic acid 1 mg p.o. daily. 2. IV iron as indicated. 3. Coumadin, managed by [facility].  INTERIM HISTORY:  Kelsey Cochran comes in for followup.  She is really doing well.  We last saw her back in May.  Since then, she has had some issues with the Coumadin.  She was started on gemfibrozil.  This apparently increased her anticoagulant effect and she had her Coumadin readjusted. She says she is now going every 3 weeks for Coumadin check.  There has been no bleeding.  She has had no headache.  There has been no chest pain.  There has been no leg swelling.  She has had no change in bowel or bladder habits.  On her last visit, ferritin was 1728.  Iron saturation was 41%.  PHYSICAL EXAMINATION:  This is a well-developed, well-nourished white female in no obvious distress.  Vital signs:  98.1, pulse 98, respiratory rate 18, blood pressure 155/85.  Weight is 163.  Head and neck:  Normocephalic, atraumatic skull.  There are no ocular or oral lesions.  There are no palpable cervical or supraclavicular lymph nodes. Lungs:  Clear bilaterally.  Cardiac:  Irregular rate and rhythm consistent with atrial fibrillation.  She has a systolic click secondary to her heart valve.  There is a 1/6 systolic ejection murmur.  Abdomen: Soft with good bowel sounds.  There is no palpable abdominal mass. There is no fluid wave.  There is no palpable hepatosplenomegaly. Extremities:  No clubbing, cyanosis or edema.  She may have some slight nonpitting edema in her lower legs. There are some stasis dermatitis changes in the lower legs.  Neurological:  No focal neurological deficits.  LABORATORY STUDIES:  White cell  count 6.8, hemoglobin 12.5, hematocrit 37.6, platelet count 188.  MCV is 97.  IMPRESSION:  Kelsey Cochran is a very charming 78 year old white female with chronic microangiopathic hemolytic anemia.  She is well controlled. Iron that we give her on occasion also seem to help her.  I think we can probably get her back in 6 months now.  I do not see that we need to get her back any sooner.    ______________________________ Josph Macho, M.D. PRE/MEDQ  D:  07/31/2012  T:  08/01/2012  Job:  [id number]

## 2012-08-01 NOTE — Assessment & Plan Note (Signed)
New.  Pt's migratory joint pain, redness, swelling more consistent w/ gout than arthritis.  Pt w/ family hx of gout.  Unable to take NSAIDs due to coumadin.  Check UA level.  Pt fears requiring another pill.  Discussed use of allopurinol daily or choosing to tx flares as they occur depending on their frequency.  Pt would prefer to tx as they occur but will wait and see UA level.

## 2012-08-01 NOTE — Assessment & Plan Note (Signed)
Chronic problem.  UTD on eye exam.  Asymptomatic.  Tolerating meds.  CBGs excellent by report.  Check labs.  Adjust meds prn

## 2012-08-01 NOTE — Patient Instructions (Addendum)
Follow up in 3-4 months to recheck diabetes and cholesterol We'll notify you of your lab results and make any changes if needed You look good!  Keep up the good work! Call with any questions or concerns Hang in there!!!

## 2012-08-01 NOTE — Progress Notes (Signed)
  Subjective:    Patient ID: Kelsey Cochran, female    DOB: 10-19-1930, 78 y.o.   MRN: [id number]  HPI DM- chronic problem, UTD on eye exam. Did average of Aug and Sept CBGs and it was 116.  UTD on eye exam.  No numbness or tingling of hands/feet.  No N/V/D, abd pain, CP, SOB, HAs, visual changes.   Migratory joint pains- pt reports she has had 3 recent flares, 1 in 1st MTP joint on L foot, 2nd in 1st MCP joint of R hand, and 3rd in 5th MCP joint of R hand.  Pt reports joints will become red, swollen, painful to touch.  Currently sxs have improved.  + family hx of gout.  Unable to take NSAIDs due to coumadin.  Pain required her to take left over oxycodone from dental surgery.  Review of Systems For ROS see HPI    Objective:   Physical Exam  Vitals reviewed. Constitutional: She is oriented to person, place, and time. She appears well-developed and well-nourished. No distress.  HENT:  Head: Normocephalic and atraumatic.  Eyes: Conjunctivae normal and EOM are normal. Pupils are equal, round, and reactive to light.  Neck: Normal range of motion. Neck supple. No thyromegaly present.  Cardiovascular: Normal rate, regular rhythm, normal heart sounds and intact distal pulses.   Pulmonary/Chest: Effort normal and breath sounds normal. No respiratory distress.  Abdominal: Soft. She exhibits no distension. There is no tenderness.  Musculoskeletal: She exhibits no edema.       No current joint pain, redness or swelling  Lymphadenopathy:    She has no cervical adenopathy.  Neurological: She is alert and oriented to person, place, and time.  Skin: Skin is warm and dry.  Psychiatric: She has a normal mood and affect. Her behavior is normal.          Assessment & Plan:

## 2012-08-04 ENCOUNTER — Encounter: Payer: Self-pay | Admitting: Family Medicine

## 2012-08-09 ENCOUNTER — Telehealth: Payer: Self-pay | Admitting: Family Medicine

## 2012-08-09 NOTE — Telephone Encounter (Signed)
Refill: Glipizide xl 2.5mg  tablets. Take 1 tablet by mouth every day. Qty 30. Last fill 8.30.13

## 2012-08-10 ENCOUNTER — Other Ambulatory Visit: Payer: Self-pay

## 2012-08-10 MED ORDER — GLIPIZIDE ER 2.5 MG PO TB24: 2.5000 mg | ORAL_TABLET | Freq: Every day | ORAL | Status: DC

## 2012-08-10 NOTE — Telephone Encounter (Signed)
Rx sent.    MW 

## 2012-08-11 ENCOUNTER — Ambulatory Visit (INDEPENDENT_AMBULATORY_CARE_PROVIDER_SITE_OTHER): Payer: Medicare PPO | Admitting: *Deleted

## 2012-08-11 DIAGNOSIS — Z9889 Other specified postprocedural states: Secondary | ICD-10-CM

## 2012-08-11 DIAGNOSIS — I059 Rheumatic mitral valve disease, unspecified: Secondary | ICD-10-CM

## 2012-08-11 DIAGNOSIS — I4891 Unspecified atrial fibrillation: Secondary | ICD-10-CM

## 2012-08-14 ENCOUNTER — Other Ambulatory Visit: Payer: Self-pay | Admitting: Family Medicine

## 2012-08-14 ENCOUNTER — Encounter: Payer: Self-pay | Admitting: Cardiovascular Disease

## 2012-08-14 ENCOUNTER — Ambulatory Visit (INDEPENDENT_AMBULATORY_CARE_PROVIDER_SITE_OTHER): Payer: Medicare PPO | Admitting: Cardiovascular Disease

## 2012-08-14 VITALS — BP 124/78 | HR 78 | Wt 166.0 lb

## 2012-08-14 DIAGNOSIS — I6529 Occlusion and stenosis of unspecified carotid artery: Secondary | ICD-10-CM

## 2012-08-14 DIAGNOSIS — I4891 Unspecified atrial fibrillation: Secondary | ICD-10-CM

## 2012-08-14 DIAGNOSIS — I059 Rheumatic mitral valve disease, unspecified: Secondary | ICD-10-CM

## 2012-08-14 DIAGNOSIS — I10 Essential (primary) hypertension: Secondary | ICD-10-CM

## 2012-08-14 DIAGNOSIS — I2789 Other specified pulmonary heart diseases: Secondary | ICD-10-CM

## 2012-08-14 DIAGNOSIS — IMO0002 Reserved for concepts with insufficient information to code with codable children: Secondary | ICD-10-CM

## 2012-08-14 MED ORDER — FENOFIBRATE 160 MG PO TABS: 160.0000 mg | ORAL_TABLET | Freq: Every day | ORAL | Status: DC

## 2012-08-14 NOTE — Assessment & Plan Note (Signed)
Good rate control and anticoagualtion  INR fluctuating from fenofibrate but better now

## 2012-08-14 NOTE — Assessment & Plan Note (Signed)
No TIA  F/U duplex in March  60-79% RICA

## 2012-08-14 NOTE — Assessment & Plan Note (Signed)
F/U Dr Delford Field Declines cath. Would have to stop coumadin and bridge with lovenox for procedure.  She will reconsider if dyspnea changes.  Would be candidate for Revatio

## 2012-08-14 NOTE — Assessment & Plan Note (Signed)
Well controlled.  Continue current medications and low sodium Dash type diet.    

## 2012-08-14 NOTE — Progress Notes (Signed)
Patient ID: Kelsey Cochran, female   DOB: [birth year], 78 y.o.   MRN: [id number] Kelsey Cochran is seen today for F/U of dyspnea, anticoagulaiton and MVR and carotid disease. Reviewed duplex from 9/13  and 60-79% RICA stenosis stable. . She has significant anemia with Hct 24 in past most recent 9/13 37 improved  . I reviewed her echo from 3/8  and EF normal with normal functioning MVR no evidence of perivalvular leak or other abnormality that could contribute to anemia via shearing effect/hemolysis. She apparantly has had Aranasp shots and F/U with [name] but I don't have these notes. Tried on lisinopril for proteinuria but could not tolerate for light headedness. Encouraged her to F/U with [name] to find low dose substitute   Recently put on fenofibrate for tryglycerides and INR elevated  Following with coumadin clinic to adjust dose and better. Seen by [name] and with PA pressure in mid 50's presumed secondary to MV disease offerred cath and ? Rx for pulmonary hypertension but she declined.    Reviewed carotid duplex from 07/18/12  Stable 60-79% RICA    Echo 01/21/12  MVR ok   Study Conclusions  - Left ventricle: Systolic function was normal. The estimated ejection fraction was in the range of 60% to 65%. - Mitral valve: A mechanical prosthesis was present. Mean gradient: 3mm Hg (D). Peak gradient: 9mm Hg (D). - Pulmonary arteries: Systolic pressure was moderately increased. PA peak pressure: 56mm Hg (S).  ROS: Denies fever, malais, weight loss, blurry vision, decreased visual acuity, cough, sputum, SOB, hemoptysis, pleuritic pain, palpitaitons, heartburn, abdominal pain, melena, lower extremity edema, claudication, or rash.  All other systems reviewed and negative  General: Affect appropriate Healthy:  appears stated age HEENT: normal Neck supple with no adenopathy JVP normal no bruits no thyromegaly Lungs clear with no wheezing and good diaphragmatic motion Heart:  S1 click /S2 no murmur, no  rub, gallop or click PMI normal Abdomen: benighn, BS positve, no tenderness, no AAA no bruit.  No HSM or HJR Distal pulses intact with no bruits No edema Neuro non-focal Skin warm and dry No muscular weakness   Current Outpatient Prescriptions  Medication Sig Dispense Refill  . alendronate (FOSAMAX) 35 MG tablet Take 1 tablet (35 mg total) by mouth every 7 (seven) days. Take in the morning with a full glass of water, on an empty stomach, and do not take anything else by mouth or lie down for the next 30 min.  90 tablet  0  . aspirin 81 MG tablet Take 81 mg by mouth daily.        Marland Kitchen atorvastatin (LIPITOR) 20 MG tablet Take 1 tablet (20 mg total) by mouth daily.  30 tablet  1  . carvedilol (COREG) 25 MG tablet Take 25 mg by mouth 2 (two) times daily. Takes if bp is over 120      . Diphenhydramine-APAP, sleep, (TYLENOL PM EXTRA STRENGTH PO) Take by mouth. As needed       . docusate sodium (COLACE) 100 MG capsule Take 100 mg by mouth 2 (two) times daily.        . fenofibrate 160 MG tablet Take 160 mg by mouth daily.       . folic acid (FOLVITE) 800 MCG tablet Take 400 mcg by mouth daily.        . furosemide (LASIX) 80 MG tablet Take 1 tablet (80 mg total) by mouth 2 (two) times daily.  180 tablet  2  .  glipiZIDE (GLUCOTROL XL) 2.5 MG 24 hr tablet Take 1 tablet (2.5 mg total) by mouth daily.  30 tablet  3  . glucose blood (TRUETEST TEST) test strip Test once daily  100 each  3  . HYDROcodone-acetaminophen (VICODIN) 5-500 MG per tablet Take 1 tablet by mouth every 8 (eight) hours as needed for pain. 3 times a day as needed  60 tablet  1  . isosorbide mononitrate (IMDUR) 30 MG 24 hr tablet Take 1 tablet (30 mg total) by mouth daily.  90 tablet  3  . lisinopril (PRINIVIL,ZESTRIL) 2.5 MG tablet Take 1 tablet (2.5 mg total) by mouth daily.  90 tablet  1  . methocarbamol (ROBAXIN) 500 MG tablet Take 1 tablet (500 mg total) by mouth 2 (two) times daily. As needed  180 tablet  0  .  oxyCODONE-acetaminophen (PERCOCET/ROXICET) 5-325 MG per tablet Take 1 tablet by mouth every 4 (four) hours as needed.       Marland Kitchen spironolactone (ALDACTONE) 25 MG tablet Take 1 tablet (25 mg total) by mouth daily.  90 tablet  3  . warfarin (COUMADIN) 2.5 MG tablet TAKE AS DIRECTED BY ANTICOAGULATION CLINIC  45 tablet  2  . DISCONTD: glyBURIDE (DIABETA) 5 MG tablet Take 1 tablet (5 mg total) by mouth 2 (two) times daily.  180 tablet  0    Allergies  Heparin; Meperidine hcl; and Sulfamethoxazole w-trimethoprim  Electrocardiogram:  Afib rate 78  PVC RAD nonspecific St/T wave changes  Assessment and Plan

## 2012-08-14 NOTE — Assessment & Plan Note (Signed)
Normal by echo this year.  Normal S1 click no MR

## 2012-08-14 NOTE — Patient Instructions (Signed)
Your physician wants you to follow-up in:   6 MONTHS WITH DR NISHAN  You will receive a reminder letter in the mail two months in advance. If you don't receive a letter, please call our office to schedule the follow-up appointment. Your physician recommends that you continue on your current medications as directed. Please refer to the Current Medication list given to you today. Your physician has requested that you have a carotid duplex. This test is an ultrasound of the carotid arteries in your neck. It looks at blood flow through these arteries that supply the brain with blood. Allow one hour for this exam. There are no restrictions or special instructions. DUE IN MARCH 

## 2012-08-14 NOTE — Telephone Encounter (Signed)
refill Fenofibrate (Tab) 160 MG Take 160 mg by mouth daily # 30 last fill 8.31.13--last ov 9.17.13 f/u DM

## 2012-08-14 NOTE — Telephone Encounter (Signed)
rx sent to pharmacy by e-script  

## 2012-08-16 ENCOUNTER — Telehealth: Payer: Self-pay | Admitting: Family Medicine

## 2012-08-16 DIAGNOSIS — Z23 Encounter for immunization: Secondary | ICD-10-CM

## 2012-08-16 NOTE — Telephone Encounter (Signed)
pt wants shingles shot--needs rx for Zostavax sent to [facility] on [address]  Pt would like to get injection done tomorrow afternoon at pharmacy  CB# [phone]

## 2012-08-16 NOTE — Telephone Encounter (Signed)
Pt noted that she has not called insurance in the past year, advised will call back to the insurance to confirm the co-pay is still the same and we will be glad to send the Rx whenever she would like, pt understood and noted that she will call back after speaking to the insurance

## 2012-08-18 ENCOUNTER — Telehealth: Payer: Self-pay | Admitting: *Deleted

## 2012-08-18 ENCOUNTER — Other Ambulatory Visit (INDEPENDENT_AMBULATORY_CARE_PROVIDER_SITE_OTHER): Payer: Medicare PPO

## 2012-08-18 DIAGNOSIS — R7982 Elevated C-reactive protein (CRP): Secondary | ICD-10-CM

## 2012-08-18 LAB — BASIC METABOLIC PANEL
CO2: 28 mEq/L (ref 19–32)
Chloride: 100 mEq/L (ref 96–112)
Sodium: 137 mEq/L (ref 135–145)

## 2012-08-18 NOTE — Telephone Encounter (Signed)
Patient notified of lab results, creatinine and potassium. Encouraged to increase dietary intake of leafy green vegetables, bananas, citrus fruits, OJ.

## 2012-08-18 NOTE — Telephone Encounter (Signed)
Message copied by Kelsey Cochran on Fri Aug 18, 2012 12:03 PM ------      Message from: Kelsey Cochran      Created: Fri Aug 18, 2012 11:46 AM       Cr has mildly improved.  K+ is slightly low- should increase her dietary intake in form of leafy greens, bananas, citrus fruits, OJ.  Will continue to monitor kidney fxn closely

## 2012-08-24 MED ORDER — ZOSTER VACCINE LIVE 19400 UNT/0.65ML ~~LOC~~ SOLR: 0.6500 mL | Freq: Once | SUBCUTANEOUS | Status: DC

## 2012-08-24 NOTE — Telephone Encounter (Signed)
Pt called back stating her insurance will cover the singles vaccine and she would like it called into the pharmacy.

## 2012-08-24 NOTE — Telephone Encounter (Signed)
.  rx faxed to pharmacy, manually.  

## 2012-09-08 ENCOUNTER — Encounter (INDEPENDENT_AMBULATORY_CARE_PROVIDER_SITE_OTHER): Payer: Medicare PPO | Admitting: Pharmacist

## 2012-09-08 DIAGNOSIS — I4891 Unspecified atrial fibrillation: Secondary | ICD-10-CM

## 2012-09-08 DIAGNOSIS — I059 Rheumatic mitral valve disease, unspecified: Secondary | ICD-10-CM

## 2012-09-08 DIAGNOSIS — Z9889 Other specified postprocedural states: Secondary | ICD-10-CM

## 2012-10-05 ENCOUNTER — Telehealth: Payer: Self-pay | Admitting: Family Medicine

## 2012-10-05 NOTE — Telephone Encounter (Signed)
Ok for #180 

## 2012-10-05 NOTE — Telephone Encounter (Signed)
Refill- methocarbamol 500mg  tablets. Take one tablet by mouth twice daily as needed. Qty 180 last fill 4.17.13

## 2012-10-05 NOTE — Telephone Encounter (Signed)
Last OV 08-14-12, last filled 10-20-11 #180

## 2012-10-06 ENCOUNTER — Ambulatory Visit (INDEPENDENT_AMBULATORY_CARE_PROVIDER_SITE_OTHER): Payer: Medicare PPO | Admitting: *Deleted

## 2012-10-06 DIAGNOSIS — Z9889 Other specified postprocedural states: Secondary | ICD-10-CM

## 2012-10-06 DIAGNOSIS — I059 Rheumatic mitral valve disease, unspecified: Secondary | ICD-10-CM

## 2012-10-06 DIAGNOSIS — I4891 Unspecified atrial fibrillation: Secondary | ICD-10-CM

## 2012-10-06 LAB — POCT INR: INR: 1.7

## 2012-10-06 MED ORDER — WARFARIN SODIUM 2.5 MG PO TABS: 2.5000 mg | ORAL_TABLET | ORAL | Status: DC

## 2012-10-06 MED ORDER — METHOCARBAMOL 500 MG PO TABS: 500.0000 mg | ORAL_TABLET | Freq: Two times a day (BID) | ORAL | Status: DC

## 2012-10-06 NOTE — Telephone Encounter (Signed)
Rx sent 

## 2012-10-06 NOTE — Addendum Note (Signed)
Addended by: Carmela Hurt on: 10/06/2012 09:50 AM   Modules accepted: Orders

## 2012-10-19 ENCOUNTER — Ambulatory Visit (INDEPENDENT_AMBULATORY_CARE_PROVIDER_SITE_OTHER): Payer: Medicare PPO

## 2012-10-19 DIAGNOSIS — I059 Rheumatic mitral valve disease, unspecified: Secondary | ICD-10-CM

## 2012-10-19 DIAGNOSIS — Z9889 Other specified postprocedural states: Secondary | ICD-10-CM

## 2012-10-19 DIAGNOSIS — I4891 Unspecified atrial fibrillation: Secondary | ICD-10-CM

## 2012-10-19 LAB — POCT INR: INR: 2.1

## 2012-10-26 ENCOUNTER — Telehealth: Payer: Self-pay | Admitting: Family Medicine

## 2012-10-26 DIAGNOSIS — M629 Disorder of muscle, unspecified: Secondary | ICD-10-CM

## 2012-10-26 MED ORDER — METHOCARBAMOL 500 MG PO TABS: 500.0000 mg | ORAL_TABLET | Freq: Two times a day (BID) | ORAL | Status: DC

## 2012-10-26 NOTE — Telephone Encounter (Signed)
Rx for Robaxin sent in to pharmacy, pt notified.

## 2012-10-26 NOTE — Telephone Encounter (Signed)
Refill: Methocarbamol 500 mg tablets. Take 1 tablet by mouth twice daily as needed. Qty 180. Last fill 03-08-12

## 2012-10-26 NOTE — Telephone Encounter (Signed)
Rx for Robaxin sent to pharmacy, pt notified.

## 2012-11-02 ENCOUNTER — Ambulatory Visit (INDEPENDENT_AMBULATORY_CARE_PROVIDER_SITE_OTHER): Payer: Medicare PPO

## 2012-11-02 DIAGNOSIS — I059 Rheumatic mitral valve disease, unspecified: Secondary | ICD-10-CM

## 2012-11-02 DIAGNOSIS — I4891 Unspecified atrial fibrillation: Secondary | ICD-10-CM

## 2012-11-02 DIAGNOSIS — Z9889 Other specified postprocedural states: Secondary | ICD-10-CM

## 2012-11-02 LAB — POCT INR: INR: 2.5

## 2012-11-06 ENCOUNTER — Other Ambulatory Visit: Payer: Self-pay | Admitting: Family Medicine

## 2012-11-06 NOTE — Telephone Encounter (Signed)
refill Warfarin Sodium (Tab) 2.5 MG Take 1 tablet (2.5 mg total) by mouth as directed #35 last fill 11.22.13

## 2012-11-14 ENCOUNTER — Other Ambulatory Visit: Payer: Self-pay | Admitting: *Deleted

## 2012-11-14 MED ORDER — WARFARIN SODIUM 2.5 MG PO TABS: ORAL_TABLET | ORAL | Status: DC

## 2012-11-17 ENCOUNTER — Encounter: Payer: Self-pay | Admitting: Family Medicine

## 2012-11-17 ENCOUNTER — Ambulatory Visit (INDEPENDENT_AMBULATORY_CARE_PROVIDER_SITE_OTHER): Payer: Medicare PPO | Admitting: Family Medicine

## 2012-11-17 VITALS — BP 130/80 | HR 116 | Temp 97.7°F | Ht 64.25 in | Wt 162.6 lb

## 2012-11-17 DIAGNOSIS — N390 Urinary tract infection, site not specified: Secondary | ICD-10-CM

## 2012-11-17 DIAGNOSIS — E7800 Pure hypercholesterolemia, unspecified: Secondary | ICD-10-CM

## 2012-11-17 DIAGNOSIS — R319 Hematuria, unspecified: Secondary | ICD-10-CM

## 2012-11-17 DIAGNOSIS — I10 Essential (primary) hypertension: Secondary | ICD-10-CM

## 2012-11-17 DIAGNOSIS — E1165 Type 2 diabetes mellitus with hyperglycemia: Secondary | ICD-10-CM

## 2012-11-17 LAB — BASIC METABOLIC PANEL
Chloride: 101 mEq/L (ref 96–112)
Creatinine, Ser: 1.6 mg/dL — ABNORMAL HIGH (ref 0.4–1.2)
Potassium: 3.4 mEq/L — ABNORMAL LOW (ref 3.5–5.1)

## 2012-11-17 LAB — LIPID PANEL
Cholesterol: 170 mg/dL (ref 0–200)
LDL Cholesterol: 101 mg/dL — ABNORMAL HIGH (ref 0–99)
Triglycerides: 135 mg/dL (ref 0.0–149.0)
VLDL: 27 mg/dL (ref 0.0–40.0)

## 2012-11-17 LAB — HEPATIC FUNCTION PANEL
AST: 22 U/L (ref 0–37)
Total Bilirubin: 1.2 mg/dL (ref 0.3–1.2)

## 2012-11-17 LAB — HEMOGLOBIN A1C: Hgb A1c MFr Bld: 7.2 % — ABNORMAL HIGH (ref 4.6–6.5)

## 2012-11-17 NOTE — Assessment & Plan Note (Signed)
Chronic problem.  Tolerating statin w/out difficulty.  Asymptomatic.  Check labs.  Adjust meds prn

## 2012-11-17 NOTE — Assessment & Plan Note (Signed)
Chronic problem, usually well controlled on minimal meds.  UTD on eye exam.  Asymptomatic.  Foot exam performed today.  Check labs.  Adjust meds prn

## 2012-11-17 NOTE — Assessment & Plan Note (Signed)
Chronic problem, adequate control.  Asymptomatic.  HR rapid this AM but pt has not taken Coreg or other meds due to fasting.  Encouraged her to take meds once she returned home.  Pt expressed understanding and is in agreement w/ plan.

## 2012-11-17 NOTE — Progress Notes (Signed)
  Subjective:    Patient ID: Kelsey Cochran, female    DOB: 18-Jan-1930, 79 y.o.   MRN: [id number]  HPI HTN- chronic problem, adequate control.  On Coreg, lasix, imdur, lisinopril, spironolactone.  No CP, SOB above baseline, HAs, visual changes, edema.  Hyperlipidemia- chronic problem, on lipitor daily.  No abd pain, N/V, myalgias.  DM- chronic problem, pt reports CBGs were well controlled over the holidays.  On Glipizide.  UTD on eye exam.  Pt reports decreased sensation in toes bilaterally, L>R.  Denies symptomatic lows.   Review of Systems For ROS see HPI     Objective:   Physical Exam  Constitutional: She is oriented to person, place, and time. She appears well-developed and well-nourished. No distress.  HENT:  Head: Normocephalic and atraumatic.  Eyes: Conjunctivae normal and EOM are normal. Pupils are equal, round, and reactive to light.  Neck: Normal range of motion. Neck supple. No thyromegaly present.  Cardiovascular: Normal rate, regular rhythm and intact distal pulses.        Valvular click  Pulmonary/Chest: Effort normal and breath sounds normal. No respiratory distress.  Abdominal: Soft. She exhibits no distension. There is no tenderness.  Musculoskeletal: She exhibits no edema.  Lymphadenopathy:    She has no cervical adenopathy.  Neurological: She is alert and oriented to person, place, and time.  Skin: Skin is warm and dry.  Psychiatric: She has a normal mood and affect. Her behavior is normal.          Assessment & Plan:

## 2012-11-17 NOTE — Patient Instructions (Addendum)
Follow up in 3-4 months to recheck diabetes We'll notify you of your lab results and make any changes if needed Keep up the good work! You look great!! Call with any questions or concerns Happy New Year!!! 

## 2012-11-21 LAB — POCT URINALYSIS DIPSTICK
Spec Grav, UA: 1.01
Urobilinogen, UA: 0.2

## 2012-11-21 NOTE — Addendum Note (Signed)
Addended by: Candie Echevaria L on: 11/21/2012 11:26 AM   Modules accepted: Orders

## 2012-11-23 ENCOUNTER — Ambulatory Visit (INDEPENDENT_AMBULATORY_CARE_PROVIDER_SITE_OTHER): Payer: Medicare PPO | Admitting: *Deleted

## 2012-11-23 DIAGNOSIS — Z9889 Other specified postprocedural states: Secondary | ICD-10-CM

## 2012-11-23 DIAGNOSIS — I059 Rheumatic mitral valve disease, unspecified: Secondary | ICD-10-CM

## 2012-11-23 DIAGNOSIS — I4891 Unspecified atrial fibrillation: Secondary | ICD-10-CM

## 2012-11-24 ENCOUNTER — Telehealth: Payer: Self-pay | Admitting: Family Medicine

## 2012-11-24 MED ORDER — GLUCOSE BLOOD VI STRP: ORAL_STRIP | Status: DC

## 2012-11-24 MED ORDER — CIPROFLOXACIN HCL 250 MG PO TABS: 250.0000 mg | ORAL_TABLET | Freq: Two times a day (BID) | ORAL | Status: DC

## 2012-11-24 NOTE — Telephone Encounter (Signed)
Refill: True test blood glucose strip 100's. Test blood sugar once daily. Qty 100. Last fill 08-16-12

## 2012-11-24 NOTE — Addendum Note (Signed)
Addended by: Edwena Felty T on: 11/24/2012 04:46 PM   Modules accepted: Orders

## 2012-11-28 ENCOUNTER — Other Ambulatory Visit: Payer: Self-pay | Admitting: *Deleted

## 2012-11-28 DIAGNOSIS — E119 Type 2 diabetes mellitus without complications: Secondary | ICD-10-CM

## 2012-11-28 MED ORDER — GLUCOSE BLOOD VI STRP: ORAL_STRIP | Status: DC

## 2012-11-28 NOTE — Telephone Encounter (Signed)
Refill for true test test strips sent to pharmacy

## 2012-12-04 ENCOUNTER — Telehealth: Payer: Self-pay | Admitting: Family Medicine

## 2012-12-04 DIAGNOSIS — E119 Type 2 diabetes mellitus without complications: Secondary | ICD-10-CM

## 2012-12-04 MED ORDER — GLIPIZIDE ER 2.5 MG PO TB24: 2.5000 mg | ORAL_TABLET | Freq: Every day | ORAL | Status: DC

## 2012-12-04 NOTE — Telephone Encounter (Signed)
Refill for glipizide sent to [place], [address]

## 2012-12-04 NOTE — Telephone Encounter (Signed)
REFILL GlipiZIDE (Tablet SR 24 hr) XL 2.5 MG Take 1 tablet (2.5 mg total) by mouth daily. #30 LAST FILL 12.22.13

## 2012-12-21 ENCOUNTER — Ambulatory Visit (INDEPENDENT_AMBULATORY_CARE_PROVIDER_SITE_OTHER): Payer: Medicare PPO | Admitting: *Deleted

## 2012-12-21 DIAGNOSIS — Z9889 Other specified postprocedural states: Secondary | ICD-10-CM

## 2012-12-21 DIAGNOSIS — I4891 Unspecified atrial fibrillation: Secondary | ICD-10-CM

## 2012-12-21 DIAGNOSIS — I059 Rheumatic mitral valve disease, unspecified: Secondary | ICD-10-CM

## 2012-12-22 ENCOUNTER — Telehealth: Payer: Self-pay | Admitting: Family Medicine

## 2012-12-22 MED ORDER — ATORVASTATIN CALCIUM 20 MG PO TABS: 20.0000 mg | ORAL_TABLET | Freq: Every day | ORAL | Status: DC

## 2012-12-22 NOTE — Telephone Encounter (Signed)
Refill done.  

## 2012-12-22 NOTE — Telephone Encounter (Signed)
Refill: Atorvastatin 20mg  tablets. Take 1 tablet by mouth daily. Qty 30. Last fill 11-21-12

## 2013-01-01 ENCOUNTER — Ambulatory Visit (INDEPENDENT_AMBULATORY_CARE_PROVIDER_SITE_OTHER): Payer: Medicare PPO | Admitting: Family Medicine

## 2013-01-01 ENCOUNTER — Telehealth: Payer: Self-pay | Admitting: Family Medicine

## 2013-01-01 ENCOUNTER — Encounter: Payer: Self-pay | Admitting: Family Medicine

## 2013-01-01 VITALS — BP 120/60 | HR 71 | Temp 97.9°F | Ht 64.25 in | Wt 166.0 lb

## 2013-01-01 DIAGNOSIS — G5600 Carpal tunnel syndrome, unspecified upper limb: Secondary | ICD-10-CM

## 2013-01-01 NOTE — Progress Notes (Signed)
  Subjective:    Patient ID: LIS SAVITT, female    DOB: 1930/06/08, 79 y.o.   MRN: [id number]  HPI R hand numbness- numbness most notable in thumb and index finger but 3-5th digits also involved.  Also having some wrist pain.  sxs are worse when cold.  sxs started 2.5 weeks ago.  No known injury.  Numbness is progressing.  Denies weakness of grip.  R handed- uses this hand to ambulate w/ cane.  Will also have sxs at night.  Pain improves when wrist is held in slightly cocked position.   Review of Systems For ROS see HPI     Objective:   Physical Exam  Vitals reviewed. Constitutional: She appears well-developed and well-nourished. No distress.  Cardiovascular: Intact distal pulses.   Musculoskeletal: Normal range of motion. She exhibits edema (over ventral surface of R wrist at area of carpal tunnel).  + phalen's sign on R  Neurological: She has normal reflexes. Coordination normal.  Grip strength 5/5 and symmetric          Assessment & Plan:

## 2013-01-01 NOTE — Assessment & Plan Note (Signed)
New.  Pt's sxs and PE consistent w/ carpal tunnel- likely from using her cane w/ this hand.  Pt to ice, wear wrist splint given in office today.  Unable to take NSAIDs due to coumadin.  Will refer to ortho for evaluation and tx.  Pt expressed understanding and is in agreement w/ plan.

## 2013-01-01 NOTE — Telephone Encounter (Signed)
Patient Information:  Caller Name: Pecola  Phone: [phone]  Patient: Kelsey Cochran, Kelsey Cochran  Gender: Female  DOB: 04-Sep-1930  Age: 79 Years  PCP: Sheliah Hatch.  Office Follow Up:  Does the office need to follow up with this patient?: No  Instructions For The Office: N/A  RN Note:  Contacted patient and started triage when she noted she had an 11:00 appointment.  RN stopped triage to allow her to go so she will be on time for her 11:00 appointment today.  Symptoms  Reason For Call & Symptoms: Fingers in right hand becoming numbness  started slight but has gotten progressive worse.  Has pain in right hand if it gets cold in thumb and forefinger.  Reviewed Health History In EMR: N/A  Reviewed Medications In EMR: N/A  Reviewed Allergies In EMR: N/A  Reviewed Surgeries / Procedures: N/A  Date of Onset of Symptoms: 12/18/2012  Guideline(s) Used:  No Protocol Available - Sick Adult  Disposition Per Guideline:   See Today in Office  Reason For Disposition Reached:   Patient wants to be seen  Advice Given:  Call Back If:  You become worse.  Appointment Scheduled:  01/01/2013 11:00:00 Appointment Scheduled Provider:  Sheliah Hatch.

## 2013-01-01 NOTE — Patient Instructions (Addendum)
We'll call you with your Ortho appt This appears to be carpal tunnel ICE if you can stand it- otherwise use heat Wear the brace when resting Call with any questions or concerns Hang in there!

## 2013-01-01 NOTE — Telephone Encounter (Signed)
Pt seen in office today.

## 2013-01-15 ENCOUNTER — Telehealth: Payer: Self-pay | Admitting: Family Medicine

## 2013-01-15 NOTE — Telephone Encounter (Signed)
refill Hydrocodone-Acetaminophen (Tab) 5-500 MG Take 1 tablet by mouth every 8 (eight) hours as needed for pain #60 last fill 12.9.13

## 2013-01-16 NOTE — Telephone Encounter (Signed)
Ok for #60, 1 refill 

## 2013-01-16 NOTE — Telephone Encounter (Signed)
Please advise on RF request.  Last fill:10-23-12,no recent CPE-last OV:11-17-12.//AB/CMA

## 2013-01-17 ENCOUNTER — Other Ambulatory Visit: Payer: Self-pay | Admitting: Pharmacist

## 2013-01-17 MED ORDER — HYDROCODONE-ACETAMINOPHEN 5-500 MG PO TABS: 1.0000 | ORAL_TABLET | Freq: Three times a day (TID) | ORAL | Status: DC | PRN

## 2013-01-17 MED ORDER — HYDROCODONE-ACETAMINOPHEN 5-325 MG PO TABS: ORAL_TABLET | ORAL | Status: DC

## 2013-01-17 MED ORDER — WARFARIN SODIUM 2.5 MG PO TABS: ORAL_TABLET | ORAL | Status: DC

## 2013-01-17 NOTE — Telephone Encounter (Signed)
Rx printed and faxed to the pharmacy([facility]).//AB/CMA

## 2013-01-17 NOTE — Telephone Encounter (Signed)
Verbal ok given per [name] to change to 5-325 mg

## 2013-01-17 NOTE — Addendum Note (Signed)
Addended byDuaine Dredge, Sunny Schlein L on: 01/17/2013 05:01 PM   Modules accepted: Orders

## 2013-01-18 ENCOUNTER — Telehealth: Payer: Self-pay | Admitting: Hematology & Oncology

## 2013-01-18 ENCOUNTER — Ambulatory Visit (INDEPENDENT_AMBULATORY_CARE_PROVIDER_SITE_OTHER): Payer: Medicare PPO | Admitting: *Deleted

## 2013-01-18 DIAGNOSIS — I4891 Unspecified atrial fibrillation: Secondary | ICD-10-CM

## 2013-01-18 DIAGNOSIS — Z9889 Other specified postprocedural states: Secondary | ICD-10-CM

## 2013-01-18 DIAGNOSIS — I059 Rheumatic mitral valve disease, unspecified: Secondary | ICD-10-CM

## 2013-01-18 NOTE — Telephone Encounter (Signed)
Due to no provider in the office on 01/29/13.  Patient's apt was cx and resch for 02/26/13.  Patient was called and given resch apt date/time.  She is aware of new apt

## 2013-01-29 ENCOUNTER — Ambulatory Visit: Payer: Medicare PPO | Admitting: Hematology & Oncology

## 2013-01-29 ENCOUNTER — Other Ambulatory Visit: Payer: Medicare PPO | Admitting: Lab

## 2013-01-31 ENCOUNTER — Other Ambulatory Visit: Payer: Self-pay | Admitting: *Deleted

## 2013-01-31 MED ORDER — ISOSORBIDE MONONITRATE ER 30 MG PO TB24: 30.0000 mg | ORAL_TABLET | Freq: Every day | ORAL | Status: DC

## 2013-02-12 ENCOUNTER — Telehealth: Payer: Self-pay | Admitting: Cardiovascular Disease

## 2013-02-12 NOTE — Telephone Encounter (Signed)
I would think coumadin needs to be held at least 4 days before surgery but Dr Janee Morn needs to tell her

## 2013-02-12 NOTE — Telephone Encounter (Signed)
PER PT HAS USED   LOVENOX IN PAST DOES PT REQUIRE  INJECTION   HAS MECHANICAL VALVE .Kelsey Cochran

## 2013-02-12 NOTE — Telephone Encounter (Signed)
PT AWARE  WILL FORWARD TO DR Eden Emms FOR REVIEW./CY

## 2013-02-12 NOTE — Telephone Encounter (Signed)
New Problem:     Patient called in because she is due to have wrist surgery next week with Dr. Janee Morn and would like to know how she needs to proceed with holding her coumadin 5 days prior to her surgery.  Please call back.

## 2013-02-13 LAB — HM DIABETES EYE EXAM

## 2013-02-13 NOTE — Telephone Encounter (Signed)
She is in afib with a mechanical MV so yes lovenox [facility] can help with this?

## 2013-02-13 NOTE — Telephone Encounter (Signed)
PT IS SCHEDULED FOR CARPEL TUNNEL SURGERY ON 02-20-13 WITH DR Janee Morn DR THOMPSON WOULD LIKE PT TO  HOLD COUMADIN STARTING 02-15-13 AND MAY RESUME  DAY AFTER SURGERY WILL FORWARD TO [facility]  TO COORDINATE LOVENOX .Kelsey Cochran

## 2013-02-14 MED ORDER — ENOXAPARIN SODIUM 80 MG/0.8ML ~~LOC~~ SOLN: 80.0000 mg | Freq: Every day | SUBCUTANEOUS | Status: DC

## 2013-02-14 NOTE — Telephone Encounter (Signed)
Pt's CrCl- 33 mL/min.  Since borderline, will dose at 1mg /kg once daily. Reviewed pt's allergy to heparin.  Was added to chart in 2010 after hospitalization.  Pt received heparin in the hospital and had a fall in platelets.  Was never diagnosed with HIT.   4/2- Last day of Coumadin 4/3- No Coumadin or Lovenox 4/4- Lovenox 80mg  once daily 4/5- Lovenox 80mg  once daily 4/6- Lovenox 80mg  once daily 4/7- Lovenox 80mg  once daily 4/8- Day of Procedure.  4/9- Coumadin 5mg  and Lovenox 80mg  once daily 4/10- Coumadin 7.5mg  and Lovenox 80mg  once daily 4/11- Coumadin 2.5mg  and Lovenox 80mg  once daily 4/12- Coumadin 2.5mg  and Lovenox 80mg  once daily 4/13- Coumadin 2.5mg  and Lovenox 80mg  once daily 4/14- Recheck INR

## 2013-02-14 NOTE — Telephone Encounter (Signed)
Spoke with pt.  She is aware of instructions.

## 2013-02-15 ENCOUNTER — Telehealth: Payer: Self-pay | Admitting: Hematology & Oncology

## 2013-02-15 NOTE — Telephone Encounter (Signed)
Per MD to cx 02/26/13 apt due to being out of the office.  Apt was resch for 03/20/13.  i called and did not get an answer so i left a message on patient's voice mail about cx apt and resch apt date/time.

## 2013-02-16 ENCOUNTER — Ambulatory Visit (INDEPENDENT_AMBULATORY_CARE_PROVIDER_SITE_OTHER): Payer: Medicare PPO | Admitting: Family Medicine

## 2013-02-16 ENCOUNTER — Encounter: Payer: Self-pay | Admitting: Family Medicine

## 2013-02-16 VITALS — BP 120/60 | HR 73 | Temp 97.9°F | Ht 64.25 in | Wt 159.8 lb

## 2013-02-16 DIAGNOSIS — E118 Type 2 diabetes mellitus with unspecified complications: Secondary | ICD-10-CM

## 2013-02-16 DIAGNOSIS — E1165 Type 2 diabetes mellitus with hyperglycemia: Secondary | ICD-10-CM

## 2013-02-16 DIAGNOSIS — I10 Essential (primary) hypertension: Secondary | ICD-10-CM

## 2013-02-16 LAB — BASIC METABOLIC PANEL
BUN: 21 mg/dL (ref 6–23)
Calcium: 8.4 mg/dL (ref 8.4–10.5)
GFR: 42.99 mL/min — ABNORMAL LOW (ref >60.00)
Glucose, Bld: 138 mg/dL — ABNORMAL HIGH (ref 70–99)
Sodium: 137 mEq/L (ref 135–145)

## 2013-02-16 NOTE — Progress Notes (Signed)
  Subjective:    Patient ID: Kelsey Cochran, female    DOB: August 07, 1930, 79 y.o.   MRN: [id number]  HPI DM- chronic problem, on glipizide.  Took Lisinopril for short time but caused dizziness and she couldn't tolerate.  UTD on eye exam.  Denies symptomatic lows.  AM CBG 125.  No N/V/D.  HTN- chronic problem, excellent control.  On Coreg, lasix, imdur, aldactone.  No CP, SOB above baseline, HAs, visual changes, edema.   Review of Systems For ROS see HPI     Objective:   Physical Exam  Vitals reviewed. Constitutional: She is oriented to person, place, and time. She appears well-developed and well-nourished. No distress.  HENT:  Head: Normocephalic and atraumatic.  Eyes: Conjunctivae and EOM are normal. Pupils are equal, round, and reactive to light.  Neck: Normal range of motion. Neck supple. No thyromegaly present.  Cardiovascular: Normal rate, regular rhythm and intact distal pulses.   No murmur heard. Mechanical click  Pulmonary/Chest: Effort normal and breath sounds normal. No respiratory distress.  Abdominal: Soft. She exhibits no distension. There is no tenderness.  Musculoskeletal: She exhibits no edema.  Lymphadenopathy:    She has no cervical adenopathy.  Neurological: She is alert and oriented to person, place, and time.  Skin: Skin is warm and dry.  Psychiatric: She has a normal mood and affect. Her behavior is normal.          Assessment & Plan:

## 2013-02-16 NOTE — Assessment & Plan Note (Signed)
Chronic problem.  Excellent control.  Asymptomatic at this time.  No med changes.  Check labs.

## 2013-02-16 NOTE — Assessment & Plan Note (Signed)
Chronic problem.  Hx of adequate control.  UTD on eye exam.  Asymptomatic.  Check labs.  Adjust meds prn

## 2013-02-16 NOTE — Patient Instructions (Addendum)
Follow up in 3-4 months to recheck diabetes and cholesterol We'll notify you of your lab results and make any changes if needed Keep up the good work!  You look great! Good luck w/ surgery!!

## 2013-02-19 ENCOUNTER — Encounter (INDEPENDENT_AMBULATORY_CARE_PROVIDER_SITE_OTHER): Payer: Medicare PPO

## 2013-02-19 DIAGNOSIS — I6529 Occlusion and stenosis of unspecified carotid artery: Secondary | ICD-10-CM

## 2013-02-26 ENCOUNTER — Ambulatory Visit: Payer: Medicare PPO | Admitting: Hematology & Oncology

## 2013-02-26 ENCOUNTER — Other Ambulatory Visit: Payer: Medicare PPO | Admitting: Lab

## 2013-02-26 ENCOUNTER — Ambulatory Visit (INDEPENDENT_AMBULATORY_CARE_PROVIDER_SITE_OTHER): Payer: Medicare PPO

## 2013-02-26 DIAGNOSIS — Z9889 Other specified postprocedural states: Secondary | ICD-10-CM

## 2013-02-26 DIAGNOSIS — I4891 Unspecified atrial fibrillation: Secondary | ICD-10-CM

## 2013-02-26 DIAGNOSIS — I059 Rheumatic mitral valve disease, unspecified: Secondary | ICD-10-CM

## 2013-02-28 ENCOUNTER — Telehealth: Payer: Self-pay | Admitting: Family Medicine

## 2013-02-28 MED ORDER — POTASSIUM CHLORIDE CRYS ER 20 MEQ PO TBCR: 20.0000 meq | EXTENDED_RELEASE_TABLET | Freq: Every day | ORAL | Status: DC

## 2013-02-28 MED ORDER — FENOFIBRATE 160 MG PO TABS: 160.0000 mg | ORAL_TABLET | Freq: Every day | ORAL | Status: DC

## 2013-02-28 NOTE — Telephone Encounter (Signed)
Message copied by Verdene Rio on Wed Feb 28, 2013  3:25 PM ------      Message from: Sheliah Hatch      Created: Fri Feb 16, 2013  4:42 PM       Labs look good w/ exception of low K+.  Needs to start Kdur daily. ------

## 2013-02-28 NOTE — Telephone Encounter (Signed)
Refill: Fenofibrate 160 mg tablets. Take 1 tablet by mouth once daily. Qty 30. Last fill 01-28-13

## 2013-02-28 NOTE — Telephone Encounter (Signed)
Discuss with patient, Rx sent. 

## 2013-03-08 ENCOUNTER — Other Ambulatory Visit: Payer: Self-pay | Admitting: *Deleted

## 2013-03-08 MED ORDER — FUROSEMIDE 80 MG PO TABS: 80.0000 mg | ORAL_TABLET | Freq: Two times a day (BID) | ORAL | Status: DC

## 2013-03-15 ENCOUNTER — Encounter: Payer: Self-pay | Admitting: Family Medicine

## 2013-03-20 ENCOUNTER — Telehealth: Payer: Self-pay | Admitting: Hematology & Oncology

## 2013-03-20 ENCOUNTER — Ambulatory Visit (HOSPITAL_BASED_OUTPATIENT_CLINIC_OR_DEPARTMENT_OTHER): Payer: Medicare PPO | Admitting: Medical

## 2013-03-20 ENCOUNTER — Other Ambulatory Visit (HOSPITAL_BASED_OUTPATIENT_CLINIC_OR_DEPARTMENT_OTHER): Payer: Medicare PPO | Admitting: Lab

## 2013-03-20 VITALS — BP 126/67 | HR 71 | Temp 97.7°F | Resp 16 | Ht 64.0 in | Wt 168.0 lb

## 2013-03-20 DIAGNOSIS — D649 Anemia, unspecified: Secondary | ICD-10-CM

## 2013-03-20 DIAGNOSIS — D509 Iron deficiency anemia, unspecified: Secondary | ICD-10-CM

## 2013-03-20 DIAGNOSIS — D594 Other nonautoimmune hemolytic anemias: Secondary | ICD-10-CM

## 2013-03-20 LAB — CBC WITH DIFFERENTIAL (CANCER CENTER ONLY)
BASO%: 0.8 % (ref 0.0–2.0)
EOS%: 4.7 % (ref 0.0–7.0)
LYMPH%: 12.6 % — ABNORMAL LOW (ref 14.0–48.0)
MCH: 32.2 pg (ref 26.0–34.0)
MCV: 99 fL (ref 81–101)
MONO%: 6.9 % (ref 0.0–13.0)
Platelets: 157 10*3/uL (ref 145–400)
RDW: 14 % (ref 11.1–15.7)
WBC: 7.2 10*3/uL (ref 3.9–10.0)

## 2013-03-20 LAB — CHCC SATELLITE - SMEAR

## 2013-03-20 LAB — IRON AND TIBC
%SAT: 32 % (ref 20–55)
TIBC: 359 ug/dL (ref 250–470)
UIBC: 244 ug/dL (ref 125–400)

## 2013-03-20 NOTE — Telephone Encounter (Signed)
Patient's 09-20-13 apt calendar was mailed out

## 2013-03-20 NOTE — Progress Notes (Signed)
DIAGNOSES: 1. 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 2. Intermittent iron deficiency anemia. 3. Chronic Coumadin secondary to mechanical heart valve/atrial     fibrillation.  CURRENT THERAPY: 1. Folic acid 1 mg p.o. daily. 2. IV iron as indicated. 3. Coumadin, managed by [facility].  INTERIM HISTORY:  Kelsey Cochran presents today for an office followup visit.  Overall, she is doing really well.  She does have quite a few medical issues, but they are all controlled.  She recently had carpal tunnel surgery on her right hand.  This was on April 8.  She still has some numbness in her fingertips, and is on Neurontin. She remains on Coumadin without any problems.  She does not report any excessive fatigue or weakness.  She's not reporting any obvious, bleeding.  She, reports, a good appetite.  She denies any nausea, vomiting, diarrhea, constipation, chest pain, shortness of breath, or cough.  She denies any fevers, chills, or night sweats.  She denies any lower leg swelling, any obvious, or normal, bleeding, any, headaches, visual changes, or rashes.  She denies any changes in her bowel or bladder, habits.  It has been quite some time, since she's had to have IV iron.  Her last iron panel back in September of 2013, revealed a ferritin of 1967, an iron of 122, with 33% saturation.  Review of Systems: Constitutional:Negative for malaise/fatigue, fever, chills, weight loss, diaphoresis, activity change, appetite change, and unexpected weight change.  HEENT: Negative for double vision, blurred vision, visual loss, ear pain, tinnitus, congestion, rhinorrhea, epistaxis sore throat or sinus disease, oral pain/lesion, tongue soreness Respiratory: Negative for cough, chest tightness, shortness of breath, wheezing and stridor.  Cardiovascular: Negative for chest pain, palpitations, leg swelling, orthopnea, PND, DOE or claudication Gastrointestinal: Negative for nausea,  vomiting, abdominal pain, diarrhea, constipation, blood in stool, melena, hematochezia, abdominal distention, anal bleeding, rectal pain, anorexia and hematemesis.  Genitourinary: Negative for dysuria, frequency, hematuria,  Musculoskeletal: Negative for myalgias, back pain, joint swelling, arthralgias and gait problem.  Skin: Negative for rash, color change, pallor and wound.  Neurological:. Negative for dizziness/light-headedness, tremors, seizures, syncope, facial asymmetry, speech difficulty, weakness, numbness, headaches and paresthesias.  Hematological: Negative for adenopathy. Does not bruise/bleed easily.  Psychiatric/Behavioral:  Negative for depression, no loss of interest in normal activity or change in sleep pattern.   Physical Exam: This is an elderly, 79 year old, white female, who is well-developed, well-nourished, in no obvious distress Vitals: Temperature 97.7 degrees, pulse 71, respirations 16, blood pressure 126/67, weight 168 pounds HEENT reveals a normocephalic, atraumatic skull, no scleral icterus, no oral lesions  Neck is supple without any cervical or supraclavicular adenopathy.  Lungs are clear to auscultation bilaterally. There are no wheezes, rales or rhonci Cardiac is regular rate and rhythm with a normal S1 and S2. There are no murmurs, rubs, or bruits.  Abdomen is soft with good bowel sounds, there is no palpable mass. There is no palpable hepatosplenomegaly. There is no palpable fluid wave.  Musculoskeletal no tenderness of the spine, ribs, or hips.  Extremities there are no clubbing, cyanosis, or edema.  Skin no petechia, purpura or ecchymosis Neurologic is nonfocal.  Laboratory Data: White count 7.2, hemoglobin 12.0, hematocrit 36.9, platelets 157,000  Current Outpatient Prescriptions on File Prior to Visit  Medication Sig Dispense Refill  . alendronate (FOSAMAX) 35 MG tablet Take 1 tablet (35 mg total) by mouth every 7 (seven) days. Take in the morning with  a full  glass of water, on an empty stomach, and do not take anything else by mouth or lie down for the next 30 min.  90 tablet  0  . aspirin 81 MG tablet Take 81 mg by mouth daily.        Marland Kitchen atorvastatin (LIPITOR) 20 MG tablet Take 1 tablet (20 mg total) by mouth daily.  30 tablet  3  . carvedilol (COREG) 25 MG tablet Take 25 mg by mouth 2 (two) times daily. Takes if bp is over 120      . ciprofloxacin (CIPRO) 250 MG tablet Take 1 tablet (250 mg total) by mouth 2 (two) times daily.  10 tablet  0  . Diphenhydramine-APAP, sleep, (TYLENOL PM EXTRA STRENGTH PO) Take by mouth. As needed       . docusate sodium (COLACE) 100 MG capsule Take 100 mg by mouth 2 (two) times daily.        Marland Kitchen enoxaparin (LOVENOX) 80 MG/0.8ML injection Inject 0.8 mLs (80 mg total) into the skin daily.  10 Syringe  1  . fenofibrate 160 MG tablet Take 1 tablet (160 mg total) by mouth daily.  30 tablet  2  . folic acid (FOLVITE) 800 MCG tablet Take 400 mcg by mouth daily.        . furosemide (LASIX) 80 MG tablet Take 1 tablet (80 mg total) by mouth 2 (two) times daily.  180 tablet  1  . glipiZIDE (GLUCOTROL XL) 2.5 MG 24 hr tablet Take 1 tablet (2.5 mg total) by mouth daily.  30 tablet  3  . glucose blood (TRUETEST TEST) test strip Test once daily  100 each  3  . HYDROcodone-acetaminophen (NORCO/VICODIN) 5-325 MG per tablet Take 1 tablet by mouth every 8 (eight) hours as needed for pain. 3 times a day as needed  60 tablet  1  . isosorbide mononitrate (IMDUR) 30 MG 24 hr tablet Take 1 tablet (30 mg total) by mouth daily.  90 tablet  1  . lisinopril (PRINIVIL,ZESTRIL) 2.5 MG tablet Take 1 tablet (2.5 mg total) by mouth daily.  90 tablet  1  . methocarbamol (ROBAXIN) 500 MG tablet Take 1 tablet (500 mg total) by mouth 2 (two) times daily. As needed  180 tablet  0  . oxyCODONE-acetaminophen (PERCOCET/ROXICET) 5-325 MG per tablet Take 1 tablet by mouth every 4 (four) hours as needed.       . potassium chloride SA (K-DUR,KLOR-CON) 20 MEQ  tablet Take 1 tablet (20 mEq total) by mouth daily.  30 tablet  1  . spironolactone (ALDACTONE) 25 MG tablet Take 1 tablet (25 mg total) by mouth daily.  90 tablet  3  . warfarin (COUMADIN) 2.5 MG tablet Take as directed by coumadin clinic  35 tablet  3  . zoster vaccine live, PF, (ZOSTAVAX) 09811 UNT/0.65ML injection Inject 19,400 Units into the skin once.  1 each  0  . [DISCONTINUED] glyBURIDE (DIABETA) 5 MG tablet Take 1 tablet (5 mg total) by mouth 2 (two) times daily.  180 tablet  0   No current facility-administered medications on file prior to visit.   Assessment/Plan: This is a pleasant, 79 year old, white female, with the following issues:  #1.  Chronic microangiopathic hemolytic anemia.  She is well-controlled.  She remains on folic acid.  #2.  Intermittent iron deficiency anemia.  We are checking an iron panel on her today.  #3.  Followup.  We will follow back up with Kelsey Cochran in 6 months, but before then  should there be questions or concerns.

## 2013-03-22 ENCOUNTER — Telehealth: Payer: Self-pay | Admitting: *Deleted

## 2013-03-22 NOTE — Telephone Encounter (Signed)
Message copied by Anselm Jungling on Thu Mar 22, 2013 10:34 AM ------      Message from: Arlan Organ R      Created: Wed Mar 21, 2013  1:14 PM       Call - iron studies look great!!  Cindee Lame ------

## 2013-03-22 NOTE — Telephone Encounter (Signed)
Called patient to let her know that her iron studies look great

## 2013-03-29 ENCOUNTER — Other Ambulatory Visit: Payer: Self-pay | Admitting: Family Medicine

## 2013-03-29 NOTE — Telephone Encounter (Signed)
Rx sent to the pharmacy by e-script.//AB/CMA 

## 2013-04-05 ENCOUNTER — Other Ambulatory Visit: Payer: Self-pay | Admitting: General Practice

## 2013-04-05 ENCOUNTER — Ambulatory Visit (INDEPENDENT_AMBULATORY_CARE_PROVIDER_SITE_OTHER): Payer: Medicare PPO

## 2013-04-05 DIAGNOSIS — I059 Rheumatic mitral valve disease, unspecified: Secondary | ICD-10-CM

## 2013-04-05 DIAGNOSIS — Z9889 Other specified postprocedural states: Secondary | ICD-10-CM

## 2013-04-05 DIAGNOSIS — I4891 Unspecified atrial fibrillation: Secondary | ICD-10-CM

## 2013-04-05 MED ORDER — ATORVASTATIN CALCIUM 20 MG PO TABS: 20.0000 mg | ORAL_TABLET | Freq: Every day | ORAL | Status: DC

## 2013-04-05 NOTE — Telephone Encounter (Signed)
Med filled.  

## 2013-05-03 ENCOUNTER — Telehealth: Payer: Self-pay | Admitting: Cardiovascular Disease

## 2013-05-03 NOTE — Telephone Encounter (Signed)
New problem   Pt is unsure if she needs to come in to see dr nishan-offered to go ahead and schedule an appt but she wanted to speak to someone first to see how often she should be scheduling appts

## 2013-05-03 NOTE — Telephone Encounter (Signed)
SPOKE WITH PT  REVIEWED LAST OFFICE NOTE  PT  IS OVER DUE FOR APPT   WAS DUE TO COME IN  6 MONTHS  APPT MADE FOR  06-29-13 AT 2:45  WITH DR NISHAN./CY

## 2013-05-10 ENCOUNTER — Other Ambulatory Visit: Payer: Self-pay

## 2013-05-10 MED ORDER — WARFARIN SODIUM 2.5 MG PO TABS: ORAL_TABLET | ORAL | Status: DC

## 2013-05-11 ENCOUNTER — Other Ambulatory Visit: Payer: Self-pay | Admitting: Family Medicine

## 2013-05-17 ENCOUNTER — Ambulatory Visit (INDEPENDENT_AMBULATORY_CARE_PROVIDER_SITE_OTHER): Payer: Medicare PPO | Admitting: *Deleted

## 2013-05-17 DIAGNOSIS — I4891 Unspecified atrial fibrillation: Secondary | ICD-10-CM

## 2013-05-17 DIAGNOSIS — I059 Rheumatic mitral valve disease, unspecified: Secondary | ICD-10-CM

## 2013-05-17 DIAGNOSIS — Z9889 Other specified postprocedural states: Secondary | ICD-10-CM

## 2013-05-21 ENCOUNTER — Encounter: Payer: Self-pay | Admitting: Family Medicine

## 2013-05-21 ENCOUNTER — Ambulatory Visit (INDEPENDENT_AMBULATORY_CARE_PROVIDER_SITE_OTHER): Payer: Medicare PPO | Admitting: Family Medicine

## 2013-05-21 ENCOUNTER — Telehealth: Payer: Self-pay | Admitting: Family Medicine

## 2013-05-21 VITALS — BP 130/78 | HR 74 | Temp 98.1°F | Ht 64.25 in | Wt 157.8 lb

## 2013-05-21 DIAGNOSIS — IMO0002 Reserved for concepts with insufficient information to code with codable children: Secondary | ICD-10-CM

## 2013-05-21 DIAGNOSIS — M629 Disorder of muscle, unspecified: Secondary | ICD-10-CM

## 2013-05-21 DIAGNOSIS — R29898 Other symptoms and signs involving the musculoskeletal system: Secondary | ICD-10-CM

## 2013-05-21 DIAGNOSIS — E1165 Type 2 diabetes mellitus with hyperglycemia: Secondary | ICD-10-CM

## 2013-05-21 DIAGNOSIS — E7800 Pure hypercholesterolemia, unspecified: Secondary | ICD-10-CM

## 2013-05-21 DIAGNOSIS — E118 Type 2 diabetes mellitus with unspecified complications: Secondary | ICD-10-CM

## 2013-05-21 DIAGNOSIS — M2550 Pain in unspecified joint: Secondary | ICD-10-CM

## 2013-05-21 DIAGNOSIS — G5600 Carpal tunnel syndrome, unspecified upper limb: Secondary | ICD-10-CM

## 2013-05-21 MED ORDER — METHOCARBAMOL 500 MG PO TABS: 500.0000 mg | ORAL_TABLET | Freq: Two times a day (BID) | ORAL | Status: DC

## 2013-05-21 NOTE — Assessment & Plan Note (Signed)
Chronic problem.  Pt reports CBGs are well controlled.  UTD on eye exam.  Foot exam done today.  Check labs.  Adjust meds prn

## 2013-05-21 NOTE — Assessment & Plan Note (Signed)
Chronic problem.  Tolerating statin w/out difficulty.  Check labs.  Adjust meds prn  

## 2013-05-21 NOTE — Patient Instructions (Addendum)
Schedule your complete physical in 3-4 months We'll notify you of your lab results and make any changes if needed Start the Robaxin for joint pain/aches Make sure you mention your carpal tunnel to your surgeon Call with any questions or concerns Have a great summer!!!

## 2013-05-21 NOTE — Progress Notes (Signed)
  Subjective:    Patient ID: Kelsey Cochran, female    DOB: July 15, 1930, 79 y.o.   MRN: [id number]  HPI DM- chronic problem, on glipizide.  CBGs running 90-110s.  Denies symptomatic lows.  No CP, SOB above baseline, HAs.  UTD on eye exam.  No edema.  Reports when rubs over her feet- 'it feels like i have socks on'.  On gabapentin for carpal tunnel.  Hyperlipidemia- chronic problem, on Lipitor.  Denies abd pain, N/V, myalgias.  Carpal Tunnel- s/p surgery on R hand, now having sxs on L.  Joint pain- 'it's hard to get started some mornings'.  Did well w/ Robaxin.  Asking for refill.   Review of Systems For ROS see HPI     Objective:   Physical Exam  Constitutional: She is oriented to person, place, and time. She appears well-developed and well-nourished. No distress.  HENT:  Head: Normocephalic and atraumatic.  Eyes: Conjunctivae and EOM are normal. Pupils are equal, round, and reactive to light.  Neck: Normal range of motion. Neck supple. No thyromegaly present.  Cardiovascular: Normal rate, regular rhythm and intact distal pulses.   Mechanical click  Pulmonary/Chest: Effort normal and breath sounds normal. No respiratory distress.  Abdominal: Soft. She exhibits no distension. There is no tenderness.  Musculoskeletal: She exhibits no edema.  Lymphadenopathy:    She has no cervical adenopathy.  Neurological: She is alert and oriented to person, place, and time.  Skin: Skin is warm and dry.  Psychiatric: She has a normal mood and affect. Her behavior is normal.          Assessment & Plan:

## 2013-05-21 NOTE — Assessment & Plan Note (Signed)
Chronic problem.  Pt does well w/ low dose Vicodin and previously on Robaxin.  Will refill and follow.

## 2013-05-21 NOTE — Assessment & Plan Note (Signed)
Chronic problem.  Pt following w/ surgery for this.  Will follow along.

## 2013-05-21 NOTE — Telephone Encounter (Signed)
Patient came to check-out and showed me on her chart where it reads that she was a former smoker. She states that she was exposed to smoking as a child from her father and then from her former husband but has never smoked. She would like this changed.

## 2013-05-21 NOTE — Telephone Encounter (Signed)
Changed status to never a smoker.//AB/CMA

## 2013-05-22 ENCOUNTER — Other Ambulatory Visit: Payer: Medicare PPO

## 2013-05-22 LAB — LIPID PANEL
Cholesterol: 146 mg/dL (ref 0–200)
HDL: 38.1 mg/dL — ABNORMAL LOW
LDL Cholesterol: 84 mg/dL (ref 0–99)
Total CHOL/HDL Ratio: 4
Triglycerides: 120 mg/dL (ref 0.0–149.0)
VLDL: 24 mg/dL (ref 0.0–40.0)

## 2013-05-22 LAB — BASIC METABOLIC PANEL WITH GFR
BUN: 25 mg/dL — ABNORMAL HIGH (ref 6–23)
CO2: 27 meq/L (ref 19–32)
Calcium: 9.2 mg/dL (ref 8.4–10.5)
Chloride: 103 meq/L (ref 96–112)
Creatinine, Ser: 1.6 mg/dL — ABNORMAL HIGH (ref 0.4–1.2)
GFR: 34.14 mL/min — ABNORMAL LOW
Glucose, Bld: 125 mg/dL — ABNORMAL HIGH (ref 70–99)
Potassium: 3.1 meq/L — ABNORMAL LOW (ref 3.5–5.1)
Sodium: 142 meq/L (ref 135–145)

## 2013-05-22 LAB — HEPATIC FUNCTION PANEL
AST: 20 U/L (ref 0–37)
Albumin: 4.4 g/dL (ref 3.5–5.2)
Total Protein: 7.6 g/dL (ref 6.0–8.3)

## 2013-05-22 LAB — HEMOGLOBIN A1C: Hgb A1c MFr Bld: 7.1 % — ABNORMAL HIGH (ref 4.6–6.5)

## 2013-05-24 ENCOUNTER — Telehealth: Payer: Self-pay | Admitting: *Deleted

## 2013-05-24 ENCOUNTER — Other Ambulatory Visit: Payer: Self-pay | Admitting: Family Medicine

## 2013-05-24 NOTE — Telephone Encounter (Signed)
Refill done.  

## 2013-05-24 NOTE — Telephone Encounter (Signed)
Prior auth for methocarbamol 500mg  initiated. Form has been faxed back, awaiting approval.

## 2013-05-27 ENCOUNTER — Other Ambulatory Visit: Payer: Self-pay | Admitting: Family Medicine

## 2013-05-28 NOTE — Telephone Encounter (Signed)
Left message to call office

## 2013-05-28 NOTE — Telephone Encounter (Signed)
Pt can either pay out of pocket for generic med or switch to Zanaflex 2 mg TID prn, #60, 1 refill

## 2013-05-28 NOTE — Telephone Encounter (Signed)
Prior Auth denied under Medicare Part D benefit for the following prescription drugs.

## 2013-05-31 ENCOUNTER — Ambulatory Visit (INDEPENDENT_AMBULATORY_CARE_PROVIDER_SITE_OTHER): Payer: Medicare PPO | Admitting: *Deleted

## 2013-05-31 DIAGNOSIS — I4891 Unspecified atrial fibrillation: Secondary | ICD-10-CM

## 2013-05-31 DIAGNOSIS — Z9889 Other specified postprocedural states: Secondary | ICD-10-CM

## 2013-05-31 DIAGNOSIS — I059 Rheumatic mitral valve disease, unspecified: Secondary | ICD-10-CM

## 2013-05-31 LAB — POCT INR: INR: 3.8

## 2013-05-31 NOTE — Telephone Encounter (Signed)
Left message to call office

## 2013-05-31 NOTE — Telephone Encounter (Signed)
Discuss with patient who indicated that she paid for med out pocket. Pt advise that in the future if price becomes a issue to give [place] a call and we can switch med.

## 2013-06-08 ENCOUNTER — Encounter: Payer: Self-pay | Admitting: Family Medicine

## 2013-06-14 ENCOUNTER — Ambulatory Visit (INDEPENDENT_AMBULATORY_CARE_PROVIDER_SITE_OTHER): Payer: Medicare PPO | Admitting: *Deleted

## 2013-06-14 DIAGNOSIS — I059 Rheumatic mitral valve disease, unspecified: Secondary | ICD-10-CM

## 2013-06-14 DIAGNOSIS — Z9889 Other specified postprocedural states: Secondary | ICD-10-CM

## 2013-06-14 DIAGNOSIS — I4891 Unspecified atrial fibrillation: Secondary | ICD-10-CM

## 2013-06-20 ENCOUNTER — Other Ambulatory Visit: Payer: Self-pay | Admitting: *Deleted

## 2013-06-20 MED ORDER — SPIRONOLACTONE 25 MG PO TABS: 25.0000 mg | ORAL_TABLET | Freq: Every day | ORAL | Status: DC

## 2013-06-21 ENCOUNTER — Ambulatory Visit (INDEPENDENT_AMBULATORY_CARE_PROVIDER_SITE_OTHER): Payer: Medicare PPO | Admitting: *Deleted

## 2013-06-21 DIAGNOSIS — I4891 Unspecified atrial fibrillation: Secondary | ICD-10-CM

## 2013-06-21 DIAGNOSIS — Z9889 Other specified postprocedural states: Secondary | ICD-10-CM

## 2013-06-21 DIAGNOSIS — I059 Rheumatic mitral valve disease, unspecified: Secondary | ICD-10-CM

## 2013-06-21 LAB — POCT INR: INR: 3

## 2013-06-26 ENCOUNTER — Other Ambulatory Visit: Payer: Self-pay | Admitting: Family Medicine

## 2013-06-27 ENCOUNTER — Other Ambulatory Visit: Payer: Self-pay | Admitting: *Deleted

## 2013-06-27 NOTE — Telephone Encounter (Signed)
RF for fenofibrate approve and sent to pharmacy

## 2013-06-29 ENCOUNTER — Encounter: Payer: Self-pay | Admitting: Cardiovascular Disease

## 2013-06-29 ENCOUNTER — Ambulatory Visit (INDEPENDENT_AMBULATORY_CARE_PROVIDER_SITE_OTHER): Payer: Medicare PPO | Admitting: Cardiovascular Disease

## 2013-06-29 VITALS — BP 152/98 | HR 76 | Ht 64.25 in | Wt 158.8 lb

## 2013-06-29 DIAGNOSIS — I4891 Unspecified atrial fibrillation: Secondary | ICD-10-CM

## 2013-06-29 DIAGNOSIS — I059 Rheumatic mitral valve disease, unspecified: Secondary | ICD-10-CM

## 2013-06-29 DIAGNOSIS — I6529 Occlusion and stenosis of unspecified carotid artery: Secondary | ICD-10-CM

## 2013-06-29 NOTE — Patient Instructions (Signed)
Your physician wants you to follow-up in:  6 MONTHS WITH DR NISHAN  You will receive a reminder letter in the mail two months in advance. If you don't receive a letter, please call our office to schedule the follow-up appointment. Your physician recommends that you continue on your current medications as directed. Please refer to the Current Medication list given to you today. 

## 2013-06-29 NOTE — Progress Notes (Signed)
Patient ID: Kelsey Cochran, female   DOB: 07-21-1930, 79 y.o.   MRN: [id number] Kelsey Cochran is seen today for F/U of dyspnea, anticoagulaiton and MVR and carotid disease. Reviewed duplex from 9/13 and 60-79% RICA stenosis stable. . She has significant anemia with Hct 24 in past most recent 9/13 37 improved . I reviewed her echo from 3/8 and EF normal with normal functioning MVR no evidence of perivalvular leak or other abnormality that could contribute to anemia via shearing effect/hemolysis. She apparantly has had Aranasp shots and F/U with [name] but I don't have these notes. Tried on lisinopril for proteinuria but could not tolerate for light headedness. Encouraged her to F/U with [name] to find low dose substitute Recently put on fenofibrate for tryglycerides and INR elevated Following with coumadin clinic to adjust dose and better. Seen by [name] and with PA pressure in mid 50's presumed secondary to MV disease offerred cath and ? Rx for pulmonary hypertension but she declined.  Reviewed carotid duplex from 7/14  Stable 60-79% RICA  Echo 01/21/12 MVR ok  Study Conclusions  - Left ventricle: Systolic function was normal. The estimated ejection fraction was in the range of 60% to 65%. - Mitral valve: A mechanical prosthesis was present. Mean gradient: 3mm Hg (D). Peak gradient: 9mm Hg (D). - Pulmonary arteries: Systolic pressure was moderately increased. PA peak pressure: 56mm Hg (S).  . ROS: Denies fever, malais, weight loss, blurry vision, decreased visual acuity, cough, sputum, SOB, hemoptysis, pleuritic pain, palpitaitons, heartburn, abdominal pain, melena, lower extremity edema, claudication, or rash.  All other systems reviewed and negative  General: Affect appropriate Healthy:  appears stated age HEENT: normal Neck supple with no adenopathy JVP normal right  bruits no thyromegaly Lungs clear with no wheezing and good diaphragmatic motion Heart:  S1 click /S2 no murmur, no rub,  gallop or click PMI normal Abdomen: benighn, BS positve, no tenderness, no AAA no bruit.  No HSM or HJR Distal pulses intact with no bruits No edema Neuro non-focal Skin warm and dry No muscular weakness   Current Outpatient Prescriptions  Medication Sig Dispense Refill  . alendronate (FOSAMAX) 35 MG tablet TAKE  1  TABLET EVERY 7 DAYS IN THE MORNING AS DIRECTED. SEE PACKAGE FOR ADDITIONAL INSTRUCTIONS  12 tablet  3  . aspirin 81 MG tablet Take 81 mg by mouth daily.        Marland Kitchen atorvastatin (LIPITOR) 20 MG tablet Take 1 tablet (20 mg total) by mouth daily.  30 tablet  3  . carvedilol (COREG) 25 MG tablet Take 12.5 mg by mouth daily.       . Diphenhydramine-APAP, sleep, (TYLENOL PM EXTRA STRENGTH PO) Take by mouth. As needed       . docusate sodium (COLACE) 100 MG capsule Take 100 mg by mouth 2 (two) times daily.        . fenofibrate 160 MG tablet TAKE 1 TABLET BY MOUTH DAILY  30 tablet  0  . folic acid (FOLVITE) 800 MCG tablet Take 400 mcg by mouth daily.        . furosemide (LASIX) 80 MG tablet Take 1 tablet (80 mg total) by mouth 2 (two) times daily.  180 tablet  1  . glipiZIDE (GLUCOTROL XL) 2.5 MG 24 hr tablet TAKE 1 TABLET BY MOUTH DAILY  30 tablet  3  . glucose blood (TRUETEST TEST) test strip Test once daily  100 each  3  . HYDROcodone-acetaminophen (NORCO/VICODIN) 5-325 MG per  tablet Take 1 tablet by mouth as needed.      . isosorbide mononitrate (IMDUR) 30 MG 24 hr tablet Take 1 tablet (30 mg total) by mouth daily.  90 tablet  1  . methocarbamol (ROBAXIN) 500 MG tablet Take 1 tablet (500 mg total) by mouth 2 (two) times daily. As needed  60 tablet  3  . potassium chloride SA (K-DUR,KLOR-CON) 20 MEQ tablet TAKE 1 TABLET BY MOUTH DAILY  30 tablet  6  . spironolactone (ALDACTONE) 25 MG tablet Take 1 tablet (25 mg total) by mouth daily.  90 tablet  0  . warfarin (COUMADIN) 2.5 MG tablet Take as directed by coumadin clinic  35 tablet  3  . [DISCONTINUED] glyBURIDE (DIABETA) 5 MG tablet  Take 1 tablet (5 mg total) by mouth 2 (two) times daily.  180 tablet  0   No current facility-administered medications for this visit.    Allergies  Heparin; Meperidine hcl; and Sulfamethoxazole w-trimethoprim  Electrocardiogram:  afib RAD nonspecific ST/T wave changes 08/14/12  Assessment and Plan

## 2013-06-29 NOTE — Assessment & Plan Note (Signed)
No change in bruit Duplex in January when she sees me

## 2013-06-29 NOTE — Assessment & Plan Note (Signed)
Normal mechanical MV function SBE prophylaxis

## 2013-06-29 NOTE — Assessment & Plan Note (Signed)
Good rate control and anticoagulation  Will need lovenox bridge if coumadin stopped

## 2013-07-05 ENCOUNTER — Ambulatory Visit (INDEPENDENT_AMBULATORY_CARE_PROVIDER_SITE_OTHER): Payer: Medicare PPO | Admitting: *Deleted

## 2013-07-05 DIAGNOSIS — Z9889 Other specified postprocedural states: Secondary | ICD-10-CM

## 2013-07-05 DIAGNOSIS — I4891 Unspecified atrial fibrillation: Secondary | ICD-10-CM

## 2013-07-05 DIAGNOSIS — I059 Rheumatic mitral valve disease, unspecified: Secondary | ICD-10-CM

## 2013-07-05 LAB — POCT INR: INR: 4.2

## 2013-07-19 ENCOUNTER — Ambulatory Visit (INDEPENDENT_AMBULATORY_CARE_PROVIDER_SITE_OTHER): Payer: Medicare PPO

## 2013-07-19 DIAGNOSIS — I059 Rheumatic mitral valve disease, unspecified: Secondary | ICD-10-CM

## 2013-07-19 DIAGNOSIS — Z9889 Other specified postprocedural states: Secondary | ICD-10-CM

## 2013-07-19 DIAGNOSIS — I4891 Unspecified atrial fibrillation: Secondary | ICD-10-CM

## 2013-07-19 LAB — POCT INR: INR: 2.7

## 2013-07-20 ENCOUNTER — Other Ambulatory Visit: Payer: Self-pay

## 2013-07-20 MED ORDER — CARVEDILOL 25 MG PO TABS: 12.5000 mg | ORAL_TABLET | Freq: Every day | ORAL | Status: DC

## 2013-07-22 ENCOUNTER — Other Ambulatory Visit: Payer: Self-pay | Admitting: Family Medicine

## 2013-07-23 NOTE — Telephone Encounter (Signed)
Rx filled. SW, CMA 

## 2013-07-25 ENCOUNTER — Other Ambulatory Visit: Payer: Self-pay | Admitting: Family Medicine

## 2013-07-25 ENCOUNTER — Other Ambulatory Visit: Payer: Self-pay | Admitting: *Deleted

## 2013-07-25 MED ORDER — FUROSEMIDE 80 MG PO TABS: 80.0000 mg | ORAL_TABLET | Freq: Two times a day (BID) | ORAL | Status: DC

## 2013-08-06 ENCOUNTER — Encounter: Payer: Medicare PPO | Admitting: Family Medicine

## 2013-08-08 ENCOUNTER — Ambulatory Visit (INDEPENDENT_AMBULATORY_CARE_PROVIDER_SITE_OTHER): Payer: Medicare PPO | Admitting: *Deleted

## 2013-08-08 ENCOUNTER — Telehealth: Payer: Self-pay

## 2013-08-08 DIAGNOSIS — I059 Rheumatic mitral valve disease, unspecified: Secondary | ICD-10-CM

## 2013-08-08 DIAGNOSIS — Z9889 Other specified postprocedural states: Secondary | ICD-10-CM

## 2013-08-08 DIAGNOSIS — I4891 Unspecified atrial fibrillation: Secondary | ICD-10-CM

## 2013-08-08 NOTE — Telephone Encounter (Signed)
HM- UTD with exception Flu vaccine and colonoscopy that she will no longer get.  Meds reconciled, pharmacy and allergies reviewed.

## 2013-08-09 ENCOUNTER — Ambulatory Visit (INDEPENDENT_AMBULATORY_CARE_PROVIDER_SITE_OTHER): Payer: Medicare PPO | Admitting: Family Medicine

## 2013-08-09 ENCOUNTER — Encounter: Payer: Self-pay | Admitting: Family Medicine

## 2013-08-09 ENCOUNTER — Encounter: Payer: Medicare PPO | Admitting: Family Medicine

## 2013-08-09 VITALS — BP 122/78 | HR 71 | Temp 98.0°F | Ht 64.25 in | Wt 150.8 lb

## 2013-08-09 DIAGNOSIS — Z0000 Encounter for general adult medical examination without abnormal findings: Secondary | ICD-10-CM

## 2013-08-09 DIAGNOSIS — I10 Essential (primary) hypertension: Secondary | ICD-10-CM

## 2013-08-09 DIAGNOSIS — E7800 Pure hypercholesterolemia, unspecified: Secondary | ICD-10-CM

## 2013-08-09 DIAGNOSIS — E559 Vitamin D deficiency, unspecified: Secondary | ICD-10-CM

## 2013-08-09 DIAGNOSIS — E1165 Type 2 diabetes mellitus with hyperglycemia: Secondary | ICD-10-CM

## 2013-08-09 LAB — HEPATIC FUNCTION PANEL
ALT: 14 U/L (ref 0–35)
Bilirubin, Direct: 0.1 mg/dL (ref 0.0–0.3)
Total Bilirubin: 0.8 mg/dL (ref 0.3–1.2)

## 2013-08-09 LAB — BASIC METABOLIC PANEL
BUN: 23 mg/dL (ref 6–23)
Chloride: 104 mEq/L (ref 96–112)
Creatinine, Ser: 1.4 mg/dL — ABNORMAL HIGH (ref 0.4–1.2)
GFR: 38.37 mL/min — ABNORMAL LOW (ref >60.00)

## 2013-08-09 LAB — CBC WITH DIFFERENTIAL/PLATELET
Basophils Absolute: 0 10*3/uL (ref 0.0–0.1)
Eosinophils Absolute: 0.3 10*3/uL (ref 0.0–0.7)
Lymphocytes Relative: 11.3 % — ABNORMAL LOW (ref 12.0–46.0)
MCHC: 33.3 g/dL (ref 30.0–36.0)
MCV: 95.6 fl (ref 78.0–100.0)
Monocytes Absolute: 0.4 10*3/uL (ref 0.1–1.0)
Neutro Abs: 6.6 10*3/uL (ref 1.4–7.7)
Neutrophils Relative %: 80.3 % — ABNORMAL HIGH (ref 43.0–77.0)
RDW: 15.5 % — ABNORMAL HIGH (ref 11.5–14.6)

## 2013-08-09 LAB — LIPID PANEL
HDL: 46.2 mg/dL (ref >39.00)
Total CHOL/HDL Ratio: 4

## 2013-08-09 LAB — TSH: TSH: 1.51 u[IU]/mL (ref 0.35–5.50)

## 2013-08-09 MED ORDER — HYDROCODONE-ACETAMINOPHEN 5-325 MG PO TABS: 1.0000 | ORAL_TABLET | ORAL | Status: DC | PRN

## 2013-08-09 NOTE — Progress Notes (Signed)
  Subjective:    Patient ID: Kelsey Cochran, female    DOB: [birth year], 79 y.o.   MRN: [id number]  HPI Here today for CPE.  Risk Factors: DM- chronic problem, on Glipizide.  CBGs typically ~100 fasting.  Denies weakness/numbness hands or feet, no symptomatic lows.  UTD on eye exam (4/14).  No CP, SOB above baseline, HAs, visual changes, edema. HTN- chronic problem, on Coreg, Lasix, spironolactone, Imdur. Hyperlipidemia- chronic problem, on Lipitor and fenofibrate.  Denies abd pain, N/V, myalgias Physical Activity: limited due to arthritis Fall Risk: increased due to arthritis and use of cane Depression: denies current sxs Hearing: normal to conversational tones, mildly decreased to whispered voice ADL's: independent Cognitive: normal linear thought process, memory and attention intact. Home Safety: safe at home, good family support. Height, Weight, BMI, Visual Acuity: see vitals, vision corrected to 20/20 w/ glasses Counseling: UTD on DEXA, mammo.  No need for paps due to hysterectomy.  Pt declines colonoscopy. Labs Ordered: See A&P Care Plan: See A&P    Review of Systems Patient reports no vision/ hearing changes, adenopathy, fever, weight change,  persistant/recurrent hoarseness , swallowing issues, chest pain, palpitations, edema, persistant/recurrent cough, hemoptysis, dyspnea (rest/exertional/paroxysmal nocturnal), gastrointestinal bleeding (melena, rectal bleeding), abdominal pain, significant heartburn, bowel changes, GU symptoms (dysuria, hematuria, incontinence), Gyn symptoms (abnormal  bleeding, pain),  syncope, focal weakness, memory loss, numbness & tingling, skin/hair/nail changes, abnormal bruising or bleeding, anxiety, or depression.     Objective:   Physical Exam General Appearance:    Alert, cooperative, no distress, appears stated age  Head:    Normocephalic, without obvious abnormality, atraumatic  Eyes:    PERRL, conjunctiva/corneas clear, EOM's intact, fundi   benign, both eyes  Ears:    Normal TM's and external ear canals, both ears  Nose:   Nares normal, septum midline, mucosa normal, no drainage    or sinus tenderness  Throat:   Lips, mucosa, and tongue normal; teeth and gums normal  Neck:   Supple, symmetrical, trachea midline, no adenopathy;    Thyroid: no enlargement/tenderness/nodules  Back:     Symmetric, no curvature, ROM normal, no CVA tenderness  Lungs:     Clear to auscultation bilaterally, respirations unlabored  Chest Wall:    No tenderness or deformity   Heart:    Regular rate and rhythm, S1 and S2 normal, valvular click  Breast Exam:    Deferred to GYN  Abdomen:     Soft, non-tender, bowel sounds active all four quadrants,    no masses, no organomegaly  Genitalia:    Deferred to GYN  Rectal:    Extremities:   Extremities normal, atraumatic, no cyanosis or edema  Pulses:   2+ and symmetric all extremities  Skin:   Skin color, texture, turgor normal, no rashes or lesions  Lymph nodes:   Cervical, supraclavicular, and axillary nodes normal  Neurologic:   CNII-XII intact, normal strength, sensation and reflexes    throughout          Assessment & Plan:

## 2013-08-09 NOTE — Assessment & Plan Note (Signed)
Chronic problem.  Well controlled.  Asymptomatic.  Check labs.  No anticipated med changes. 

## 2013-08-09 NOTE — Patient Instructions (Addendum)
Follow up in 3-4 months to recheck diabetes We'll notify you of your lab results and make any changes if needed Keep up the good work!  You look great! Call with any questions or concerns Happy Early Birthday!!!

## 2013-08-09 NOTE — Assessment & Plan Note (Signed)
Chronic problem.  Tolerating meds w/out difficulty.  Check labs.  Adjust meds prn  

## 2013-08-09 NOTE — Assessment & Plan Note (Signed)
Pt's PE at baseline for her.  UTD on health maintenance w/ exception of colonoscopy which pt declines.  Getting flu shot at [facility].  Check labs.  Anticipatory guidance provided.

## 2013-08-09 NOTE — Assessment & Plan Note (Signed)
Check labs.  Replete prn. 

## 2013-08-09 NOTE — Assessment & Plan Note (Signed)
Chronic problem.  UTD on eye exam.  Foot exam done today.  CBGs typically well controlled.  Asymptomatic.  Check labs.  Adjust meds prn

## 2013-08-10 ENCOUNTER — Other Ambulatory Visit: Payer: Self-pay | Admitting: General Practice

## 2013-08-10 ENCOUNTER — Encounter: Payer: Self-pay | Admitting: *Deleted

## 2013-08-10 MED ORDER — HYDROCODONE-ACETAMINOPHEN 5-325 MG PO TABS: 1.0000 | ORAL_TABLET | Freq: Four times a day (QID) | ORAL | Status: DC | PRN

## 2013-08-14 ENCOUNTER — Encounter: Payer: Self-pay | Admitting: *Deleted

## 2013-08-14 ENCOUNTER — Other Ambulatory Visit: Payer: Self-pay | Admitting: General Practice

## 2013-08-14 LAB — VITAMIN D 1,25 DIHYDROXY
Vitamin D 1, 25 (OH)2 Total: 56 pg/mL (ref 18–72)
Vitamin D3 1, 25 (OH)2: 41 pg/mL

## 2013-08-14 MED ORDER — HYDROCODONE-ACETAMINOPHEN 5-325 MG PO TABS: 1.0000 | ORAL_TABLET | Freq: Four times a day (QID) | ORAL | Status: DC | PRN

## 2013-08-21 ENCOUNTER — Other Ambulatory Visit: Payer: Self-pay | Admitting: Family Medicine

## 2013-08-21 NOTE — Telephone Encounter (Signed)
Med filled.  

## 2013-08-22 ENCOUNTER — Encounter: Payer: Self-pay | Admitting: Family Medicine

## 2013-08-22 ENCOUNTER — Telehealth: Payer: Self-pay | Admitting: *Deleted

## 2013-08-22 NOTE — Telephone Encounter (Signed)
Pt called and stated that she isn't feeling well. She states that she had a fever last night 101.3 and this morning it was down to 99.0. Pt also states that she has chills. Pt vomited last night with small amount of blood in it. Advised patient that she needs to make an appointment if she if throwing up blood. Pt states that both of her daughters was at work today that she will try to see if she can get one of them to bring her here. Pt also states that she is feeling a little better than yesterday and that she would try to see if she would feel better before making an appointment

## 2013-08-24 ENCOUNTER — Other Ambulatory Visit: Payer: Self-pay | Admitting: Family Medicine

## 2013-08-24 ENCOUNTER — Ambulatory Visit (INDEPENDENT_AMBULATORY_CARE_PROVIDER_SITE_OTHER): Payer: Medicare PPO | Admitting: Family Medicine

## 2013-08-24 ENCOUNTER — Encounter: Payer: Self-pay | Admitting: Family Medicine

## 2013-08-24 VITALS — BP 124/72 | HR 80 | Temp 97.8°F | Resp 16 | Wt 151.0 lb

## 2013-08-24 DIAGNOSIS — R1110 Vomiting, unspecified: Secondary | ICD-10-CM | POA: Insufficient documentation

## 2013-08-24 DIAGNOSIS — D729 Disorder of white blood cells, unspecified: Secondary | ICD-10-CM

## 2013-08-24 LAB — CBC WITH DIFFERENTIAL/PLATELET
Basophils Relative: 0.1 % (ref 0.0–3.0)
Eosinophils Relative: 0.1 % (ref 0.0–5.0)
HCT: 33.2 % — ABNORMAL LOW (ref 36.0–46.0)
Lymphs Abs: 0.4 10*3/uL — ABNORMAL LOW (ref 0.7–4.0)
Monocytes Absolute: 0.3 10*3/uL (ref 0.1–1.0)
Monocytes Relative: 3.1 % (ref 3.0–12.0)
Neutro Abs: 9.4 10*3/uL — ABNORMAL HIGH (ref 1.4–7.7)
Neutrophils Relative %: 93 % — ABNORMAL HIGH (ref 43.0–77.0)
Platelets: 121 10*3/uL — ABNORMAL LOW (ref 150.0–400.0)
RBC: 3.5 Mil/uL — ABNORMAL LOW (ref 3.87–5.11)
WBC: 10.1 10*3/uL (ref 4.5–10.5)

## 2013-08-24 LAB — BASIC METABOLIC PANEL
CO2: 23 mEq/L (ref 19–32)
Chloride: 99 mEq/L (ref 96–112)
GFR: 33.37 mL/min — ABNORMAL LOW (ref >60.00)
Glucose, Bld: 187 mg/dL — ABNORMAL HIGH (ref 70–99)
Potassium: 3.5 mEq/L (ref 3.5–5.1)
Sodium: 139 mEq/L (ref 135–145)

## 2013-08-24 MED ORDER — PROMETHAZINE HCL 25 MG/ML IJ SOLN
25.0000 mg | Freq: Four times a day (QID) | INTRAMUSCULAR | Status: DC | PRN
  Administered 2013-08-24: 25 mg via INTRAVENOUS

## 2013-08-24 MED ORDER — PROMETHAZINE HCL 25 MG PO TABS: 25.0000 mg | ORAL_TABLET | Freq: Three times a day (TID) | ORAL | Status: DC | PRN

## 2013-08-24 MED ORDER — PROMETHAZINE HCL 25 MG RE SUPP: 25.0000 mg | Freq: Four times a day (QID) | RECTAL | Status: DC | PRN

## 2013-08-24 NOTE — Patient Instructions (Signed)
Start the phenergan as needed for nausea Drink plenty of fluids Eat as you are able We'll notify you of your lab results and make any changes if needed Go to the ER if you are unable to hold down fluids Call with any questions or concerns Hang in there!!!

## 2013-08-24 NOTE — Addendum Note (Signed)
Addended by: Jackson Latino on: 08/24/2013 01:57 PM   Modules accepted: Orders

## 2013-08-24 NOTE — Assessment & Plan Note (Signed)
New.  Suspect viral illness.  Check CBC given report of blood vomiting and also to r/o bacterial infxn.  Check electrolytes.  Phenergan prn.  Encouraged increased fluids.  Reviewed supportive care and red flags that should prompt return.  Pt expressed understanding and is in agreement w/ plan.

## 2013-08-24 NOTE — Progress Notes (Signed)
  Subjective:    Patient ID: Kelsey Cochran, female    DOB: Dec 31, 1929, 79 y.o.   MRN: [id number]  HPI Developed fever and chills on Tuesday.  Vomited x3- w/ blood in emesis basin.  Vomited again Wednesday- no BRB but some dark.  Vomited x2 yesterday- no blood.  Has not had diarrhea but had small, dark BM on Wednesday.  No vomiting this AM.  + nausea.  No fever today.  Had temp to 101 last night.  No body aches.  No cough.   Review of Systems For ROS see HPI     Objective:   Physical Exam  Vitals reviewed. Constitutional: She is oriented to person, place, and time. She appears well-developed and well-nourished. No distress.  HENT:  Head: Normocephalic and atraumatic.  MMM  Cardiovascular: Normal rate.   irreg S1/S2  Pulmonary/Chest: Effort normal and breath sounds normal. No respiratory distress. She has no wheezes. She has no rales.  Abdominal: Soft. Bowel sounds are normal. She exhibits no distension. There is no tenderness. There is no rebound and no guarding.  Neurological: She is alert and oriented to person, place, and time.  Skin: There is pallor.          Assessment & Plan:

## 2013-08-28 ENCOUNTER — Other Ambulatory Visit (INDEPENDENT_AMBULATORY_CARE_PROVIDER_SITE_OTHER): Payer: Medicare PPO

## 2013-08-28 DIAGNOSIS — D729 Disorder of white blood cells, unspecified: Secondary | ICD-10-CM

## 2013-08-28 DIAGNOSIS — D7289 Other specified disorders of white blood cells: Secondary | ICD-10-CM

## 2013-08-28 LAB — CBC WITH DIFFERENTIAL/PLATELET
Basophils Absolute: 0.1 10*3/uL (ref 0.0–0.1)
Basophils Relative: 1 % (ref 0.0–3.0)
Eosinophils Absolute: 0.3 10*3/uL (ref 0.0–0.7)
HCT: 31.6 % — ABNORMAL LOW (ref 36.0–46.0)
Hemoglobin: 10.5 g/dL — ABNORMAL LOW (ref 12.0–15.0)
Lymphocytes Relative: 9.5 % — ABNORMAL LOW (ref 12.0–46.0)
Lymphs Abs: 0.8 10*3/uL (ref 0.7–4.0)
MCHC: 33.3 g/dL (ref 30.0–36.0)
Monocytes Relative: 4.1 % (ref 3.0–12.0)
Neutro Abs: 7.3 10*3/uL (ref 1.4–7.7)
RBC: 3.3 Mil/uL — ABNORMAL LOW (ref 3.87–5.11)
RDW: 15.2 % — ABNORMAL HIGH (ref 11.5–14.6)

## 2013-08-29 ENCOUNTER — Other Ambulatory Visit: Payer: Self-pay | Admitting: Family Medicine

## 2013-08-29 ENCOUNTER — Encounter (HOSPITAL_COMMUNITY): Payer: Medicare PPO

## 2013-08-29 DIAGNOSIS — D649 Anemia, unspecified: Secondary | ICD-10-CM

## 2013-09-05 ENCOUNTER — Ambulatory Visit (HOSPITAL_COMMUNITY): Payer: Medicare PPO | Attending: Cardiovascular Disease

## 2013-09-05 DIAGNOSIS — E785 Hyperlipidemia, unspecified: Secondary | ICD-10-CM | POA: Insufficient documentation

## 2013-09-05 DIAGNOSIS — I10 Essential (primary) hypertension: Secondary | ICD-10-CM | POA: Insufficient documentation

## 2013-09-05 DIAGNOSIS — Z951 Presence of aortocoronary bypass graft: Secondary | ICD-10-CM | POA: Insufficient documentation

## 2013-09-05 DIAGNOSIS — E119 Type 2 diabetes mellitus without complications: Secondary | ICD-10-CM | POA: Insufficient documentation

## 2013-09-05 DIAGNOSIS — I658 Occlusion and stenosis of other precerebral arteries: Secondary | ICD-10-CM | POA: Insufficient documentation

## 2013-09-05 DIAGNOSIS — I6529 Occlusion and stenosis of unspecified carotid artery: Secondary | ICD-10-CM | POA: Insufficient documentation

## 2013-09-05 DIAGNOSIS — Z87891 Personal history of nicotine dependence: Secondary | ICD-10-CM | POA: Insufficient documentation

## 2013-09-06 ENCOUNTER — Ambulatory Visit (INDEPENDENT_AMBULATORY_CARE_PROVIDER_SITE_OTHER): Payer: Medicare PPO | Admitting: General Practice

## 2013-09-06 DIAGNOSIS — Z9889 Other specified postprocedural states: Secondary | ICD-10-CM

## 2013-09-06 DIAGNOSIS — I4891 Unspecified atrial fibrillation: Secondary | ICD-10-CM

## 2013-09-06 DIAGNOSIS — I059 Rheumatic mitral valve disease, unspecified: Secondary | ICD-10-CM

## 2013-09-06 LAB — PROTIME-INR
INR: 7.3 ratio (ref 0.8–1.0)
Prothrombin Time: 74.4 s (ref 10.2–12.4)

## 2013-09-10 ENCOUNTER — Other Ambulatory Visit (INDEPENDENT_AMBULATORY_CARE_PROVIDER_SITE_OTHER): Payer: Medicare PPO

## 2013-09-10 ENCOUNTER — Encounter (INDEPENDENT_AMBULATORY_CARE_PROVIDER_SITE_OTHER): Payer: Self-pay

## 2013-09-10 ENCOUNTER — Ambulatory Visit (INDEPENDENT_AMBULATORY_CARE_PROVIDER_SITE_OTHER): Payer: Medicare PPO | Admitting: General Practice

## 2013-09-10 DIAGNOSIS — D649 Anemia, unspecified: Secondary | ICD-10-CM

## 2013-09-10 DIAGNOSIS — I4891 Unspecified atrial fibrillation: Secondary | ICD-10-CM

## 2013-09-10 DIAGNOSIS — Z9889 Other specified postprocedural states: Secondary | ICD-10-CM

## 2013-09-10 DIAGNOSIS — I059 Rheumatic mitral valve disease, unspecified: Secondary | ICD-10-CM

## 2013-09-10 LAB — CBC WITH DIFFERENTIAL/PLATELET
Basophils Absolute: 0 10*3/uL (ref 0.0–0.1)
Basophils Relative: 0.2 % (ref 0.0–3.0)
Eosinophils Absolute: 0.2 10*3/uL (ref 0.0–0.7)
Eosinophils Relative: 2.2 % (ref 0.0–5.0)
HCT: 33.5 % — ABNORMAL LOW (ref 36.0–46.0)
Hemoglobin: 11.2 g/dL — ABNORMAL LOW (ref 12.0–15.0)
Lymphocytes Relative: 8.7 % — ABNORMAL LOW (ref 12.0–46.0)
Lymphs Abs: 0.9 10*3/uL (ref 0.7–4.0)
Monocytes Relative: 3.6 % (ref 3.0–12.0)
Neutro Abs: 9.2 10*3/uL — ABNORMAL HIGH (ref 1.4–7.7)
Neutrophils Relative %: 85.3 % — ABNORMAL HIGH (ref 43.0–77.0)
Platelets: 218 10*3/uL (ref 150.0–400.0)
RBC: 3.48 Mil/uL — ABNORMAL LOW (ref 3.87–5.11)
RDW: 15.5 % — ABNORMAL HIGH (ref 11.5–14.6)
WBC: 10.7 10*3/uL — ABNORMAL HIGH (ref 4.5–10.5)

## 2013-09-10 LAB — POCT INR: INR: 1.4

## 2013-09-12 ENCOUNTER — Other Ambulatory Visit: Payer: Medicare PPO

## 2013-09-14 ENCOUNTER — Other Ambulatory Visit: Payer: Self-pay

## 2013-09-14 MED ORDER — ISOSORBIDE MONONITRATE ER 30 MG PO TB24: 30.0000 mg | ORAL_TABLET | Freq: Every day | ORAL | Status: DC

## 2013-09-17 ENCOUNTER — Ambulatory Visit (INDEPENDENT_AMBULATORY_CARE_PROVIDER_SITE_OTHER): Payer: Medicare PPO | Admitting: Pharmacist

## 2013-09-17 DIAGNOSIS — I059 Rheumatic mitral valve disease, unspecified: Secondary | ICD-10-CM

## 2013-09-17 DIAGNOSIS — I4891 Unspecified atrial fibrillation: Secondary | ICD-10-CM

## 2013-09-17 DIAGNOSIS — Z9889 Other specified postprocedural states: Secondary | ICD-10-CM

## 2013-09-17 LAB — POCT INR: INR: 1.2

## 2013-09-18 ENCOUNTER — Other Ambulatory Visit: Payer: Self-pay

## 2013-09-18 MED ORDER — SPIRONOLACTONE 25 MG PO TABS: 25.0000 mg | ORAL_TABLET | Freq: Every day | ORAL | Status: DC

## 2013-09-20 ENCOUNTER — Ambulatory Visit: Payer: Medicare PPO | Admitting: Hematology & Oncology

## 2013-09-20 ENCOUNTER — Other Ambulatory Visit: Payer: Medicare PPO | Admitting: Lab

## 2013-09-20 ENCOUNTER — Other Ambulatory Visit: Payer: Self-pay

## 2013-09-20 MED ORDER — ISOSORBIDE MONONITRATE ER 30 MG PO TB24: 30.0000 mg | ORAL_TABLET | Freq: Every day | ORAL | Status: DC

## 2013-09-20 MED ORDER — SPIRONOLACTONE 25 MG PO TABS: 25.0000 mg | ORAL_TABLET | Freq: Every day | ORAL | Status: DC

## 2013-09-21 ENCOUNTER — Other Ambulatory Visit (HOSPITAL_BASED_OUTPATIENT_CLINIC_OR_DEPARTMENT_OTHER): Payer: Medicare PPO | Admitting: Lab

## 2013-09-21 ENCOUNTER — Ambulatory Visit (INDEPENDENT_AMBULATORY_CARE_PROVIDER_SITE_OTHER): Payer: Medicare PPO | Admitting: Pharmacist

## 2013-09-21 ENCOUNTER — Ambulatory Visit (HOSPITAL_BASED_OUTPATIENT_CLINIC_OR_DEPARTMENT_OTHER): Payer: Medicare PPO | Admitting: Hematology & Oncology

## 2013-09-21 VITALS — BP 131/63 | HR 75 | Temp 97.7°F | Resp 14 | Ht 62.0 in | Wt 155.0 lb

## 2013-09-21 DIAGNOSIS — D509 Iron deficiency anemia, unspecified: Secondary | ICD-10-CM

## 2013-09-21 DIAGNOSIS — D594 Other nonautoimmune hemolytic anemias: Secondary | ICD-10-CM

## 2013-09-21 DIAGNOSIS — Z954 Presence of other heart-valve replacement: Secondary | ICD-10-CM

## 2013-09-21 DIAGNOSIS — I4891 Unspecified atrial fibrillation: Secondary | ICD-10-CM

## 2013-09-21 DIAGNOSIS — I059 Rheumatic mitral valve disease, unspecified: Secondary | ICD-10-CM

## 2013-09-21 DIAGNOSIS — Z9889 Other specified postprocedural states: Secondary | ICD-10-CM

## 2013-09-21 DIAGNOSIS — K922 Gastrointestinal hemorrhage, unspecified: Secondary | ICD-10-CM

## 2013-09-21 DIAGNOSIS — D649 Anemia, unspecified: Secondary | ICD-10-CM

## 2013-09-21 DIAGNOSIS — Z7901 Long term (current) use of anticoagulants: Secondary | ICD-10-CM

## 2013-09-21 DIAGNOSIS — D500 Iron deficiency anemia secondary to blood loss (chronic): Secondary | ICD-10-CM

## 2013-09-21 LAB — CBC WITH DIFFERENTIAL (CANCER CENTER ONLY)
BASO%: 0.5 % (ref 0.0–2.0)
EOS%: 4.7 % (ref 0.0–7.0)
Eosinophils Absolute: 0.3 10*3/uL (ref 0.0–0.5)
HCT: 34.6 % — ABNORMAL LOW (ref 34.8–46.6)
LYMPH#: 0.9 10*3/uL (ref 0.9–3.3)
LYMPH%: 15.1 % (ref 14.0–48.0)
MCHC: 32.1 g/dL (ref 32.0–36.0)
NEUT%: 73.3 % (ref 39.6–80.0)
Platelets: 164 10*3/uL (ref 145–400)
RDW: 14.9 % (ref 11.1–15.7)
WBC: 6.2 10*3/uL (ref 3.9–10.0)

## 2013-09-21 LAB — IRON AND TIBC CHCC
%SAT: 32 % (ref 21–57)
TIBC: 280 ug/dL (ref 236–444)
UIBC: 189 ug/dL (ref 120–384)

## 2013-09-21 LAB — CHCC SATELLITE - SMEAR

## 2013-09-21 LAB — FERRITIN CHCC: Ferritin: 2068 ng/ml — ABNORMAL HIGH (ref 9–269)

## 2013-09-21 NOTE — Progress Notes (Signed)
This office note has been dictated.

## 2013-09-23 NOTE — Progress Notes (Signed)
CC:   Kelsey Cochran, M.D. [name]. Eden Emms, MD, [facility]  DIAGNOSES: 1. 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 2. Intermittent gastrointestinal bleeding secondary to chronic     Coumadin. 3. Intermittent iron-deficiency anemia secondary to bleeding.  CURRENT THERAPY: 1. Folic acid 1 mg p.o. daily. 2. IV iron as indicated. 3. Chronic Coumadin -- managed with our Coumadin Clinic.  INTERIM HISTORY:  Ms. Burck comes in for followup.  She had an episode of upper GI bleeding about a month or so ago.  She says she had a "nasty" bug.  This was a GI issue.  She says that she has some hematemesis.  She saw Dr. Beverely Low.  Dr. Beverely Low did not have to put her in the hospital which Ms. Gutzwiller was very thankful for.  She is getting her Coumadin levels checked closely by the Coumadin Clinic.  She has had no fever.  There has been no cough or shortness of breath. She has had no dysuria.  There has been no abdominal pain.  She has had no leg swelling.  PHYSICAL EXAMINATION:  General:  This is an elderly white female in no obvious distress.  Vital Signs:  Her temperature 97.7, pulse 75, respiratory rate 14, blood pressure 131/63, weight is 155 pounds.  Head and neck exam shows a normocephalic, atraumatic skull.  There are no ocular or oral lesions.  There is no scleral icterus.  Conjunctivae are pink.  She has no adenopathy in the neck.  Lungs are clear bilaterally. Cardiac:  Regular rate and rhythm.  She has a systolic click.  Abdomen is soft.  She has good bowel sounds.  There is no fluid wave.  There is no palpable hepatosplenomegaly.  Extremities show some trace chronic edema in the lower legs.  She has good strength in her legs.  She has good range of motion of her joints.  She has some osteoarthritic changes in her joints.  Skin exam shows no ecchymoses or petechia.  Neurological exam shows no focal neurological deficits.  LABORATORY STUDIES:  White cell  count is 6.2, hemoglobin 11.1, hematocrit 34.6, platelet count 164.  MCV is 100.  IMPRESSION:  Ms. Lajeunesse is a very charming 79 year old white female with chronic microangiopathic hemolysis.  She is compensated for this.  She is on folic acid.  She does have intermittent bleeding.  This does cause iron deficiency. We will see what her iron studies look like.  It would not surprise me if they are little on the low side.  Her last iron studies showed a ferritin of 1557.  Iron saturation was 32%.  I want to get her back to see me in about 4 months now.  I just want to make sure that we stay on top of the anemia.    ______________________________ Josph Macho, M.D. PRE/MEDQ  D:  09/21/2013  T:  09/22/2013  Job:  [id number]

## 2013-09-28 ENCOUNTER — Ambulatory Visit (INDEPENDENT_AMBULATORY_CARE_PROVIDER_SITE_OTHER): Payer: Medicare PPO | Admitting: *Deleted

## 2013-09-28 DIAGNOSIS — Z9889 Other specified postprocedural states: Secondary | ICD-10-CM

## 2013-09-28 DIAGNOSIS — I059 Rheumatic mitral valve disease, unspecified: Secondary | ICD-10-CM

## 2013-09-28 DIAGNOSIS — I4891 Unspecified atrial fibrillation: Secondary | ICD-10-CM

## 2013-09-28 LAB — POCT INR: INR: 2

## 2013-10-08 ENCOUNTER — Ambulatory Visit (INDEPENDENT_AMBULATORY_CARE_PROVIDER_SITE_OTHER): Payer: Medicare PPO | Admitting: General Practice

## 2013-10-08 DIAGNOSIS — Z9889 Other specified postprocedural states: Secondary | ICD-10-CM

## 2013-10-08 DIAGNOSIS — I4891 Unspecified atrial fibrillation: Secondary | ICD-10-CM

## 2013-10-08 DIAGNOSIS — I059 Rheumatic mitral valve disease, unspecified: Secondary | ICD-10-CM

## 2013-10-08 LAB — POCT INR: INR: 2

## 2013-10-14 ENCOUNTER — Other Ambulatory Visit: Payer: Self-pay | Admitting: Cardiovascular Disease

## 2013-10-24 ENCOUNTER — Ambulatory Visit (INDEPENDENT_AMBULATORY_CARE_PROVIDER_SITE_OTHER): Payer: Medicare PPO | Admitting: Pharmacist

## 2013-10-24 DIAGNOSIS — Z9889 Other specified postprocedural states: Secondary | ICD-10-CM

## 2013-10-24 DIAGNOSIS — I4891 Unspecified atrial fibrillation: Secondary | ICD-10-CM

## 2013-10-24 DIAGNOSIS — I059 Rheumatic mitral valve disease, unspecified: Secondary | ICD-10-CM

## 2013-11-16 ENCOUNTER — Ambulatory Visit (INDEPENDENT_AMBULATORY_CARE_PROVIDER_SITE_OTHER): Payer: Medicare PPO | Admitting: *Deleted

## 2013-11-16 DIAGNOSIS — Z9889 Other specified postprocedural states: Secondary | ICD-10-CM

## 2013-11-16 DIAGNOSIS — I059 Rheumatic mitral valve disease, unspecified: Secondary | ICD-10-CM

## 2013-11-16 DIAGNOSIS — I4891 Unspecified atrial fibrillation: Secondary | ICD-10-CM

## 2013-11-16 LAB — POCT INR: INR: 2.1

## 2013-11-22 ENCOUNTER — Ambulatory Visit: Payer: Medicare PPO | Admitting: Family Medicine

## 2013-11-26 ENCOUNTER — Encounter: Payer: Self-pay | Admitting: Family Medicine

## 2013-11-26 ENCOUNTER — Other Ambulatory Visit: Payer: Self-pay | Admitting: General Practice

## 2013-11-26 ENCOUNTER — Ambulatory Visit (INDEPENDENT_AMBULATORY_CARE_PROVIDER_SITE_OTHER): Payer: Medicare PPO | Admitting: Family Medicine

## 2013-11-26 VITALS — BP 118/76 | HR 70 | Temp 98.2°F | Resp 16 | Wt 148.4 lb

## 2013-11-26 DIAGNOSIS — IMO0002 Reserved for concepts with insufficient information to code with codable children: Secondary | ICD-10-CM

## 2013-11-26 DIAGNOSIS — R29898 Other symptoms and signs involving the musculoskeletal system: Secondary | ICD-10-CM

## 2013-11-26 DIAGNOSIS — M629 Disorder of muscle, unspecified: Secondary | ICD-10-CM

## 2013-11-26 DIAGNOSIS — E118 Type 2 diabetes mellitus with unspecified complications: Principal | ICD-10-CM

## 2013-11-26 DIAGNOSIS — E1165 Type 2 diabetes mellitus with hyperglycemia: Secondary | ICD-10-CM

## 2013-11-26 MED ORDER — HYDROCODONE-ACETAMINOPHEN 5-325 MG PO TABS: 1.0000 | ORAL_TABLET | Freq: Four times a day (QID) | ORAL | Status: DC | PRN

## 2013-11-26 MED ORDER — METHOCARBAMOL 500 MG PO TABS: 500.0000 mg | ORAL_TABLET | Freq: Two times a day (BID) | ORAL | Status: DC

## 2013-11-26 NOTE — Patient Instructions (Signed)
Follow up in 3-4 months to recheck sugar and cholesterol (fasting) We'll notify you of your lab results and make any changes if needed Keep up the good work!  You look great! Call with any questions or concerns Happy New Year!!!

## 2013-11-26 NOTE — Progress Notes (Signed)
   Subjective:    Patient ID: Kelsey Cochran, female    DOB: 02/28/1930, 80 y.o.   MRN: [id number]  HPI DM- chronic problem, on Glipizide.  CBG this AM 93, ranging 90-120s.  Was intolerant to ACE b/c this caused dizziness.  Has eye exam upcoming in April.  Denies CP, SOB above baseline, no N/V/D.   Review of Systems For ROS see HPI     Objective:   Physical Exam  Vitals reviewed. Constitutional: She is oriented to person, place, and time. She appears well-developed and well-nourished. No distress.  HENT:  Head: Normocephalic and atraumatic.  Eyes: Conjunctivae and EOM are normal. Pupils are equal, round, and reactive to light.  Neck: Normal range of motion. Neck supple. No thyromegaly present.  Cardiovascular: Normal rate, regular rhythm, normal heart sounds and intact distal pulses.   No murmur heard. Pulmonary/Chest: Effort normal and breath sounds normal. No respiratory distress.  Abdominal: Soft. She exhibits no distension. There is no tenderness.  Musculoskeletal: She exhibits no edema.  Lymphadenopathy:    She has no cervical adenopathy.  Neurological: She is alert and oriented to person, place, and time.  Skin: Skin is warm and dry.  Psychiatric: She has a normal mood and affect. Her behavior is normal.          Assessment & Plan:

## 2013-11-26 NOTE — Progress Notes (Signed)
Pre visit review using our clinic review tool, if applicable. No additional management support is needed unless otherwise documented below in the visit note. 

## 2013-11-26 NOTE — Assessment & Plan Note (Signed)
Chronic problem.  Typically well controlled.  UTD on eye exam.  Asymptomatic.  Check labs.  Adjust meds prn  

## 2013-11-27 ENCOUNTER — Telehealth: Payer: Self-pay

## 2013-11-27 ENCOUNTER — Encounter: Payer: Self-pay | Admitting: General Practice

## 2013-11-27 LAB — BASIC METABOLIC PANEL
BUN: 24 mg/dL — AB (ref 6–23)
CALCIUM: 9 mg/dL (ref 8.4–10.5)
CHLORIDE: 101 meq/L (ref 96–112)
CO2: 26 mEq/L (ref 19–32)
CREATININE: 1.4 mg/dL — AB (ref 0.4–1.2)
GFR: 37.41 mL/min — AB (ref >60.00)
Glucose, Bld: 95 mg/dL (ref 70–99)
Potassium: 3.6 mEq/L (ref 3.5–5.1)
Sodium: 138 mEq/L (ref 135–145)

## 2013-11-27 LAB — HEMOGLOBIN A1C: HEMOGLOBIN A1C: 6.3 % (ref 4.6–6.5)

## 2013-11-27 NOTE — Telephone Encounter (Signed)
Relevant patient education mailed to patient.  

## 2013-11-29 ENCOUNTER — Ambulatory Visit (INDEPENDENT_AMBULATORY_CARE_PROVIDER_SITE_OTHER): Payer: Medicare PPO

## 2013-11-29 DIAGNOSIS — I059 Rheumatic mitral valve disease, unspecified: Secondary | ICD-10-CM

## 2013-11-29 DIAGNOSIS — I4891 Unspecified atrial fibrillation: Secondary | ICD-10-CM

## 2013-11-29 DIAGNOSIS — Z9889 Other specified postprocedural states: Secondary | ICD-10-CM

## 2013-11-29 LAB — POCT INR: INR: 2.8

## 2013-12-06 ENCOUNTER — Other Ambulatory Visit: Payer: Self-pay

## 2013-12-06 MED ORDER — FUROSEMIDE 80 MG PO TABS: 80.0000 mg | ORAL_TABLET | Freq: Two times a day (BID) | ORAL | Status: DC

## 2013-12-07 ENCOUNTER — Telehealth: Payer: Self-pay | Admitting: Interventional Cardiology

## 2013-12-07 NOTE — Telephone Encounter (Signed)
SPOKE WITH  PT,  STATES WAS CALLED  EARLIER  TO  CHANGE TIME  OF APPT ON 01-07-13  TO  EARLIER TIME  AS  DR Eden EmmsNISHAN ONLY HERE  QUARTER OF  DAY NOT HALF  PT  OKAY  WITH  8:30 IF NEEDS TO MAY  CALL BACK  FOR DIFFER DAY BECAUSE  DAUGHTER  BRINGS  PT  AND  DOES NOT  CARE FOR EARLY APPTS .Kelsey Cochran/CY

## 2013-12-07 NOTE — Telephone Encounter (Signed)
Wynona CanesChristine this is a pt of Dr. Ricki MillerNishans. I cant find any results for her other than her PT/INR lab recently.

## 2013-12-07 NOTE — Telephone Encounter (Signed)
New Message  Pt called for results// Please call//SR

## 2013-12-18 ENCOUNTER — Other Ambulatory Visit: Payer: Self-pay | Admitting: Family Medicine

## 2013-12-18 NOTE — Telephone Encounter (Signed)
Med filled.  

## 2013-12-20 ENCOUNTER — Ambulatory Visit (INDEPENDENT_AMBULATORY_CARE_PROVIDER_SITE_OTHER): Payer: Medicare PPO | Admitting: *Deleted

## 2013-12-20 DIAGNOSIS — I4891 Unspecified atrial fibrillation: Secondary | ICD-10-CM

## 2013-12-20 DIAGNOSIS — I059 Rheumatic mitral valve disease, unspecified: Secondary | ICD-10-CM

## 2013-12-20 DIAGNOSIS — Z5181 Encounter for therapeutic drug level monitoring: Secondary | ICD-10-CM | POA: Insufficient documentation

## 2013-12-20 DIAGNOSIS — Z9889 Other specified postprocedural states: Secondary | ICD-10-CM

## 2013-12-20 LAB — POCT INR: INR: 2.3

## 2014-01-07 ENCOUNTER — Ambulatory Visit (INDEPENDENT_AMBULATORY_CARE_PROVIDER_SITE_OTHER): Payer: Medicare PPO | Admitting: Cardiovascular Disease

## 2014-01-07 ENCOUNTER — Ambulatory Visit (INDEPENDENT_AMBULATORY_CARE_PROVIDER_SITE_OTHER): Payer: Medicare PPO

## 2014-01-07 VITALS — BP 144/66 | HR 65 | Wt 153.0 lb

## 2014-01-07 DIAGNOSIS — Z9889 Other specified postprocedural states: Secondary | ICD-10-CM

## 2014-01-07 DIAGNOSIS — Z5181 Encounter for therapeutic drug level monitoring: Secondary | ICD-10-CM

## 2014-01-07 DIAGNOSIS — I059 Rheumatic mitral valve disease, unspecified: Secondary | ICD-10-CM

## 2014-01-07 DIAGNOSIS — I10 Essential (primary) hypertension: Secondary | ICD-10-CM

## 2014-01-07 DIAGNOSIS — I4891 Unspecified atrial fibrillation: Secondary | ICD-10-CM

## 2014-01-07 DIAGNOSIS — I6529 Occlusion and stenosis of unspecified carotid artery: Secondary | ICD-10-CM

## 2014-01-07 LAB — POCT INR: INR: 3

## 2014-01-07 NOTE — Patient Instructions (Signed)
Your physician wants you to follow-up in:   6 MONTHS  WITH  DR Haywood FillerNISHAN  You will receive a reminder letter in the mail two months in advance. If you don't receive a letter, please call our office to schedule the follow-up appointment. Your physician recommends that you continue on your current medications as directed. Please refer to the Current Medication list given to you today. Your physician has requested that you have a carotid duplex. This test is an ultrasound of the carotid arteries in your neck. It looks at blood flow through these arteries that supply the brain with blood. Allow one hour for this exam. There are no restrictions or special instructions. DUE IN  APRIL

## 2014-01-07 NOTE — Assessment & Plan Note (Signed)
Well controlled.  Continue current medications and low sodium Dash type diet.    

## 2014-01-07 NOTE — Assessment & Plan Note (Signed)
F/U duplex April known 60-79% RICA

## 2014-01-07 NOTE — Progress Notes (Signed)
Patient ID: Kelsey Cochran, female   DOB: 06-27-1930, 80 y.o.   MRN: [id number] Kelsey Cochran is seen today for F/U of dyspnea, anticoagulaiton and MVR and carotid disease. Reviewed duplex from 9/13 and 60-79% RICA stenosis stable. . She has significant anemia with Hct 24 in past most recent 9/13 37 improved . I reviewed her echo from 3/8 and EF normal with normal functioning MVR no evidence of perivalvular leak or other abnormality that could contribute to anemia via shearing effect/hemolysis. She apparantly has had Aranasp shots and F/U with [name] but I don't have these notes. Tried on lisinopril for proteinuria but could not tolerate for light headedness. Encouraged her to F/U with [name] to find low dose substitute Recently put on fenofibrate for tryglycerides and INR elevated Following with coumadin clinic to adjust dose and better. Seen by [name] and with PA pressure in mid 50's presumed secondary to MV disease offerred cath and ? Rx for pulmonary hypertension but she declined.  Reviewed carotid duplex from 7/14 Stable 60-79% RICA  Echo 01/21/12 MVR ok  Study Conclusions  - Left ventricle: Systolic function was normal. The estimated ejection fraction was in the range of 60% to 65%. - Mitral valve: A mechanical prosthesis was present. Mean gradient: 3mm Hg (D). Peak gradient: 9mm Hg (D). - Pulmonary arteries: Systolic pressure was moderately increased. PA peak pressure: 56mm Hg (S).      ROS: Denies fever, malais, weight loss, blurry vision, decreased visual acuity, cough, sputum, SOB, hemoptysis, pleuritic pain, palpitaitons, heartburn, abdominal pain, melena, lower extremity edema, claudication, or rash.  All other systems reviewed and negative  General: Affect appropriate Healthy:  appears stated age HEENT: normal Neck supple with no adenopathy JVP normal no bruits no thyromegaly Lungs clear with no wheezing and good diaphragmatic motion Heart:  S1 prominent /S2 no murmur, no rub,  gallop or click PMI normal Abdomen: benighn, BS positve, no tenderness, no AAA no bruit.  No HSM or HJR Distal pulses intact with no bruits No edema varicose veings  Neuro non-focal Skin warm and dry No muscular weakness   Current Outpatient Prescriptions  Medication Sig Dispense Refill  . alendronate (FOSAMAX) 35 MG tablet TAKE  1  TABLET EVERY 7 DAYS IN THE MORNING AS DIRECTED. SEE PACKAGE FOR ADDITIONAL INSTRUCTIONS  12 tablet  3  . aspirin 81 MG tablet Take 81 mg by mouth daily.        Marland Kitchen atorvastatin (LIPITOR) 20 MG tablet TAKE 1 TABLET BY MOUTH EVERY DAY  30 tablet  3  . carvedilol (COREG) 25 MG tablet Take 0.5 tablets (12.5 mg total) by mouth daily.  15 tablet  60  . Diphenhydramine-APAP, sleep, (TYLENOL PM EXTRA STRENGTH PO) Take by mouth. As needed       . docusate sodium (COLACE) 100 MG capsule Take 100 mg by mouth 2 (two) times daily.        . fenofibrate 160 MG tablet TAKE 1 TABLET BY MOUTH DAILY  30 tablet  5  . folic acid (FOLVITE) 800 MCG tablet Take 400 mcg by mouth daily.        . furosemide (LASIX) 80 MG tablet Take 1 tablet (80 mg total) by mouth 2 (two) times daily.  180 tablet  1  . glipiZIDE (GLUCOTROL XL) 2.5 MG 24 hr tablet TAKE 1 TABLET BY MOUTH EVERY DAY  30 tablet  5  . HYDROcodone-acetaminophen (NORCO/VICODIN) 5-325 MG per tablet Take 1 tablet by mouth every 6 (six) hours  as needed.  90 tablet  0  . isosorbide mononitrate (IMDUR) 30 MG 24 hr tablet Take 1 tablet (30 mg total) by mouth daily.  90 tablet  3  . methocarbamol (ROBAXIN) 500 MG tablet Take 1 tablet (500 mg total) by mouth 2 (two) times daily. As needed  60 tablet  3  . potassium chloride SA (K-DUR,KLOR-CON) 20 MEQ tablet TAKE 1 TABLET BY MOUTH DAILY  30 tablet  6  . promethazine (PHENERGAN) 25 MG suppository Place 1 suppository (25 mg total) rectally every 6 (six) hours as needed for nausea.  12 each  0  . promethazine (PHENERGAN) 25 MG tablet Take 1 tablet (25 mg total) by mouth every 8 (eight) hours  as needed for nausea.  30 tablet  0  . spironolactone (ALDACTONE) 25 MG tablet Take 1 tablet (25 mg total) by mouth daily.  90 tablet  3  . TRUETEST TEST test strip TEST ONCE DAILY  100 each  3  . warfarin (COUMADIN) 2.5 MG tablet TAKE AS DIRECTED BY COUMADIN CLINIC  35 tablet  3  . [DISCONTINUED] glyBURIDE (DIABETA) 5 MG tablet Take 1 tablet (5 mg total) by mouth 2 (two) times daily.  180 tablet  0   No current facility-administered medications for this visit.    Allergies  Heparin; Meperidine hcl; and Sulfamethoxazole-trimethoprim  Electrocardiogram:  SR rate 65 RAD low voltage   Assessment and Plan

## 2014-01-07 NOTE — Assessment & Plan Note (Signed)
Normal valve sounds  F/U coumadin clinic  SBE prophylaxis

## 2014-01-10 ENCOUNTER — Other Ambulatory Visit: Payer: Self-pay | Admitting: Family Medicine

## 2014-01-11 NOTE — Telephone Encounter (Signed)
Med filled.  

## 2014-01-15 ENCOUNTER — Telehealth: Payer: Self-pay | Admitting: Family Medicine

## 2014-01-15 NOTE — Telephone Encounter (Signed)
Patient called stating she needs new rx for fenofibrate sent to [facility] because she can get it for free.

## 2014-01-16 MED ORDER — FENOFIBRATE 160 MG PO TABS: ORAL_TABLET | ORAL | Status: DC

## 2014-01-16 NOTE — Telephone Encounter (Signed)
Med filled.  

## 2014-01-17 ENCOUNTER — Telehealth: Payer: Self-pay | Admitting: Hematology & Oncology

## 2014-01-17 NOTE — Telephone Encounter (Signed)
Pt moved 3-6 to 3-17

## 2014-01-18 ENCOUNTER — Ambulatory Visit: Payer: Medicare PPO | Admitting: Hematology & Oncology

## 2014-01-18 ENCOUNTER — Other Ambulatory Visit: Payer: Self-pay

## 2014-01-18 ENCOUNTER — Other Ambulatory Visit: Payer: Medicare PPO | Admitting: Lab

## 2014-01-18 MED ORDER — CARVEDILOL 25 MG PO TABS: 12.5000 mg | ORAL_TABLET | Freq: Every day | ORAL | Status: DC

## 2014-01-25 ENCOUNTER — Other Ambulatory Visit: Payer: Self-pay | Admitting: Family Medicine

## 2014-01-25 NOTE — Telephone Encounter (Signed)
Med filled.  

## 2014-01-29 ENCOUNTER — Other Ambulatory Visit (HOSPITAL_BASED_OUTPATIENT_CLINIC_OR_DEPARTMENT_OTHER): Payer: Medicare PPO | Admitting: Lab

## 2014-01-29 ENCOUNTER — Ambulatory Visit (HOSPITAL_BASED_OUTPATIENT_CLINIC_OR_DEPARTMENT_OTHER): Payer: Medicare PPO | Admitting: Hematology & Oncology

## 2014-01-29 ENCOUNTER — Encounter: Payer: Self-pay | Admitting: Hematology & Oncology

## 2014-01-29 VITALS — BP 145/63 | HR 63 | Temp 97.9°F | Resp 18 | Wt 168.0 lb

## 2014-01-29 DIAGNOSIS — D649 Anemia, unspecified: Secondary | ICD-10-CM

## 2014-01-29 DIAGNOSIS — D500 Iron deficiency anemia secondary to blood loss (chronic): Secondary | ICD-10-CM

## 2014-01-29 DIAGNOSIS — D594 Other nonautoimmune hemolytic anemias: Secondary | ICD-10-CM

## 2014-01-29 DIAGNOSIS — T82897A Other specified complication of cardiac prosthetic devices, implants and grafts, initial encounter: Secondary | ICD-10-CM

## 2014-01-29 LAB — RETICULOCYTES (CHCC)
ABS Retic: 52.5 10*3/uL (ref 19.0–186.0)
RBC.: 3.75 MIL/uL — ABNORMAL LOW (ref 3.87–5.11)
RETIC CT PCT: 1.4 % (ref 0.4–2.3)

## 2014-01-29 LAB — CBC WITH DIFFERENTIAL (CANCER CENTER ONLY)
BASO#: 0 10*3/uL (ref 0.0–0.2)
BASO%: 0.7 % (ref 0.0–2.0)
EOS ABS: 0.3 10*3/uL (ref 0.0–0.5)
EOS%: 5.4 % (ref 0.0–7.0)
HCT: 36.6 % (ref 34.8–46.6)
HGB: 11.8 g/dL (ref 11.6–15.9)
LYMPH#: 0.9 10*3/uL (ref 0.9–3.3)
LYMPH%: 14.4 % (ref 14.0–48.0)
MCH: 31.9 pg (ref 26.0–34.0)
MCHC: 32.2 g/dL (ref 32.0–36.0)
MCV: 99 fL (ref 81–101)
MONO#: 0.4 10*3/uL (ref 0.1–0.9)
MONO%: 6.9 % (ref 0.0–13.0)
NEUT#: 4.3 10*3/uL (ref 1.5–6.5)
NEUT%: 72.6 % (ref 39.6–80.0)
PLATELETS: 149 10*3/uL (ref 145–400)
RBC: 3.7 10*6/uL (ref 3.70–5.32)
RDW: 14 % (ref 11.1–15.7)
WBC: 5.9 10*3/uL (ref 3.9–10.0)

## 2014-01-29 LAB — IRON AND TIBC CHCC
%SAT: 22 % (ref 21–57)
Iron: 74 ug/dL (ref 41–142)
TIBC: 333 ug/dL (ref 236–444)
UIBC: 258 ug/dL (ref 120–384)

## 2014-01-29 LAB — CHCC SATELLITE - SMEAR

## 2014-01-29 LAB — FERRITIN CHCC

## 2014-01-29 NOTE — Progress Notes (Signed)
CC:   Neena RhymesKatherine Tabori, M.D. Noralyn PickPeter C. Eden EmmsNishan, MD, [facility]  DIAGNOSES: 1. 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 2. Intermittent gastrointestinal bleeding secondary to chronic     Coumadin. 3. Intermittent iron-deficiency anemia secondary to bleeding.  CURRENT THERAPY: 1. Folic acid 1 mg p.o. daily. 2. IV iron as indicated. 3. Chronic Coumadin -- managed with our Coumadin Clinic.  INTERIM HISTORY:  Ms. Manson PasseyBrown comes in for followup. She is doing very well. We last saw her back in November. She's had no problems over the holidays. She's had no bleeding. To do well with her heart. No she's had no infections. She's had no cough or shortness of breath. She's had no nausea vomiting.   She is getting her Coumadin levels checked closely by the Coumadin Clinic.  She has had no fever.  She has had no dysuria.  There has been no abdominal pain.  She has had no leg swelling.  Lower last saw her, her ferritin was 2000. Iron saturation was 32%. PHYSICAL EXAMINATION:  General:  This is an elderly white female in no obvious distress.  Vital Signs:  Her temperature 98.7, pulse 65, respiratory rate 14, blood pressure 154/63, weight is 168 pounds.  Head and neck exam shows a normocephalic, atraumatic skull.  There are no ocular or oral lesions.  There is no scleral icterus.  Conjunctivae are pink.  She has no adenopathy in the neck.  Lungs are clear bilaterally. Cardiac:  Regular rate and rhythm.  She has a systolic click.  Abdomen is soft.  She has good bowel sounds.  There is no fluid wave.  There is no palpable hepatosplenomegaly.  Extremities show some trace chronic edema in the lower legs.  She has good strength in her legs.  She has good range of motion of her joints.  She has some osteoarthritic changes in her joints.  Skin exam shows no ecchymoses or petechia.  Neurological exam shows no focal neurological deficits.  LABORATORY STUDIES:  White cell count is  7.4, hemoglobin 11.8, hematocrit 36, platelet count 174.  MCV is 100.  IMPRESSION:  Ms. Manson PasseyBrown is a very charming 80 year old white female with chronic microangiopathic hemolysis.  She is compensated for this.  She is on folic acid.  For now, there is a looks fantastic. I don't see any issues with respect to iron deficiency. Her hemoglobin is holding nice and stable.  I think that loss she does not bleeding, she should be indicated.  I think that because she is doing well, we can get her back in 6 months now.

## 2014-01-31 ENCOUNTER — Telehealth: Payer: Self-pay | Admitting: *Deleted

## 2014-01-31 ENCOUNTER — Ambulatory Visit (INDEPENDENT_AMBULATORY_CARE_PROVIDER_SITE_OTHER): Payer: Medicare PPO | Admitting: *Deleted

## 2014-01-31 DIAGNOSIS — Z5181 Encounter for therapeutic drug level monitoring: Secondary | ICD-10-CM

## 2014-01-31 DIAGNOSIS — I4891 Unspecified atrial fibrillation: Secondary | ICD-10-CM

## 2014-01-31 DIAGNOSIS — I059 Rheumatic mitral valve disease, unspecified: Secondary | ICD-10-CM

## 2014-01-31 DIAGNOSIS — Z9889 Other specified postprocedural states: Secondary | ICD-10-CM

## 2014-01-31 LAB — POCT INR: INR: 3.3

## 2014-01-31 NOTE — Telephone Encounter (Signed)
Message copied by Anselm JunglingBARTKO, Cyniah Gossard ELLEN O on Thu Jan 31, 2014  4:17 PM ------      Message from: Arlan OrganENNEVER, PETER R      Created: Thu Jan 31, 2014  6:45 AM       cll - iron is ok!!  peet ------

## 2014-01-31 NOTE — Telephone Encounter (Signed)
Called patient to let her know that her iron levels looked ok

## 2014-02-10 ENCOUNTER — Other Ambulatory Visit: Payer: Self-pay | Admitting: Cardiovascular Disease

## 2014-02-27 ENCOUNTER — Ambulatory Visit (INDEPENDENT_AMBULATORY_CARE_PROVIDER_SITE_OTHER): Payer: Medicare PPO | Admitting: *Deleted

## 2014-02-27 DIAGNOSIS — Z5181 Encounter for therapeutic drug level monitoring: Secondary | ICD-10-CM

## 2014-02-27 DIAGNOSIS — I059 Rheumatic mitral valve disease, unspecified: Secondary | ICD-10-CM

## 2014-02-27 DIAGNOSIS — Z9889 Other specified postprocedural states: Secondary | ICD-10-CM

## 2014-02-27 DIAGNOSIS — I4891 Unspecified atrial fibrillation: Secondary | ICD-10-CM

## 2014-02-27 LAB — POCT INR: INR: 3

## 2014-02-28 ENCOUNTER — Encounter: Payer: Self-pay | Admitting: Lab

## 2014-02-28 ENCOUNTER — Ambulatory Visit (INDEPENDENT_AMBULATORY_CARE_PROVIDER_SITE_OTHER): Payer: Medicare PPO | Admitting: Family Medicine

## 2014-02-28 ENCOUNTER — Encounter: Payer: Self-pay | Admitting: General Practice

## 2014-02-28 ENCOUNTER — Encounter: Payer: Self-pay | Admitting: Family Medicine

## 2014-02-28 VITALS — BP 138/68 | HR 66 | Temp 98.0°F | Resp 16 | Wt 157.1 lb

## 2014-02-28 DIAGNOSIS — I10 Essential (primary) hypertension: Secondary | ICD-10-CM

## 2014-02-28 DIAGNOSIS — IMO0002 Reserved for concepts with insufficient information to code with codable children: Secondary | ICD-10-CM

## 2014-02-28 DIAGNOSIS — E1165 Type 2 diabetes mellitus with hyperglycemia: Secondary | ICD-10-CM

## 2014-02-28 DIAGNOSIS — E7800 Pure hypercholesterolemia, unspecified: Secondary | ICD-10-CM

## 2014-02-28 DIAGNOSIS — E118 Type 2 diabetes mellitus with unspecified complications: Secondary | ICD-10-CM

## 2014-02-28 LAB — BASIC METABOLIC PANEL
BUN: 20 mg/dL (ref 6–23)
CHLORIDE: 99 meq/L (ref 96–112)
CO2: 25 mEq/L (ref 19–32)
CREATININE: 1.5 mg/dL — AB (ref 0.4–1.2)
Calcium: 9.3 mg/dL (ref 8.4–10.5)
GFR: 34.33 mL/min — ABNORMAL LOW (ref >60.00)
Glucose, Bld: 111 mg/dL — ABNORMAL HIGH (ref 70–99)
Potassium: 3.5 mEq/L (ref 3.5–5.1)
Sodium: 137 mEq/L (ref 135–145)

## 2014-02-28 LAB — CBC WITH DIFFERENTIAL/PLATELET
Basophils Absolute: 0 10*3/uL (ref 0.0–0.1)
Basophils Relative: 0.4 % (ref 0.0–3.0)
EOS PCT: 5.4 % — AB (ref 0.0–5.0)
Eosinophils Absolute: 0.3 10*3/uL (ref 0.0–0.7)
HCT: 36.2 % (ref 36.0–46.0)
Hemoglobin: 12.2 g/dL (ref 12.0–15.0)
LYMPHS PCT: 13.7 % (ref 12.0–46.0)
Lymphs Abs: 0.8 10*3/uL (ref 0.7–4.0)
MCHC: 33.8 g/dL (ref 30.0–36.0)
MCV: 96 fl (ref 78.0–100.0)
MONO ABS: 0.3 10*3/uL (ref 0.1–1.0)
Monocytes Relative: 5.5 % (ref 3.0–12.0)
Neutro Abs: 4.5 10*3/uL (ref 1.4–7.7)
Neutrophils Relative %: 75 % (ref 43.0–77.0)
PLATELETS: 150 10*3/uL (ref 150.0–400.0)
RBC: 3.77 Mil/uL — ABNORMAL LOW (ref 3.87–5.11)
RDW: 14.7 % — ABNORMAL HIGH (ref 11.5–14.6)
WBC: 6 10*3/uL (ref 4.5–10.5)

## 2014-02-28 LAB — LIPID PANEL
Cholesterol: 166 mg/dL (ref 0–200)
HDL: 42.7 mg/dL (ref >39.00)
LDL Cholesterol: 101 mg/dL — ABNORMAL HIGH (ref 0–99)
TRIGLYCERIDES: 111 mg/dL (ref 0.0–149.0)
Total CHOL/HDL Ratio: 4
VLDL: 22.2 mg/dL (ref 0.0–40.0)

## 2014-02-28 LAB — HEPATIC FUNCTION PANEL
ALBUMIN: 4.3 g/dL (ref 3.5–5.2)
ALT: 13 U/L (ref 0–35)
AST: 25 U/L (ref 0–37)
Alkaline Phosphatase: 37 U/L — ABNORMAL LOW (ref 39–117)
BILIRUBIN TOTAL: 1.5 mg/dL — AB (ref 0.3–1.2)
Bilirubin, Direct: 0.3 mg/dL (ref 0.0–0.3)
Total Protein: 7.5 g/dL (ref 6.0–8.3)

## 2014-02-28 LAB — HEMOGLOBIN A1C: Hgb A1c MFr Bld: 6.4 % (ref 4.6–6.5)

## 2014-02-28 NOTE — Progress Notes (Signed)
   Subjective:    Patient ID: Kelsey Cochran, female    DOB: 02/20/1930, 80 y.o.   MRN: [id number]  HPI DM- chronic problem, on Glipizide.  Denies symptomatic lows.  UTD on eye exam- has appt upcoming.  No N/V/D.  CBGs 91 this AM, 109 yesterday.  Remaining under 120.    Hyperlipidemia- chronic problem, on Lipitor, Fenofibrate.  Denies abd pain, N/V.  HTN- chronic problem, adequate control today on Spironolactone, Lasix, Imdur, Coreg.  No CP, SOB above baseline, HAs, visual changes, edema.   Review of Systems For ROS see HPI     Objective:   Physical Exam  Vitals reviewed. Constitutional: She is oriented to person, place, and time. She appears well-developed and well-nourished. No distress.  HENT:  Head: Normocephalic and atraumatic.  Eyes: Conjunctivae and EOM are normal. Pupils are equal, round, and reactive to light.  Neck: Normal range of motion. Neck supple. No thyromegaly present.  Cardiovascular: Normal rate, regular rhythm, normal heart sounds and intact distal pulses.   No murmur heard. Pulmonary/Chest: Effort normal and breath sounds normal. No respiratory distress.  Abdominal: Soft. She exhibits no distension. There is no tenderness.  Musculoskeletal: She exhibits no edema.  Lymphadenopathy:    She has no cervical adenopathy.  Neurological: She is alert and oriented to person, place, and time.  Skin: Skin is warm and dry.  Psychiatric: She has a normal mood and affect. Her behavior is normal.          Assessment & Plan:

## 2014-02-28 NOTE — Assessment & Plan Note (Signed)
Chronic problem, CBGs well controlled.  UTD on eye exam.  Check labs.  Adjust meds prn

## 2014-02-28 NOTE — Assessment & Plan Note (Signed)
Chronic problem, adequate control.  Asymptomatic.  No changes. 

## 2014-02-28 NOTE — Patient Instructions (Signed)
Follow up in 3-4 months to recheck sugar We'll notify you of your lab results and make any changes if needed Keep up the good work! Call with any questions or concerns [place]!!!

## 2014-02-28 NOTE — Progress Notes (Signed)
Pre visit review using our clinic review tool, if applicable. No additional management support is needed unless otherwise documented below in the visit note. 

## 2014-02-28 NOTE — Assessment & Plan Note (Signed)
Chronic problem.  Tolerating statin w/o difficulty.  Check labs.  Adjust meds prn  

## 2014-03-07 ENCOUNTER — Ambulatory Visit (HOSPITAL_COMMUNITY): Payer: Medicare PPO | Attending: Internal Medicine | Admitting: Cardiology

## 2014-03-07 DIAGNOSIS — I6529 Occlusion and stenosis of unspecified carotid artery: Secondary | ICD-10-CM | POA: Insufficient documentation

## 2014-03-07 NOTE — Progress Notes (Signed)
Carotid duplex complete 

## 2014-03-11 LAB — HM DIABETES EYE EXAM

## 2014-03-26 ENCOUNTER — Encounter: Payer: Self-pay | Admitting: Family Medicine

## 2014-03-27 ENCOUNTER — Encounter: Payer: Self-pay | Admitting: General Practice

## 2014-03-28 ENCOUNTER — Ambulatory Visit (INDEPENDENT_AMBULATORY_CARE_PROVIDER_SITE_OTHER): Payer: Medicare PPO | Admitting: *Deleted

## 2014-03-28 DIAGNOSIS — I059 Rheumatic mitral valve disease, unspecified: Secondary | ICD-10-CM

## 2014-03-28 DIAGNOSIS — Z5181 Encounter for therapeutic drug level monitoring: Secondary | ICD-10-CM

## 2014-03-28 DIAGNOSIS — I4891 Unspecified atrial fibrillation: Secondary | ICD-10-CM

## 2014-03-28 DIAGNOSIS — Z9889 Other specified postprocedural states: Secondary | ICD-10-CM

## 2014-03-28 LAB — POCT INR: INR: 5.2

## 2014-04-04 ENCOUNTER — Ambulatory Visit (INDEPENDENT_AMBULATORY_CARE_PROVIDER_SITE_OTHER): Payer: Medicare PPO | Admitting: *Deleted

## 2014-04-04 DIAGNOSIS — Z5181 Encounter for therapeutic drug level monitoring: Secondary | ICD-10-CM

## 2014-04-04 DIAGNOSIS — I4891 Unspecified atrial fibrillation: Secondary | ICD-10-CM

## 2014-04-04 DIAGNOSIS — I059 Rheumatic mitral valve disease, unspecified: Secondary | ICD-10-CM

## 2014-04-04 DIAGNOSIS — Z9889 Other specified postprocedural states: Secondary | ICD-10-CM

## 2014-04-04 LAB — POCT INR: INR: 2.2

## 2014-04-05 ENCOUNTER — Other Ambulatory Visit: Payer: Self-pay | Admitting: General Practice

## 2014-04-05 MED ORDER — ATORVASTATIN CALCIUM 20 MG PO TABS: ORAL_TABLET | ORAL | Status: DC

## 2014-04-09 LAB — HM DEXA SCAN: HM DEXA SCAN: BORDERLINE

## 2014-04-09 LAB — HM MAMMOGRAPHY: HM Mammogram: NORMAL

## 2014-04-18 ENCOUNTER — Encounter: Payer: Self-pay | Admitting: General Practice

## 2014-04-18 ENCOUNTER — Ambulatory Visit (INDEPENDENT_AMBULATORY_CARE_PROVIDER_SITE_OTHER): Payer: Medicare PPO

## 2014-04-18 DIAGNOSIS — I059 Rheumatic mitral valve disease, unspecified: Secondary | ICD-10-CM

## 2014-04-18 DIAGNOSIS — Z9889 Other specified postprocedural states: Secondary | ICD-10-CM

## 2014-04-18 DIAGNOSIS — Z5181 Encounter for therapeutic drug level monitoring: Secondary | ICD-10-CM

## 2014-04-18 DIAGNOSIS — I4891 Unspecified atrial fibrillation: Secondary | ICD-10-CM

## 2014-04-18 LAB — POCT INR: INR: 2.7

## 2014-05-01 ENCOUNTER — Encounter: Payer: Self-pay | Admitting: Family Medicine

## 2014-05-01 ENCOUNTER — Other Ambulatory Visit: Payer: Self-pay | Admitting: Family Medicine

## 2014-05-01 NOTE — Telephone Encounter (Signed)
Med filled.  

## 2014-05-09 ENCOUNTER — Ambulatory Visit (INDEPENDENT_AMBULATORY_CARE_PROVIDER_SITE_OTHER): Payer: Medicare PPO | Admitting: *Deleted

## 2014-05-09 DIAGNOSIS — Z5181 Encounter for therapeutic drug level monitoring: Secondary | ICD-10-CM

## 2014-05-09 DIAGNOSIS — I059 Rheumatic mitral valve disease, unspecified: Secondary | ICD-10-CM

## 2014-05-09 DIAGNOSIS — I4891 Unspecified atrial fibrillation: Secondary | ICD-10-CM

## 2014-05-09 DIAGNOSIS — Z9889 Other specified postprocedural states: Secondary | ICD-10-CM

## 2014-05-09 LAB — POCT INR: INR: 3.3

## 2014-05-29 ENCOUNTER — Other Ambulatory Visit: Payer: Self-pay | Admitting: Family Medicine

## 2014-05-29 NOTE — Telephone Encounter (Signed)
Med filled.  

## 2014-05-30 ENCOUNTER — Ambulatory Visit (INDEPENDENT_AMBULATORY_CARE_PROVIDER_SITE_OTHER): Payer: Medicare PPO | Admitting: Family Medicine

## 2014-05-30 ENCOUNTER — Encounter: Payer: Self-pay | Admitting: Family Medicine

## 2014-05-30 VITALS — BP 130/74 | HR 62 | Temp 97.6°F | Resp 16 | Wt 156.5 lb

## 2014-05-30 DIAGNOSIS — M25552 Pain in left hip: Secondary | ICD-10-CM

## 2014-05-30 DIAGNOSIS — E118 Type 2 diabetes mellitus with unspecified complications: Principal | ICD-10-CM

## 2014-05-30 DIAGNOSIS — IMO0002 Reserved for concepts with insufficient information to code with codable children: Secondary | ICD-10-CM

## 2014-05-30 DIAGNOSIS — E1165 Type 2 diabetes mellitus with hyperglycemia: Secondary | ICD-10-CM

## 2014-05-30 DIAGNOSIS — M25559 Pain in unspecified hip: Secondary | ICD-10-CM

## 2014-05-30 LAB — BASIC METABOLIC PANEL
BUN: 29 mg/dL — ABNORMAL HIGH (ref 6–23)
CALCIUM: 9.2 mg/dL (ref 8.4–10.5)
CO2: 27 mEq/L (ref 19–32)
Chloride: 102 mEq/L (ref 96–112)
Creatinine, Ser: 1.8 mg/dL — ABNORMAL HIGH (ref 0.4–1.2)
GFR: 29.41 mL/min — AB (ref >60.00)
Glucose, Bld: 106 mg/dL — ABNORMAL HIGH (ref 70–99)
Potassium: 3.7 mEq/L (ref 3.5–5.1)
SODIUM: 141 meq/L (ref 135–145)

## 2014-05-30 LAB — HEMOGLOBIN A1C: Hgb A1c MFr Bld: 6.4 % (ref 4.6–6.5)

## 2014-05-30 MED ORDER — HYDROCODONE-ACETAMINOPHEN 5-325 MG PO TABS: 1.0000 | ORAL_TABLET | Freq: Four times a day (QID) | ORAL | Status: DC | PRN

## 2014-05-30 NOTE — Progress Notes (Signed)
Pre visit review using our clinic review tool, if applicable. No additional management support is needed unless otherwise documented below in the visit note. 

## 2014-05-30 NOTE — Patient Instructions (Signed)
Schedule your complete physical in 3-4 months We'll notify you of your lab results and make any changes if needed Keep up the good work!  You look great! Call with any questions or concerns Enjoy the beach!!!

## 2014-05-30 NOTE — Progress Notes (Signed)
   Subjective:    Patient ID: Kelsey Cochran, female    DOB: 08/08/1930, 80 y.o.   MRN: [id number]  HPI DM- chronic problem, on Glipizide.  CBG average 89 for this month.  Intolerant to ACE/ARB.  UTD on eye exam.  BP well controlled.  No CP, SOB above baseline, HAs, visual changes, edema, N/V.  Hip pain- chronic problem, on vicodin, needs refill.  Review of Systems For ROS see HPI     Objective:   Physical Exam  Vitals reviewed. Constitutional: She is oriented to person, place, and time. She appears well-developed and well-nourished. No distress.  HENT:  Head: Normocephalic and atraumatic.  Eyes: Conjunctivae and EOM are normal. Pupils are equal, round, and reactive to light.  Neck: Normal range of motion. Neck supple. No thyromegaly present.  Cardiovascular: Normal rate, regular rhythm and intact distal pulses.   + mitral valve click  Pulmonary/Chest: Effort normal and breath sounds normal. No respiratory distress.  Abdominal: Soft. She exhibits no distension. There is no tenderness.  Musculoskeletal: She exhibits no edema.  Lymphadenopathy:    She has no cervical adenopathy.  Neurological: She is alert and oriented to person, place, and time.  Skin: Skin is warm and dry.  Psychiatric: She has a normal mood and affect. Her behavior is normal.          Assessment & Plan:

## 2014-05-30 NOTE — Assessment & Plan Note (Signed)
Chronic problem.  UTD on eye exam.  Foot exam done today.  Intolerant to ACE/ARB.  Excellent CBG control.  Asymptomatic.  Check labs.  Adjust meds prn

## 2014-05-30 NOTE — Assessment & Plan Note (Signed)
Chronic problem. Refill on Vicodin provided.

## 2014-05-31 ENCOUNTER — Encounter: Payer: Self-pay | Admitting: General Practice

## 2014-06-06 ENCOUNTER — Ambulatory Visit (INDEPENDENT_AMBULATORY_CARE_PROVIDER_SITE_OTHER): Payer: Medicare PPO | Admitting: *Deleted

## 2014-06-06 ENCOUNTER — Other Ambulatory Visit: Payer: Self-pay

## 2014-06-06 DIAGNOSIS — I4891 Unspecified atrial fibrillation: Secondary | ICD-10-CM

## 2014-06-06 DIAGNOSIS — Z5181 Encounter for therapeutic drug level monitoring: Secondary | ICD-10-CM

## 2014-06-06 DIAGNOSIS — I059 Rheumatic mitral valve disease, unspecified: Secondary | ICD-10-CM

## 2014-06-06 DIAGNOSIS — Z9889 Other specified postprocedural states: Secondary | ICD-10-CM

## 2014-06-06 LAB — POCT INR: INR: 4.3

## 2014-06-06 MED ORDER — FUROSEMIDE 80 MG PO TABS: 80.0000 mg | ORAL_TABLET | Freq: Two times a day (BID) | ORAL | Status: DC

## 2014-06-19 ENCOUNTER — Ambulatory Visit (INDEPENDENT_AMBULATORY_CARE_PROVIDER_SITE_OTHER): Payer: Medicare PPO | Admitting: Pharmacist

## 2014-06-19 DIAGNOSIS — Z9889 Other specified postprocedural states: Secondary | ICD-10-CM

## 2014-06-19 DIAGNOSIS — I059 Rheumatic mitral valve disease, unspecified: Secondary | ICD-10-CM

## 2014-06-19 DIAGNOSIS — Z5181 Encounter for therapeutic drug level monitoring: Secondary | ICD-10-CM

## 2014-06-19 DIAGNOSIS — I4891 Unspecified atrial fibrillation: Secondary | ICD-10-CM

## 2014-06-19 LAB — POCT INR: INR: 3.7

## 2014-06-24 ENCOUNTER — Other Ambulatory Visit: Payer: Self-pay | Admitting: Cardiovascular Disease

## 2014-06-26 ENCOUNTER — Other Ambulatory Visit (INDEPENDENT_AMBULATORY_CARE_PROVIDER_SITE_OTHER): Payer: Medicare PPO

## 2014-06-26 ENCOUNTER — Encounter: Payer: Self-pay | Admitting: General Practice

## 2014-06-26 DIAGNOSIS — R748 Abnormal levels of other serum enzymes: Secondary | ICD-10-CM

## 2014-06-26 LAB — BASIC METABOLIC PANEL
BUN: 25 mg/dL — ABNORMAL HIGH (ref 6–23)
CALCIUM: 8.9 mg/dL (ref 8.4–10.5)
CO2: 24 mEq/L (ref 19–32)
Chloride: 103 mEq/L (ref 96–112)
Creatinine, Ser: 1.5 mg/dL — ABNORMAL HIGH (ref 0.4–1.2)
GFR: 35.09 mL/min — AB (ref >60.00)
Glucose, Bld: 90 mg/dL (ref 70–99)
Potassium: 3.5 mEq/L (ref 3.5–5.1)
Sodium: 141 mEq/L (ref 135–145)

## 2014-07-08 ENCOUNTER — Encounter: Payer: Self-pay | Admitting: Cardiovascular Disease

## 2014-07-08 ENCOUNTER — Ambulatory Visit (INDEPENDENT_AMBULATORY_CARE_PROVIDER_SITE_OTHER): Payer: Medicare PPO | Admitting: Cardiovascular Disease

## 2014-07-08 ENCOUNTER — Ambulatory Visit (INDEPENDENT_AMBULATORY_CARE_PROVIDER_SITE_OTHER): Payer: Medicare PPO | Admitting: Pharmacist

## 2014-07-08 VITALS — BP 152/84 | HR 72 | Ht 64.0 in | Wt 158.4 lb

## 2014-07-08 DIAGNOSIS — I4891 Unspecified atrial fibrillation: Secondary | ICD-10-CM

## 2014-07-08 DIAGNOSIS — I6529 Occlusion and stenosis of unspecified carotid artery: Secondary | ICD-10-CM

## 2014-07-08 DIAGNOSIS — Z5181 Encounter for therapeutic drug level monitoring: Secondary | ICD-10-CM

## 2014-07-08 DIAGNOSIS — I2789 Other specified pulmonary heart diseases: Secondary | ICD-10-CM

## 2014-07-08 DIAGNOSIS — IMO0002 Reserved for concepts with insufficient information to code with codable children: Secondary | ICD-10-CM

## 2014-07-08 DIAGNOSIS — I059 Rheumatic mitral valve disease, unspecified: Secondary | ICD-10-CM

## 2014-07-08 DIAGNOSIS — Z9889 Other specified postprocedural states: Secondary | ICD-10-CM

## 2014-07-08 DIAGNOSIS — I10 Essential (primary) hypertension: Secondary | ICD-10-CM

## 2014-07-08 LAB — POCT INR: INR: 4.1

## 2014-07-08 NOTE — Assessment & Plan Note (Signed)
Normal S1 click with dyspnea and history of elevated PA pressure will order echo

## 2014-07-08 NOTE — Assessment & Plan Note (Signed)
60-79% RICA stenosis.  F/U carotid duplex in 6 months October

## 2014-07-08 NOTE — Progress Notes (Signed)
Patient ID: Kelsey Cochran, female   DOB: 1930/08/21, 80 y.o.   MRN: [id number] Kelsey Cochran is seen today for F/U of dyspnea, anticoagulaiton and MVR and carotid disease. Reviewed duplex from 9/13 and 60-79% RICA stenosis stable. . She has significant anemia with Hct 24 in past most recent 4/15  32   improved . I reviewed her echo from 3/8 and EF normal with normal functioning MVR no evidence of perivalvular leak or other abnormality that could contribute to anemia via shearing effect/hemolysis. She apparantly has had Aranasp shots and F/U with [name] but I don't have these notes. Tried on lisinopril for proteinuria but could not tolerate for light headedness. Encouraged her to F/U with [name] to find low dose substitute Recently put on fenofibrate for tryglycerides and INR elevated Following with coumadin clinic to adjust dose and better.   Seen by [name] and with PA pressure in mid 50's presumed secondary to MV disease offerred cath and ? Rx for pulmonary hypertension but she declined.   Reviewed carotid duplex from 03/12/14  Stable 60-79% RICA  Echo 01/21/12 MVR ok  Study Conclusions  - Left ventricle: Systolic function was normal. The estimated ejection fraction was in the range of 60% to 65%. - Mitral valve: A mechanical prosthesis was present. Mean gradient: 3mm Hg (D). Peak gradient: 9mm Hg (D). - Pulmonary arteries: Systolic pressure was moderately increased. PA peak pressure: 56mm Hg (S).    Some exertional dyspnea , easy bruising Been to 3 weddings recently.  No palpitations   ROS: Denies fever, malais, weight loss, blurry vision, decreased visual acuity, cough, sputum, SOB, hemoptysis, pleuritic pain, palpitaitons, heartburn, abdominal pain, melena, lower extremity edema, claudication, or rash.  All other systems reviewed and negative  General: Affect appropriate Healthy:  appears stated age HEENT: normal Neck supple with no adenopathy JVP normal no bruits no thyromegaly Lungs  clear with no wheezing and good diaphragmatic motion Heart:  S1 click /S2 no murmur, no rub, gallop or click PMI normal Abdomen: benighn, BS positve, no tenderness, no AAA no bruit.  No HSM or HJR Distal pulses intact with no bruits No edema Neuro non-focal Skin warm and dry No muscular weakness   Current Outpatient Prescriptions  Medication Sig Dispense Refill  . alendronate (FOSAMAX) 35 MG tablet TAKE 1 TABLET EVERY 7 DAYS IN THE MORNING AS DIRECTED. SEE PACKAGE FOR ADDITIONAL INSTRUCTIONS  12 tablet  3  . amoxicillin (AMOXIL) 500 MG capsule Take 500 mg by mouth. Takes 4 capsules prior to Dental appt      . aspirin 81 MG tablet Take 81 mg by mouth daily.        Marland Kitchen atorvastatin (LIPITOR) 20 MG tablet TAKE 1 TABLET BY MOUTH EVERY DAY  90 tablet  1  . carvedilol (COREG) 25 MG tablet Take 0.5 tablets (12.5 mg total) by mouth daily.  45 tablet  3  . Diphenhydramine-APAP, sleep, (TYLENOL PM EXTRA STRENGTH PO) Take by mouth. As needed       . docusate sodium (COLACE) 100 MG capsule Take 100 mg by mouth 2 (two) times daily.        . fenofibrate 160 MG tablet TAKE 1 TABLET EVERY DAY  90 tablet  1  . folic acid (FOLVITE) 800 MCG tablet Take 400 mcg by mouth daily.        . furosemide (LASIX) 80 MG tablet Take 1 tablet (80 mg total) by mouth 2 (two) times daily.  180 tablet  1  .  glipiZIDE (GLUCOTROL XL) 2.5 MG 24 hr tablet TAKE 1 TABLET BY MOUTH EVERY DAY  30 tablet  6  . HYDROcodone-acetaminophen (NORCO/VICODIN) 5-325 MG per tablet Take 1 tablet by mouth every 6 (six) hours as needed.  90 tablet  0  . isosorbide mononitrate (IMDUR) 30 MG 24 hr tablet TAKE 1 TABLET EVERY DAY  90 tablet  0  . methocarbamol (ROBAXIN) 500 MG tablet Take 1 tablet (500 mg total) by mouth 2 (two) times daily. As needed  60 tablet  3  . potassium chloride SA (K-DUR,KLOR-CON) 20 MEQ tablet TAKE 1 TABLET BY MOUTH DAILY  30 tablet  5  . promethazine (PHENERGAN) 25 MG suppository Place 1 suppository (25 mg total) rectally  every 6 (six) hours as needed for nausea.  12 each  0  . promethazine (PHENERGAN) 25 MG tablet Take 1 tablet (25 mg total) by mouth every 8 (eight) hours as needed for nausea.  30 tablet  0  . spironolactone (ALDACTONE) 25 MG tablet Take 1 tablet (25 mg total) by mouth daily.  90 tablet  3  . TRUETEST TEST test strip TEST ONCE DAILY  100 each  3  . warfarin (COUMADIN) 2.5 MG tablet TAKE AS DIRECTED  35 tablet  4  . [DISCONTINUED] glyBURIDE (DIABETA) 5 MG tablet Take 1 tablet (5 mg total) by mouth 2 (two) times daily.  180 tablet  0   No current facility-administered medications for this visit.    Allergies  Heparin; Meperidine hcl; and Sulfamethoxazole-trimethoprim  Electrocardiogram: 2.23.15  SR  Poor R wave progression LAD   Today afib rate 76 RAD low voltage  afib new since 2/15  Assessment and Plan

## 2014-07-08 NOTE — Assessment & Plan Note (Signed)
I think she is more dyspnic with PAF will check echo for LA size , EF and estimated PA pressure  She is still averse to any heart cath

## 2014-07-08 NOTE — Patient Instructions (Addendum)

## 2014-07-08 NOTE — Assessment & Plan Note (Signed)
Well controlled.  Continue current medications and low sodium Dash type diet.    

## 2014-07-08 NOTE — Assessment & Plan Note (Signed)
Good rate control and anticoagulation Given advanced age would be hesitant to use antiarrhythmic especially given MVR/LAE

## 2014-07-08 NOTE — Assessment & Plan Note (Addendum)
INR 4.1 today discussed with coumadin clinic no coumadin today and lowering dose Explains easy bruising Suspect it has to with the traveling and weddings she's been to with change in diet

## 2014-07-18 ENCOUNTER — Ambulatory Visit (HOSPITAL_COMMUNITY): Payer: Medicare PPO | Attending: Cardiology

## 2014-07-18 ENCOUNTER — Ambulatory Visit (INDEPENDENT_AMBULATORY_CARE_PROVIDER_SITE_OTHER): Payer: Medicare PPO

## 2014-07-18 DIAGNOSIS — I10 Essential (primary) hypertension: Secondary | ICD-10-CM | POA: Insufficient documentation

## 2014-07-18 DIAGNOSIS — Z9889 Other specified postprocedural states: Secondary | ICD-10-CM

## 2014-07-18 DIAGNOSIS — I509 Heart failure, unspecified: Secondary | ICD-10-CM | POA: Insufficient documentation

## 2014-07-18 DIAGNOSIS — Z5181 Encounter for therapeutic drug level monitoring: Secondary | ICD-10-CM

## 2014-07-18 DIAGNOSIS — Z8673 Personal history of transient ischemic attack (TIA), and cerebral infarction without residual deficits: Secondary | ICD-10-CM | POA: Insufficient documentation

## 2014-07-18 DIAGNOSIS — I059 Rheumatic mitral valve disease, unspecified: Secondary | ICD-10-CM

## 2014-07-18 DIAGNOSIS — I2510 Atherosclerotic heart disease of native coronary artery without angina pectoris: Secondary | ICD-10-CM | POA: Insufficient documentation

## 2014-07-18 DIAGNOSIS — J4489 Other specified chronic obstructive pulmonary disease: Secondary | ICD-10-CM | POA: Insufficient documentation

## 2014-07-18 DIAGNOSIS — I4891 Unspecified atrial fibrillation: Secondary | ICD-10-CM | POA: Diagnosis not present

## 2014-07-18 DIAGNOSIS — J449 Chronic obstructive pulmonary disease, unspecified: Secondary | ICD-10-CM | POA: Diagnosis not present

## 2014-07-18 LAB — POCT INR: INR: 3

## 2014-07-18 NOTE — Progress Notes (Signed)
2D Echo completed. 07/18/2014 

## 2014-08-01 ENCOUNTER — Ambulatory Visit (INDEPENDENT_AMBULATORY_CARE_PROVIDER_SITE_OTHER): Payer: Medicare PPO | Admitting: Pharmacist

## 2014-08-01 DIAGNOSIS — Z9889 Other specified postprocedural states: Secondary | ICD-10-CM

## 2014-08-01 DIAGNOSIS — I4891 Unspecified atrial fibrillation: Secondary | ICD-10-CM

## 2014-08-01 DIAGNOSIS — Z5181 Encounter for therapeutic drug level monitoring: Secondary | ICD-10-CM

## 2014-08-01 DIAGNOSIS — I059 Rheumatic mitral valve disease, unspecified: Secondary | ICD-10-CM

## 2014-08-01 LAB — POCT INR: INR: 3.8

## 2014-08-05 ENCOUNTER — Telehealth: Payer: Self-pay | Admitting: Cardiovascular Disease

## 2014-08-05 NOTE — Telephone Encounter (Signed)
LMTCB ./CY 

## 2014-08-05 NOTE — Telephone Encounter (Signed)
SEE ECHO RESULTS./CY

## 2014-08-05 NOTE — Telephone Encounter (Signed)
F/u ° ° °Pt returning your call. Please call °

## 2014-08-05 NOTE — Telephone Encounter (Signed)
New problem   Pt had an echo 07/18/14 and returning a call back concerning possibility of cardiac cath that was suggested to her. Please call pt

## 2014-08-07 ENCOUNTER — Telehealth: Payer: Self-pay | Admitting: Cardiology

## 2014-08-07 NOTE — Telephone Encounter (Signed)
Pt has been having exertional SOB and chest pain. No rest symptoms. Family thinks anxiety may be playing a role but would like her seen ASAP. I have contacted the office to get her worked in 08/08/14.  Corine Shelter PA-C 08/07/2014 6:01 PM

## 2014-08-08 ENCOUNTER — Telehealth: Payer: Self-pay | Admitting: Cardiovascular Disease

## 2014-08-08 NOTE — Telephone Encounter (Signed)
New problem   Pt waiting on call from office to see if she need to be seen today. Pt's daughter spoke to PA last night. please call pt.

## 2014-08-08 NOTE — Telephone Encounter (Signed)
error 

## 2014-08-08 NOTE — Telephone Encounter (Signed)
SPOKE WITH PT RE MESSAGE  WISHES  TO KEEP APPT ON MON  WITH  DR Eden Emms AND  NOT  BE  SEEN AS  REQUESTED  BY [facility] TODAY  ,HOWEVER , DID  NOTE  THAT  OVER  THE  LAST 7-10 DAYS  HAD  SOME FULLNESS IN NECK WITH  DISCOMFORT RADIATING TO JAW  MENTIONED  THIS  TO DAUGHTER  YESTERDAY  WHO CALLED  AND SPOKE  WITH  [facility]  YESTERDAY  PER  PT   INSTRUCTED  TO  NOT OVER  EXERT  SELF  AND  IF  S/S  INCREASE OR  WORSEN TO  GO TO ER  FOR EVAL  VERBALIZED UNDERSATNDING.WILL FORWARD TO DR Eden Emms FOR REVIEW .Zack Seal

## 2014-08-12 ENCOUNTER — Encounter: Payer: Self-pay | Admitting: Cardiovascular Disease

## 2014-08-12 ENCOUNTER — Ambulatory Visit (INDEPENDENT_AMBULATORY_CARE_PROVIDER_SITE_OTHER): Payer: Medicare PPO | Admitting: Cardiovascular Disease

## 2014-08-12 VITALS — BP 130/80 | HR 100 | Ht 64.0 in | Wt 158.1 lb

## 2014-08-12 DIAGNOSIS — I10 Essential (primary) hypertension: Secondary | ICD-10-CM

## 2014-08-12 DIAGNOSIS — R220 Localized swelling, mass and lump, head: Secondary | ICD-10-CM

## 2014-08-12 DIAGNOSIS — F329 Major depressive disorder, single episode, unspecified: Secondary | ICD-10-CM

## 2014-08-12 DIAGNOSIS — F3289 Other specified depressive episodes: Secondary | ICD-10-CM

## 2014-08-12 DIAGNOSIS — F32A Depression, unspecified: Secondary | ICD-10-CM | POA: Insufficient documentation

## 2014-08-12 DIAGNOSIS — I480 Paroxysmal atrial fibrillation: Secondary | ICD-10-CM

## 2014-08-12 DIAGNOSIS — R0789 Other chest pain: Secondary | ICD-10-CM

## 2014-08-12 DIAGNOSIS — I4891 Unspecified atrial fibrillation: Secondary | ICD-10-CM

## 2014-08-12 DIAGNOSIS — R221 Localized swelling, mass and lump, neck: Secondary | ICD-10-CM

## 2014-08-12 DIAGNOSIS — IMO0002 Reserved for concepts with insufficient information to code with codable children: Secondary | ICD-10-CM

## 2014-08-12 DIAGNOSIS — I2789 Other specified pulmonary heart diseases: Secondary | ICD-10-CM

## 2014-08-12 DIAGNOSIS — R079 Chest pain, unspecified: Secondary | ICD-10-CM

## 2014-08-12 DIAGNOSIS — I059 Rheumatic mitral valve disease, unspecified: Secondary | ICD-10-CM

## 2014-08-12 NOTE — Assessment & Plan Note (Signed)
Normal valve function continue SBE prophylaxis and coumadin

## 2014-08-12 NOTE — Assessment & Plan Note (Signed)
Maint NSR continue coumadin and beta blockade

## 2014-08-12 NOTE — Assessment & Plan Note (Signed)
I think she is very depressed and somatizing to get attention from daughter.  Xanax has helped F/U Tabori to get on better meds

## 2014-08-12 NOTE — Progress Notes (Signed)
Patient ID: SAYRA FRISBY, female   DOB: [birth year], 80 y.o.   MRN: [id number] Luetta is seen today for F/U of dyspnea, anticoagulaiton and MVR and carotid disease. Reviewed duplex from 9/13 and 60-79% RICA stenosis stable. . She has significant anemia with Hct 24 in past most recent 4/15 32 improved . I reviewed her echo from 3/8 and EF normal with normal functioning MVR no evidence of perivalvular leak or other abnormality that could contribute to anemia via shearing effect/hemolysis. She apparantly has had Aranasp shots and F/U with [name] but I don't have these notes. Tried on lisinopril for proteinuria but could not tolerate for light headedness. Encouraged her to F/U with [name] to find low dose substitute Recently put on fenofibrate for tryglycerides and INR elevated Following with coumadin clinic to adjust dose and better.  Seen by [name] and with PA pressure in mid 50's presumed secondary to MV disease offerred cath and ? Rx for pulmonary hypertension but she declined.  Reviewed carotid duplex from 03/12/14 Stable 60-79% RICA  Echo 01/21/12 MVR ok  Study Conclusions  - Left ventricle: Systolic function was normal. The estimated ejection fraction was in the range of 60% to 65%. - Mitral valve: A mechanical prosthesis was present. Mean gradient: 3mm Hg (D). Peak gradient: 9mm Hg (D). - Pulmonary arteries: Systolic pressure was moderately increased. PA peak pressure: 56mm Hg (S).  Some exertional dyspnea , easy bruising Been to 3 weddings recently. No palpitations    Seems very depressed with labile mood and periods of tears.  Complains of chest heaviness and throat tightness with exertion Started new mouth wash for thrush and not Sure if it is related to this.  Still not sure about right heart cath  Indicated that her PA pressure was only moderately elevated and likely from her chronic MVD   ROS: Denies fever, malais, weight loss, blurry vision, decreased visual acuity, cough,  sputum, SOB, hemoptysis, pleuritic pain, palpitaitons, heartburn, abdominal pain, melena, lower extremity edema, claudication, or rash.  All other systems reviewed and negative  General: Depressed  Healthy:  appears stated age HEENT: normal Neck supple with no adenopathy JVP normal no bruits no thyromegaly Lungs clear with no wheezing and good diaphragmatic motion Heart:  S1 click /S2 no murmur, no rub, gallop or click PMI normal Abdomen: benighn, BS positve, no tenderness, no AAA no bruit.  No HSM or HJR Distal pulses intact with no bruits No edema Neuro non-focal Skin warm and dry No muscular weakness   Current Outpatient Prescriptions  Medication Sig Dispense Refill  . alendronate (FOSAMAX) 35 MG tablet TAKE 1 TABLET EVERY 7 DAYS IN THE MORNING AS DIRECTED. SEE PACKAGE FOR ADDITIONAL INSTRUCTIONS  12 tablet  3  . amoxicillin (AMOXIL) 500 MG capsule Take 500 mg by mouth. Takes 4 capsules prior to Dental appt      . aspirin 81 MG tablet Take 81 mg by mouth daily.        Marland Kitchen atorvastatin (LIPITOR) 20 MG tablet TAKE 1 TABLET BY MOUTH EVERY DAY  90 tablet  1  . carvedilol (COREG) 25 MG tablet Take 0.5 tablets (12.5 mg total) by mouth daily.  45 tablet  3  . Diphenhydramine-APAP, sleep, (TYLENOL PM EXTRA STRENGTH PO) Take by mouth. As needed       . docusate sodium (COLACE) 100 MG capsule Take 100 mg by mouth 2 (two) times daily.        . fenofibrate 160 MG tablet TAKE 1  TABLET EVERY DAY  90 tablet  1  . folic acid (FOLVITE) 800 MCG tablet Take 400 mcg by mouth daily.        . furosemide (LASIX) 80 MG tablet Take 1 tablet (80 mg total) by mouth 2 (two) times daily.  180 tablet  1  . glipiZIDE (GLUCOTROL XL) 2.5 MG 24 hr tablet TAKE 1 TABLET BY MOUTH EVERY DAY  30 tablet  6  . HYDROcodone-acetaminophen (NORCO/VICODIN) 5-325 MG per tablet Take 1 tablet by mouth every 6 (six) hours as needed.  90 tablet  0  . isosorbide mononitrate (IMDUR) 30 MG 24 hr tablet TAKE 1 TABLET EVERY DAY  90  tablet  0  . methocarbamol (ROBAXIN) 500 MG tablet Take 1 tablet (500 mg total) by mouth 2 (two) times daily. As needed  60 tablet  3  . potassium chloride SA (K-DUR,KLOR-CON) 20 MEQ tablet TAKE 1 TABLET BY MOUTH DAILY  30 tablet  5  . promethazine (PHENERGAN) 25 MG suppository Place 1 suppository (25 mg total) rectally every 6 (six) hours as needed for nausea.  12 each  0  . promethazine (PHENERGAN) 25 MG tablet Take 1 tablet (25 mg total) by mouth every 8 (eight) hours as needed for nausea.  30 tablet  0  . spironolactone (ALDACTONE) 25 MG tablet Take 1 tablet (25 mg total) by mouth daily.  90 tablet  3  . TRUETEST TEST test strip TEST ONCE DAILY  100 each  3  . warfarin (COUMADIN) 2.5 MG tablet TAKE AS DIRECTED  35 tablet  4  . [DISCONTINUED] glyBURIDE (DIABETA) 5 MG tablet Take 1 tablet (5 mg total) by mouth 2 (two) times daily.  180 tablet  0   No current facility-administered medications for this visit.    Allergies  Heparin; Meperidine hcl; and Sulfamethoxazole-trimethoprim  Electrocardiogram:  SR PAC's poor R wave progression   Assessment and Plan

## 2014-08-12 NOTE — Patient Instructions (Addendum)
Your physician recommends that you schedule a follow-up appointment in:  NEXT AVAILABLE WITH  DR Eden Emms You have been referred to CARDIAC  [facility]  Your physician recommends that you continue on your current medications as directed. Please refer to the Current Medication list given to you today. Your physician has requested that you have a lexiscan myoview. For further information please visit [email or web address] Please follow instruction sheet, as given. Your physician has requested that you have a carotid duplex. This test is an ultrasound of the carotid arteries in your neck. It looks at blood flow through these arteries that supply the brain with blood. Allow one hour for this exam. There are no restrictions or special instructions.

## 2014-08-12 NOTE — Assessment & Plan Note (Signed)
I think her dyspnea is functional and also related to depression.  Will refer to cardiac rehab.  Consider right heart and changing imdur to revatio after depression addressed by primary

## 2014-08-12 NOTE — Assessment & Plan Note (Signed)
Throat tightness, chest pressue and dyspnea with exertion R/O CAD  F/U lexiscan myovue

## 2014-08-12 NOTE — Assessment & Plan Note (Signed)
Well controlled.  Continue current medications and low sodium Dash type diet.    

## 2014-08-13 ENCOUNTER — Ambulatory Visit (HOSPITAL_COMMUNITY): Payer: Medicare PPO | Attending: Cardiology | Admitting: Cardiology

## 2014-08-13 ENCOUNTER — Ambulatory Visit (INDEPENDENT_AMBULATORY_CARE_PROVIDER_SITE_OTHER): Payer: Medicare PPO | Admitting: Pharmacist Clinician (PhC)/ Clinical Pharmacy Specialist

## 2014-08-13 DIAGNOSIS — E785 Hyperlipidemia, unspecified: Secondary | ICD-10-CM | POA: Diagnosis not present

## 2014-08-13 DIAGNOSIS — I480 Paroxysmal atrial fibrillation: Secondary | ICD-10-CM

## 2014-08-13 DIAGNOSIS — R221 Localized swelling, mass and lump, neck: Secondary | ICD-10-CM

## 2014-08-13 DIAGNOSIS — I059 Rheumatic mitral valve disease, unspecified: Secondary | ICD-10-CM

## 2014-08-13 DIAGNOSIS — I4891 Unspecified atrial fibrillation: Secondary | ICD-10-CM | POA: Insufficient documentation

## 2014-08-13 DIAGNOSIS — E119 Type 2 diabetes mellitus without complications: Secondary | ICD-10-CM | POA: Insufficient documentation

## 2014-08-13 DIAGNOSIS — Z951 Presence of aortocoronary bypass graft: Secondary | ICD-10-CM | POA: Insufficient documentation

## 2014-08-13 DIAGNOSIS — Z87891 Personal history of nicotine dependence: Secondary | ICD-10-CM | POA: Insufficient documentation

## 2014-08-13 DIAGNOSIS — Z8673 Personal history of transient ischemic attack (TIA), and cerebral infarction without residual deficits: Secondary | ICD-10-CM | POA: Insufficient documentation

## 2014-08-13 DIAGNOSIS — I6529 Occlusion and stenosis of unspecified carotid artery: Secondary | ICD-10-CM | POA: Insufficient documentation

## 2014-08-13 DIAGNOSIS — I10 Essential (primary) hypertension: Secondary | ICD-10-CM | POA: Insufficient documentation

## 2014-08-13 DIAGNOSIS — Z5181 Encounter for therapeutic drug level monitoring: Secondary | ICD-10-CM

## 2014-08-13 DIAGNOSIS — Z9889 Other specified postprocedural states: Secondary | ICD-10-CM

## 2014-08-13 LAB — POCT INR: INR: 2.7

## 2014-08-13 NOTE — Progress Notes (Signed)
Carotid duplex performed 

## 2014-08-15 ENCOUNTER — Encounter: Payer: Self-pay | Admitting: Cardiology

## 2014-08-19 ENCOUNTER — Ambulatory Visit (HOSPITAL_COMMUNITY): Payer: Medicare PPO | Attending: Internal Medicine | Admitting: Radiology

## 2014-08-19 VITALS — BP 153/73 | HR 93 | Ht 64.0 in | Wt 155.0 lb

## 2014-08-19 DIAGNOSIS — R0600 Dyspnea, unspecified: Secondary | ICD-10-CM | POA: Diagnosis not present

## 2014-08-19 DIAGNOSIS — E119 Type 2 diabetes mellitus without complications: Secondary | ICD-10-CM | POA: Insufficient documentation

## 2014-08-19 DIAGNOSIS — R110 Nausea: Secondary | ICD-10-CM

## 2014-08-19 DIAGNOSIS — I4891 Unspecified atrial fibrillation: Secondary | ICD-10-CM | POA: Insufficient documentation

## 2014-08-19 DIAGNOSIS — I10 Essential (primary) hypertension: Secondary | ICD-10-CM | POA: Insufficient documentation

## 2014-08-19 DIAGNOSIS — R079 Chest pain, unspecified: Secondary | ICD-10-CM

## 2014-08-19 DIAGNOSIS — R0789 Other chest pain: Secondary | ICD-10-CM | POA: Insufficient documentation

## 2014-08-19 DIAGNOSIS — R221 Localized swelling, mass and lump, neck: Secondary | ICD-10-CM

## 2014-08-19 DIAGNOSIS — R0602 Shortness of breath: Secondary | ICD-10-CM

## 2014-08-19 MED ORDER — REGADENOSON 0.4 MG/5ML IV SOLN
0.4000 mg | Freq: Once | INTRAVENOUS | Status: AC
  Administered 2014-08-19: 0.4 mg via INTRAVENOUS

## 2014-08-19 MED ORDER — TECHNETIUM TC 99M SESTAMIBI GENERIC - CARDIOLITE
33.0000 | Freq: Once | INTRAVENOUS | Status: AC | PRN
  Administered 2014-08-19: 33 via INTRAVENOUS

## 2014-08-19 MED ORDER — AMINOPHYLLINE 25 MG/ML IV SOLN
75.0000 mg | Freq: Once | INTRAVENOUS | Status: AC
  Administered 2014-08-19: 75 mg via INTRAVENOUS

## 2014-08-19 MED ORDER — TECHNETIUM TC 99M SESTAMIBI GENERIC - CARDIOLITE
11.0000 | Freq: Once | INTRAVENOUS | Status: AC | PRN
  Administered 2014-08-19: 11 via INTRAVENOUS

## 2014-08-19 NOTE — Progress Notes (Signed)
[facility] SITE 3 NUCLEAR MED [address] [place], [place] [address] [phone]    Cardiology Nuclear Med Study  Katy ApoSara B Cochran is a 80 y.o. female     MRN : [id number]     DOB: 12/16/1929  Procedure Date: 08/19/2014  Nuclear Med Background Indication for Stress Test:  Evaluation for Ischemia History:  MPI 2002 (normal) EF 56% Cardiac Risk Factors: Carotid Disease, Hypertension, NIDDM and Atrial Fib  Symptoms:  Chest Tightness (last date of chest discomfort was yesterday) and DOE   Nuclear Pre-Procedure Caffeine/Decaff Intake:  None NPO After: 7:00pm   Lungs:  clear O2 Sat: 95% on room air. IV 0.9% NS with Angio Cath:  22g  IV Site: R Hand  IV Started by:  Cathlyn Parsonsynthia Hasspacher, RN  Chest Size (in):  38 Cup Size: D  Height: 5\' 4"  (1.626 m)  Weight:  155 lb (70.308 kg)  BMI:  Body mass index is 26.59 kg/(m^2). Tech Comments:  No Coreg x 17 hrs    Nuclear Med Study 1 or 2 day study: 1 day  Stress Test Type:  Lexiscan  Reading MD: n/a  Order Authorizing Provider:  Burna CashPeter Nishan,MD  Resting Radionuclide: Technetium 761m Sestamibi  Resting Radionuclide Dose: 11.0 mCi   Stress Radionuclide:  Technetium 5861m Sestamibi  Stress Radionuclide Dose: 33.0 mCi           Stress Protocol Rest HR: 93 Stress HR: 101  Rest BP: 153/73 Stress BP: 115/66  Exercise Time (min): n/a METS: n/a           Dose of Adenosine (mg):  n/a Dose of Lexiscan: 0.4 mg  Dose of Atropine (mg): n/a Dose of Dobutamine: n/a mcg/kg/min (at max HR)  Stress Test Technologist: Raynesha Tiedt ChimesSharon Brooks, BS-ES  Nuclear Technologist:  Jackquline BoschElzbieta Kubak,CNMT     Rest Procedure:  Myocardial perfusion imaging was performed at rest 45 minutes following the intravenous administration of Technetium 2961m Sestamibi. Rest ECG: SR, lateral MI, age undetermined., non-specific CT- T wave abnormalities.  Stress Procedure:  The patient received IV Lexiscan 0.4 mg over 15-seconds.  Technetium 2361m Sestamibi injected at 30-seconds.   Quantitative spect images were obtained after a 45 minute delay.  During the infusion of Lexiscan the patient complained of nausea and chest tightness.  The chest tightness began to resolve but the nausea remained so 75 mg aminophylline was given and the patient began to feel better almost immediately.    Stress ECG: No significant change from baseline ECG  QPS Raw Data Images:  Normal; no motion artifact; normal heart/lung ratio. Stress Images:  Normal homogeneous uptake in all areas of the myocardium. Rest Images:  Normal homogeneous uptake in all areas of the myocardium. Subtraction (SDS):  No evidence of ischemia. Transient Ischemic Dilatation (Normal <1.22):  1.10 Lung/Heart Ratio (Normal <0.45):  0.43  Quantitative Gated Spect Images QGS EDV:  NA QGS ESV:  NA  Impression Exercise Capacity:  Lexiscan with no exercise. BP Response:  Normal blood pressure response. Clinical Symptoms:  No significant symptoms noted. ECG Impression:  No significant ST segment change suggestive of ischemia. Comparison with Prior Nuclear Study: No images to compare  Overall Impression:  Normal stress nuclear study.  LV Ejection Fraction: Study not gated.  LV Wall Motion: NA   Lars MassonELSON, Bev Drennen H 08/19/2014

## 2014-08-21 ENCOUNTER — Telehealth: Payer: Self-pay | Admitting: *Deleted

## 2014-08-21 ENCOUNTER — Other Ambulatory Visit: Payer: Self-pay | Admitting: Family Medicine

## 2014-08-21 NOTE — Telephone Encounter (Signed)
WHILE GIVING  PT   MYOVIEW  RESULTS  STATED HAD  NOT HEARD  FROM  REHAB    INFORMED  PT WILL  CHECK ON STATUS ORDER  FORM LOCATED  AND  FILLED  OUT   AND  FAXED TO [facility] .Zack Seal/CY

## 2014-08-21 NOTE — Telephone Encounter (Signed)
Med filled.  

## 2014-08-22 ENCOUNTER — Other Ambulatory Visit: Payer: Self-pay | Admitting: Cardiovascular Disease

## 2014-08-26 ENCOUNTER — Encounter: Payer: Self-pay | Admitting: Family Medicine

## 2014-08-26 ENCOUNTER — Ambulatory Visit (INDEPENDENT_AMBULATORY_CARE_PROVIDER_SITE_OTHER): Payer: Medicare PPO | Admitting: Family Medicine

## 2014-08-26 VITALS — BP 130/84 | HR 89 | Temp 97.9°F | Resp 16 | Wt 162.1 lb

## 2014-08-26 DIAGNOSIS — F411 Generalized anxiety disorder: Secondary | ICD-10-CM

## 2014-08-26 MED ORDER — BUSPIRONE HCL 7.5 MG PO TABS: ORAL_TABLET | ORAL | Status: DC

## 2014-08-26 NOTE — Progress Notes (Signed)
Pre visit review using our clinic review tool, if applicable. No additional management support is needed unless otherwise documented below in the visit note. 

## 2014-08-26 NOTE — Patient Instructions (Signed)
Follow up as scheduled Start the Buspar twice daily Cardiac rehab will be a great improvement for both shortness of breath and mood This is not a magic pill, it will take a few weeks to show improvement Call with any questions or concerns Hang in there!!! Happy Early [place] OuchBirthday!!

## 2014-08-26 NOTE — Assessment & Plan Note (Signed)
New.  Discussed dx w/ both pt and daughter.  Pt admits to feeling 'worthless', frequent tearfulness, high levels of nervousness.  Concern for increased fall risk w/ benzos.  There are age related concerns w/ SSRI.  Will start Buspar and monitor for improvement.  Agree that cardiac rehab would be good for pt.  Recommended counseling.  Pt resistant to this at this time.  Total time spent w/ pt- 27 min, >50% spent counseling.

## 2014-08-26 NOTE — Progress Notes (Signed)
   Subjective:    Patient ID: Kelsey Cochran, female    DOB: 09/23/1930, 80 y.o.   MRN: [id number]  HPI Depression- pt saw Dr Eden EmmsNishan on 9/28 and he felt she was 'very depressed' and 'somatizing to get attention from daughter'.  Pt reports she did have a range of emotion- stubborn, anger, and tears- in his office.  'i feel worthless'- pt is very upset about depending on her children for things.  Pt tends to direct anger towards daughters.  Cards wants pt to do cardiac rehab- pt is hesitant about this.  Daughter feels it would help pt have purpose to her day.  Pt is fearful of her carotid artery stenosis causing a stroke or her SOB causing problems.   Review of Systems For ROS see HPI     Objective:   Physical Exam  Vitals reviewed. Constitutional: She is oriented to person, place, and time. She appears well-developed and well-nourished. No distress.  Neurological: She is alert and oriented to person, place, and time.  Skin: Skin is warm and dry.  Psychiatric:  Angry, tearful, anxious- labile emotions cycling during visits          Assessment & Plan:

## 2014-08-28 ENCOUNTER — Other Ambulatory Visit: Payer: Self-pay

## 2014-08-28 MED ORDER — ATORVASTATIN CALCIUM 20 MG PO TABS: ORAL_TABLET | ORAL | Status: DC

## 2014-08-29 ENCOUNTER — Telehealth (HOSPITAL_COMMUNITY): Payer: Self-pay | Admitting: *Deleted

## 2014-09-02 NOTE — Telephone Encounter (Signed)
PT  MAY PARTICIPATE IN  MAINTENANCE  PROGRAM  AT [facility] PER  DR Eden EmmsNISHAN  PT  AWARE  .Zack Seal/CY

## 2014-09-02 NOTE — Telephone Encounter (Signed)
Message copied by Alois ClicheYORK, Imanii Gosdin E on Mon Sep 02, 2014  3:33 PM ------      Message from: Cammy CopaWHITAKER, MARIA W      Created: Thu Aug 29, 2014  2:52 PM      Regarding: cardiac rehab       Good afternoon Wynona CanesChristine,            Thank you for the referral for MS Manson PasseyBrown. I did not think she will meet the CHF criteria based on her EF. I faxed a maintenance referral over for Dr Eden EmmsNishan  To sign is he wants her to participate in the non monitored program. It is $68 a month.                  Have a great day!                  Byrd HesselbachMaria  ------

## 2014-09-03 ENCOUNTER — Ambulatory Visit (INDEPENDENT_AMBULATORY_CARE_PROVIDER_SITE_OTHER): Payer: Medicare PPO | Admitting: *Deleted

## 2014-09-03 DIAGNOSIS — Z9889 Other specified postprocedural states: Secondary | ICD-10-CM

## 2014-09-03 DIAGNOSIS — I4891 Unspecified atrial fibrillation: Secondary | ICD-10-CM

## 2014-09-03 DIAGNOSIS — I059 Rheumatic mitral valve disease, unspecified: Secondary | ICD-10-CM

## 2014-09-03 DIAGNOSIS — Z5181 Encounter for therapeutic drug level monitoring: Secondary | ICD-10-CM

## 2014-09-03 LAB — POCT INR: INR: 3.6

## 2014-09-13 ENCOUNTER — Encounter: Payer: Self-pay | Admitting: *Deleted

## 2014-09-24 ENCOUNTER — Other Ambulatory Visit: Payer: Self-pay | Admitting: Cardiology

## 2014-09-26 ENCOUNTER — Ambulatory Visit (INDEPENDENT_AMBULATORY_CARE_PROVIDER_SITE_OTHER): Payer: Medicare PPO | Admitting: *Deleted

## 2014-09-26 DIAGNOSIS — I4891 Unspecified atrial fibrillation: Secondary | ICD-10-CM

## 2014-09-26 DIAGNOSIS — Z9889 Other specified postprocedural states: Secondary | ICD-10-CM

## 2014-09-26 DIAGNOSIS — I059 Rheumatic mitral valve disease, unspecified: Secondary | ICD-10-CM

## 2014-09-26 DIAGNOSIS — Z5181 Encounter for therapeutic drug level monitoring: Secondary | ICD-10-CM

## 2014-09-26 LAB — POCT INR: INR: 4

## 2014-09-30 ENCOUNTER — Encounter: Payer: Self-pay | Admitting: Cardiovascular Disease

## 2014-09-30 ENCOUNTER — Other Ambulatory Visit: Payer: Self-pay | Admitting: Family Medicine

## 2014-09-30 ENCOUNTER — Ambulatory Visit (INDEPENDENT_AMBULATORY_CARE_PROVIDER_SITE_OTHER): Payer: Medicare PPO | Admitting: Cardiovascular Disease

## 2014-09-30 VITALS — BP 146/84 | HR 75 | Ht 64.0 in | Wt 160.8 lb

## 2014-09-30 DIAGNOSIS — I482 Chronic atrial fibrillation, unspecified: Secondary | ICD-10-CM

## 2014-09-30 DIAGNOSIS — I059 Rheumatic mitral valve disease, unspecified: Secondary | ICD-10-CM

## 2014-09-30 DIAGNOSIS — F32A Depression, unspecified: Secondary | ICD-10-CM

## 2014-09-30 DIAGNOSIS — I10 Essential (primary) hypertension: Secondary | ICD-10-CM

## 2014-09-30 DIAGNOSIS — IMO0002 Reserved for concepts with insufficient information to code with codable children: Secondary | ICD-10-CM

## 2014-09-30 DIAGNOSIS — I272 Other secondary pulmonary hypertension: Secondary | ICD-10-CM

## 2014-09-30 DIAGNOSIS — F329 Major depressive disorder, single episode, unspecified: Secondary | ICD-10-CM

## 2014-09-30 NOTE — Patient Instructions (Signed)
Your physician wants you to follow-up in:  6 MONTHS WITH DR NISHAN  You will receive a reminder letter in the mail two months in advance. If you don't receive a letter, please call our office to schedule the follow-up appointment. Your physician recommends that you continue on your current medications as directed. Please refer to the Current Medication list given to you today. 

## 2014-09-30 NOTE — Telephone Encounter (Signed)
Med filled.  

## 2014-09-30 NOTE — Assessment & Plan Note (Signed)
Normal functioning valve by echo continue beta blocker and anticoagulation SBE prophylaxis

## 2014-09-30 NOTE — Assessment & Plan Note (Signed)
Improved with Buspar  F/u Tabori She enjoys her encounters

## 2014-09-30 NOTE — Assessment & Plan Note (Signed)
Valvular related to MVR  Good rate control and anticoagulation

## 2014-09-30 NOTE — Assessment & Plan Note (Signed)
Multifactorial including MVD.  Continue diuretic and nitrates  She declines right heart cath. Unable to afford selective pulmonary dilators Given age and depression conservative Rx warranted

## 2014-09-30 NOTE — Progress Notes (Signed)
Patient ID: Kelsey Cochran, female   DOB: 10/07/1930, [age] y.o.   MRN: [id number] Kelsey Cochran is seen today for F/U of dyspnea, anticoagulaiton and MVR and carotid disease. Reviewed duplex from 9/13 and 60-79% RICA stenosis stable. . She has significant anemia with Hct 24 in past most recent 4/15 32 improved . I reviewed her echo from 3/8 and EF normal with normal functioning MVR no evidence of perivalvular leak or other abnormality that could contribute to anemia via shearing effect/hemolysis. She apparantly has had Aranasp shots and F/U with Dr Myna HidalgoEnnever but I don't have these notes. Tried on lisinopril for proteinuria but could not tolerate for light headedness. Encouraged her to F/U with Dr Beverely Lowabori to find low dose substitute Recently put on fenofibrate for tryglycerides and INR elevated Following with coumadin clinic to adjust dose and better.  Seen by Doctor Delford FieldWright and with PA pressure in mid 50's presumed secondary to MV disease offerred cath and ? Rx for pulmonary hypertension but she declined.   Echo 9/15 with normal appearing MVR but severe pulmonary hypertension  Study Conclusions  - Left ventricle: The cavity size was normal. Systolic function was normal. The estimated ejection fraction was in the range of 55% to 60%. Wall motion was normal; there were no regional wall motion abnormalities. - Aortic valve: Trileaflet; mildly thickened leaflets. - Mitral valve: A mechanical prosthesis was present and functioning normally. Valve area by continuity equation (using LVOT flow): 1.45 cm^2. - Left atrium: The atrium was mildly dilated. - Tricuspid valve: There was mild-moderate regurgitation. - Pulmonic valve: There was trivial regurgitation. - Pulmonary arteries: PA peak pressure: 85 mm Hg (S).  Impressions:  - The right ventricular systolic pressure was increased consistent with severe pulmonary hypertension.  9/15  RICA 60% stenosis  08/20/14  Myovue was normal   She was started on  Buspar with some lightening of her mood.  Discussed pro's/cons of right heart cath and she prefers not to have it again She is on beta blocker to maximize diastolic filling period, diuretic to lower filling pressures  And imdur as vasodilator as she cannot afford Any of the specific pulmonary vasodilators    ROS: Denies fever, malais, weight loss, blurry vision, decreased visual acuity, cough, sputum, SOB, hemoptysis, pleuritic pain, palpitaitons, heartburn, abdominal pain, melena, lower extremity edema, claudication, or rash.  All other systems reviewed and negative  General: Still some depressed  Healthy:  appears stated age HEENT: normal Neck supple with no adenopathy JVP normal no bruits no thyromegaly Lungs clear with no wheezing and good diaphragmatic motion Heart:  S1 click /S2 no murmur, no rub, gallop or click PMI normal Abdomen: benighn, BS positve, no tenderness, no AAA no bruit.  No HSM or HJR Distal pulses intact with no bruits No edema Neuro non-focal Skin warm and dry No muscular weakness   Current Outpatient Prescriptions  Medication Sig Dispense Refill  . alendronate (FOSAMAX) 35 MG tablet TAKE 1 TABLET EVERY 7 DAYS IN THE MORNING AS DIRECTED. SEE PACKAGE FOR ADDITIONAL INSTRUCTIONS 12 tablet 3  . amoxicillin (AMOXIL) 500 MG capsule Take 500 mg by mouth. Takes 4 capsules prior to Dental appt    . aspirin 81 MG tablet Take 81 mg by mouth daily.      Marland Kitchen. atorvastatin (LIPITOR) 20 MG tablet TAKE 1 TABLET BY MOUTH EVERY DAY 90 tablet 1  . busPIRone (BUSPAR) 7.5 MG tablet 1 tab po bid 60 tablet 1  . carvedilol (COREG) 25 MG tablet Take 0.5 tablets (  12.5 mg total) by mouth daily. 45 tablet 3  . chlorhexidine (PERIDEX) 0.12 % solution Use as directed in the mouth or throat.     . Diphenhydramine-APAP, sleep, (TYLENOL PM EXTRA STRENGTH PO) Take by mouth. As needed     . docusate sodium (COLACE) 100 MG capsule Take 100 mg by mouth 2 (two) times daily.      . fenofibrate 160  MG tablet TAKE 1 TABLET EVERY DAY 90 tablet 1  . FLUARIX QUADRIVALENT 0.5 ML injection   0  . folic acid (FOLVITE) 800 MCG tablet Take 400 mcg by mouth daily.      . furosemide (LASIX) 80 MG tablet Take 1 tablet (80 mg total) by mouth 2 (two) times daily. 180 tablet 1  . glipiZIDE (GLUCOTROL XL) 2.5 MG 24 hr tablet TAKE 1 TABLET BY MOUTH EVERY DAY 30 tablet 1  . HYDROcodone-acetaminophen (NORCO/VICODIN) 5-325 MG per tablet Take 1 tablet by mouth every 6 (six) hours as needed. 90 tablet 0  . isosorbide mononitrate (IMDUR) 30 MG 24 hr tablet TAKE 1 TABLET EVERY DAY 90 tablet 0  . methocarbamol (ROBAXIN) 500 MG tablet TAKE 1 TABLET BY MOUTH TWICE DAILY AS NEEDED 60 tablet 0  . potassium chloride SA (K-DUR,KLOR-CON) 20 MEQ tablet TAKE 1 TABLET BY MOUTH DAILY 30 tablet 5  . promethazine (PHENERGAN) 25 MG suppository Place 1 suppository (25 mg total) rectally every 6 (six) hours as needed for nausea. 12 each 0  . promethazine (PHENERGAN) 25 MG tablet Take 1 tablet (25 mg total) by mouth every 8 (eight) hours as needed for nausea. 30 tablet 0  . spironolactone (ALDACTONE) 25 MG tablet TAKE 1 TABLET EVERY DAY 90 tablet 3  . TRUETEST TEST test strip TEST ONCE DAILY 100 each 3  . warfarin (COUMADIN) 2.5 MG tablet TAKE AS DIRECTED 35 tablet 4  . [DISCONTINUED] glyBURIDE (DIABETA) 5 MG tablet Take 1 tablet (5 mg total) by mouth 2 (two) times daily. 180 tablet 0   No current facility-administered medications for this visit.    Allergies  Heparin; Meperidine hcl; and Sulfamethoxazole-trimethoprim  Electrocardiogram:  afib rate 76  Poor R wave progression RAD   Assessment and Plan

## 2014-09-30 NOTE — Assessment & Plan Note (Signed)
Well controlled.  Continue current medications and low sodium Dash type diet.    

## 2014-10-01 ENCOUNTER — Telehealth: Payer: Self-pay | Admitting: Family Medicine

## 2014-10-01 NOTE — Telephone Encounter (Signed)
emmi mailed  °

## 2014-10-07 ENCOUNTER — Encounter: Payer: Self-pay | Admitting: Family Medicine

## 2014-10-07 ENCOUNTER — Ambulatory Visit (INDEPENDENT_AMBULATORY_CARE_PROVIDER_SITE_OTHER): Payer: Medicare PPO | Admitting: Family Medicine

## 2014-10-07 VITALS — BP 138/84 | HR 72 | Temp 97.9°F | Resp 16 | Ht 64.0 in | Wt 160.0 lb

## 2014-10-07 DIAGNOSIS — E1159 Type 2 diabetes mellitus with other circulatory complications: Secondary | ICD-10-CM

## 2014-10-07 DIAGNOSIS — Z23 Encounter for immunization: Secondary | ICD-10-CM

## 2014-10-07 DIAGNOSIS — E1151 Type 2 diabetes mellitus with diabetic peripheral angiopathy without gangrene: Secondary | ICD-10-CM

## 2014-10-07 DIAGNOSIS — E7800 Pure hypercholesterolemia, unspecified: Secondary | ICD-10-CM

## 2014-10-07 DIAGNOSIS — E559 Vitamin D deficiency, unspecified: Secondary | ICD-10-CM

## 2014-10-07 DIAGNOSIS — Z0000 Encounter for general adult medical examination without abnormal findings: Secondary | ICD-10-CM

## 2014-10-07 DIAGNOSIS — I481 Persistent atrial fibrillation: Secondary | ICD-10-CM

## 2014-10-07 DIAGNOSIS — F411 Generalized anxiety disorder: Secondary | ICD-10-CM

## 2014-10-07 DIAGNOSIS — I4819 Other persistent atrial fibrillation: Secondary | ICD-10-CM

## 2014-10-07 DIAGNOSIS — I10 Essential (primary) hypertension: Secondary | ICD-10-CM

## 2014-10-07 LAB — HEMOGLOBIN A1C: Hgb A1c MFr Bld: 6.4 % (ref 4.6–6.5)

## 2014-10-07 LAB — MICROALBUMIN / CREATININE URINE RATIO
Creatinine,U: 70.5 mg/dL
MICROALB UR: 3.1 mg/dL — AB (ref 0.0–1.9)
Microalb Creat Ratio: 4.4 mg/g (ref 0.0–30.0)

## 2014-10-07 LAB — HEPATIC FUNCTION PANEL
ALT: 12 U/L (ref 0–35)
AST: 27 U/L (ref 0–37)
Albumin: 4.3 g/dL (ref 3.5–5.2)
Alkaline Phosphatase: 56 U/L (ref 39–117)
BILIRUBIN DIRECT: 0.4 mg/dL — AB (ref 0.0–0.3)
Total Bilirubin: 1.3 mg/dL — ABNORMAL HIGH (ref 0.2–1.2)
Total Protein: 7.7 g/dL (ref 6.0–8.3)

## 2014-10-07 LAB — BASIC METABOLIC PANEL
BUN: 19 mg/dL (ref 6–23)
CO2: 24 mEq/L (ref 19–32)
Calcium: 9.2 mg/dL (ref 8.4–10.5)
Chloride: 102 mEq/L (ref 96–112)
Creatinine, Ser: 1.5 mg/dL — ABNORMAL HIGH (ref 0.4–1.2)
GFR: 35.33 mL/min — ABNORMAL LOW (ref >60.00)
Glucose, Bld: 101 mg/dL — ABNORMAL HIGH (ref 70–99)
POTASSIUM: 3.6 meq/L (ref 3.5–5.1)
Sodium: 140 mEq/L (ref 135–145)

## 2014-10-07 LAB — LIPID PANEL
CHOL/HDL RATIO: 4
Cholesterol: 144 mg/dL (ref 0–200)
HDL: 37.3 mg/dL — ABNORMAL LOW (ref >39.00)
LDL Cholesterol: 85 mg/dL (ref 0–99)
NonHDL: 106.7
Triglycerides: 111 mg/dL (ref 0.0–149.0)
VLDL: 22.2 mg/dL (ref 0.0–40.0)

## 2014-10-07 LAB — CBC WITH DIFFERENTIAL/PLATELET
Basophils Absolute: 0 10*3/uL (ref 0.0–0.1)
Basophils Relative: 0.2 % (ref 0.0–3.0)
EOS PCT: 5.3 % — AB (ref 0.0–5.0)
Eosinophils Absolute: 0.5 10*3/uL (ref 0.0–0.7)
HEMATOCRIT: 37.4 % (ref 36.0–46.0)
Hemoglobin: 12.1 g/dL (ref 12.0–15.0)
LYMPHS ABS: 0.7 10*3/uL (ref 0.7–4.0)
Lymphocytes Relative: 8.2 % — ABNORMAL LOW (ref 12.0–46.0)
MCHC: 32.4 g/dL (ref 30.0–36.0)
MCV: 97.3 fl (ref 78.0–100.0)
MONO ABS: 0.4 10*3/uL (ref 0.1–1.0)
Monocytes Relative: 4.1 % (ref 3.0–12.0)
Neutro Abs: 7.2 10*3/uL (ref 1.4–7.7)
Neutrophils Relative %: 82.2 % — ABNORMAL HIGH (ref 43.0–77.0)
Platelets: 161 10*3/uL (ref 150.0–400.0)
RBC: 3.84 Mil/uL — ABNORMAL LOW (ref 3.87–5.11)
RDW: 16.1 % — AB (ref 11.5–15.5)
WBC: 8.8 10*3/uL (ref 4.0–10.5)

## 2014-10-07 LAB — TSH: TSH: 2.54 u[IU]/mL (ref 0.35–4.50)

## 2014-10-07 LAB — VITAMIN D 25 HYDROXY (VIT D DEFICIENCY, FRACTURES): VITD: 23.22 ng/mL — ABNORMAL LOW (ref 30.00–100.00)

## 2014-10-07 MED ORDER — HYDROCODONE-ACETAMINOPHEN 5-325 MG PO TABS: 1.0000 | ORAL_TABLET | Freq: Four times a day (QID) | ORAL | Status: DC | PRN

## 2014-10-07 MED ORDER — SERTRALINE HCL 25 MG PO TABS: 25.0000 mg | ORAL_TABLET | Freq: Every day | ORAL | Status: DC

## 2014-10-07 NOTE — Progress Notes (Signed)
Pre visit review using our clinic review tool, if applicable. No additional management support is needed unless otherwise documented below in the visit note. 

## 2014-10-07 NOTE — Progress Notes (Signed)
   Subjective:    Patient ID: Kelsey Cochran, female    DOB: 08/27/1930, 80 y.o.   MRN: [id number]  HPI Here today for CPE.  Risk Factors: HTN- chronic problem, on Coreg, Spironolactone, Imdur.  No CP, SOB above baseline, HAs, visual changes, edema. Hyperlipidemia- chronic problem, on Fenofibrate and Lipitor.  No abd pain, N/V DM- chronic problem, on Glipizide.  has been intolerant to ACE when added in the past.  UTD on eye exam. Afib- chronic problem, on Coumadin, following w/ Cards. Osteopenia- on Fosamax.  UTD on DEXA. R knee pain- pt slipped and fell on [place] on 11[place]6.  Initially had R knee swelling.  Pain is improving at knee but now having pain in mid-thigh and down to mid calf.  Physical Activity: limited due to hip pain Fall Risk: moderate- walks w/ cane Depression: chronic problem, on Buspar w/ adequate symptom control per daughters' report.  Pt concerned about possible depression w/ holiday season approaching. Hearing: normal to conversational tones, decreased to whispered voice ADL's: independent Cognitive: normal linear thought process, memory and attention intact Home Safety: safe at home, good local family support Height, Weight, BMI, Visual Acuity: see vitals, vision corrected to 20/20 w/ glasses Counseling: UTD on mammo, DEXA, eye exam.  Due for colonoscopy- 'NO'. Health Care POA/Living will- pt has paperwork at the house, nothing on paper but 'family aware'. Labs Ordered: See A&P Care Plan: See A&P    Review of Systems Patient reports no vision/ hearing changes, adenopathy,fever, weight change,  persistant/recurrent hoarseness , swallowing issues, chest pain, palpitations, edema, persistant/recurrent cough, hemoptysis, dyspnea (rest/exertional/paroxysmal nocturnal), gastrointestinal bleeding (melena, rectal bleeding), abdominal pain, significant heartburn, bowel changes, GU symptoms (dysuria, hematuria, incontinence), Gyn symptoms (abnormal  bleeding, pain),  syncope, focal  weakness, memory loss, numbness & tingling, skin/hair/nail changes, abnormal bruising or bleeding.     Objective:   Physical Exam General Appearance:    Alert, cooperative, no distress, appears stated age  Head:    Normocephalic, without obvious abnormality, atraumatic  Eyes:    PERRL, conjunctiva/corneas clear, EOM's intact, fundi    benign, both eyes  Ears:    Normal TM's and external ear canals, both ears  Nose:   Nares normal, septum midline, mucosa normal, no drainage    or sinus tenderness  Throat:   Lips, mucosa, and tongue normal; teeth and gums normal  Neck:   Supple, symmetrical, trachea midline, no adenopathy;    Thyroid: no enlargement/tenderness/nodules  Back:     Symmetric, no curvature, ROM normal, no CVA tenderness  Lungs:     Clear to auscultation bilaterally, respirations unlabored  Chest Wall:    No tenderness or deformity   Heart:    Irregularly irregular  Breast Exam:    Deferred to GYN  Abdomen:     Soft, non-tender, bowel sounds active all four quadrants,    no masses, no organomegaly  Genitalia:    Deferred to GYN  Rectal:    Extremities:   Extremities normal, atraumatic, no cyanosis or edema  Pulses:   2+ and symmetric all extremities  Skin:   Skin color, texture, turgor normal, no rashes or lesions  Lymph nodes:   Cervical, supraclavicular, and axillary nodes normal  Neurologic:   CNII-XII intact, normal strength, sensation and reflexes    throughout          Assessment & Plan:

## 2014-10-07 NOTE — Patient Instructions (Signed)
Follow up in 3-4 months to recheck diabetes and mood We'll notify you of your lab results and make any changes if needed Continue the Buspar twice daily ADD the Sertraline once daily for depression Keep up the good work!  You look great! Call with any questions or concerns Happy Holidays!!!

## 2014-10-08 ENCOUNTER — Encounter: Payer: Self-pay | Admitting: General Practice

## 2014-10-08 ENCOUNTER — Other Ambulatory Visit: Payer: Self-pay | Admitting: General Practice

## 2014-10-08 MED ORDER — POTASSIUM CHLORIDE 20 MEQ PO PACK: 20.0000 meq | PACK | Freq: Once | ORAL | Status: DC

## 2014-10-09 ENCOUNTER — Ambulatory Visit (INDEPENDENT_AMBULATORY_CARE_PROVIDER_SITE_OTHER): Payer: Medicare PPO | Admitting: *Deleted

## 2014-10-09 DIAGNOSIS — I481 Persistent atrial fibrillation: Secondary | ICD-10-CM

## 2014-10-09 DIAGNOSIS — Z9889 Other specified postprocedural states: Secondary | ICD-10-CM

## 2014-10-09 DIAGNOSIS — I4819 Other persistent atrial fibrillation: Secondary | ICD-10-CM

## 2014-10-09 DIAGNOSIS — Z5181 Encounter for therapeutic drug level monitoring: Secondary | ICD-10-CM

## 2014-10-09 DIAGNOSIS — I4891 Unspecified atrial fibrillation: Secondary | ICD-10-CM

## 2014-10-09 DIAGNOSIS — I059 Rheumatic mitral valve disease, unspecified: Secondary | ICD-10-CM

## 2014-10-09 LAB — POCT INR: INR: 2.6

## 2014-10-13 NOTE — Assessment & Plan Note (Signed)
Hx of similar.  Check labs.  Replete prn 

## 2014-10-13 NOTE — Assessment & Plan Note (Signed)
Chronic problem.  On anticoagulation.  Following w/ cards.  Currently asymptomatic.  Will continue to follow along.

## 2014-10-13 NOTE — Assessment & Plan Note (Signed)
Pt's PE unchanged from previous.  UTD on mammo, DEXA, eye exam.  Pt refusing colonoscopy.  Written screening schedule updated and given to pt.  Pt given POA, living will forms to complete and return.  Check labs.  Anticipatory guidance provided.

## 2014-10-13 NOTE — Assessment & Plan Note (Signed)
Chronic problem.  Adequate control.  Asymptomatic.  Check labs.  No anticipated med changes 

## 2014-10-13 NOTE — Assessment & Plan Note (Addendum)
Pt reports anxiety is better but depression is worsening w/ approaching holidays.  Add Zoloft to Buspar.  Will continue to follow.

## 2014-10-13 NOTE — Assessment & Plan Note (Signed)
Chronic problem, intolerant to ACE/ARB.  UTD on eye exam.  Check labs.  Adjust meds prn

## 2014-10-13 NOTE — Assessment & Plan Note (Signed)
Chronic problem.  Tolerating statin w/o difficulty.  Check labs.  Adjust meds prn  

## 2014-10-18 ENCOUNTER — Other Ambulatory Visit: Payer: Self-pay | Admitting: Family Medicine

## 2014-10-18 NOTE — Telephone Encounter (Signed)
Med filled.  

## 2014-10-22 ENCOUNTER — Other Ambulatory Visit: Payer: Self-pay | Admitting: Cardiovascular Disease

## 2014-10-24 ENCOUNTER — Ambulatory Visit (INDEPENDENT_AMBULATORY_CARE_PROVIDER_SITE_OTHER): Payer: Medicare PPO

## 2014-10-24 ENCOUNTER — Other Ambulatory Visit: Payer: Self-pay | Admitting: Family Medicine

## 2014-10-24 DIAGNOSIS — Z9889 Other specified postprocedural states: Secondary | ICD-10-CM

## 2014-10-24 DIAGNOSIS — I481 Persistent atrial fibrillation: Secondary | ICD-10-CM

## 2014-10-24 DIAGNOSIS — I059 Rheumatic mitral valve disease, unspecified: Secondary | ICD-10-CM

## 2014-10-24 DIAGNOSIS — I4819 Other persistent atrial fibrillation: Secondary | ICD-10-CM

## 2014-10-24 DIAGNOSIS — Z5181 Encounter for therapeutic drug level monitoring: Secondary | ICD-10-CM

## 2014-10-24 DIAGNOSIS — I4891 Unspecified atrial fibrillation: Secondary | ICD-10-CM

## 2014-10-24 LAB — POCT INR: INR: 2

## 2014-10-24 NOTE — Telephone Encounter (Signed)
Med filled.  

## 2014-10-31 ENCOUNTER — Other Ambulatory Visit: Payer: Self-pay | Admitting: Cardiovascular Disease

## 2014-11-06 ENCOUNTER — Ambulatory Visit (INDEPENDENT_AMBULATORY_CARE_PROVIDER_SITE_OTHER): Payer: Medicare PPO | Admitting: *Deleted

## 2014-11-06 DIAGNOSIS — Z9889 Other specified postprocedural states: Secondary | ICD-10-CM

## 2014-11-06 DIAGNOSIS — I4891 Unspecified atrial fibrillation: Secondary | ICD-10-CM

## 2014-11-06 DIAGNOSIS — I059 Rheumatic mitral valve disease, unspecified: Secondary | ICD-10-CM

## 2014-11-06 DIAGNOSIS — I4819 Other persistent atrial fibrillation: Secondary | ICD-10-CM

## 2014-11-06 DIAGNOSIS — Z5181 Encounter for therapeutic drug level monitoring: Secondary | ICD-10-CM

## 2014-11-06 DIAGNOSIS — I481 Persistent atrial fibrillation: Secondary | ICD-10-CM

## 2014-11-06 LAB — POCT INR: INR: 4.9

## 2014-11-12 ENCOUNTER — Encounter: Payer: Self-pay | Admitting: Internal Medicine

## 2014-11-12 ENCOUNTER — Ambulatory Visit (HOSPITAL_BASED_OUTPATIENT_CLINIC_OR_DEPARTMENT_OTHER)
Admission: RE | Admit: 2014-11-12 | Discharge: 2014-11-12 | Disposition: A | Discharge status: Home / Self Care | Payer: Medicare PPO | Source: Ambulatory Visit | Attending: Internal Medicine | Admitting: Internal Medicine

## 2014-11-12 ENCOUNTER — Ambulatory Visit (INDEPENDENT_AMBULATORY_CARE_PROVIDER_SITE_OTHER): Payer: Medicare PPO | Admitting: Internal Medicine

## 2014-11-12 VITALS — BP 114/68 | HR 64 | Temp 97.5°F | Wt 158.0 lb

## 2014-11-12 DIAGNOSIS — Z952 Presence of prosthetic heart valve: Secondary | ICD-10-CM | POA: Insufficient documentation

## 2014-11-12 DIAGNOSIS — R059 Cough, unspecified: Secondary | ICD-10-CM

## 2014-11-12 DIAGNOSIS — R05 Cough: Secondary | ICD-10-CM

## 2014-11-12 DIAGNOSIS — I509 Heart failure, unspecified: Secondary | ICD-10-CM | POA: Insufficient documentation

## 2014-11-12 DIAGNOSIS — I272 Other secondary pulmonary hypertension: Secondary | ICD-10-CM | POA: Diagnosis not present

## 2014-11-12 MED ORDER — ALBUTEROL SULFATE HFA 108 (90 BASE) MCG/ACT IN AERS: 2.0000 | INHALATION_SPRAY | Freq: Four times a day (QID) | RESPIRATORY_TRACT | Status: DC

## 2014-11-12 MED ORDER — CEFPROZIL 500 MG PO TABS: 500.0000 mg | ORAL_TABLET | Freq: Two times a day (BID) | ORAL | Status: DC

## 2014-11-12 NOTE — Progress Notes (Signed)
Pre visit review using our clinic review tool, if applicable. No additional management support is needed unless otherwise documented below in the visit note. 

## 2014-11-12 NOTE — Patient Instructions (Signed)
Stop by the first floor and get the XR   Take the antibiotic as prescribed Robitussin-DM as needed for cough Hydrocodone if the cough is persistent Albuterol 2 puffs 4 times a day as needed for wheezing and chest congestion Rest, fluids, Tylenol Call if you're not improving in the next few days Call anytime if you get worse.

## 2014-11-12 NOTE — Progress Notes (Signed)
Subjective:    Patient ID: Kelsey Cochran, female    DOB: 04/27/1930, 80 y.o.   MRN: [id number]  DOS:  11/12/2014 Type of visit - description : acute, here w/ her daughter Interval history: Sx started about 9 days ago with cough, occasionally produces clear sputum. She takes a number of medicines including Coumadin and has been afraid of taking any OTCs.   ROS No fever chills, low-grade temp yesterday? No sinus pain or congestion No chest pain, lower extremity orthopnea.   she did noted some wheezing, denies any history of asthma, COPD or previous tobacco abuse  Past Medical History  Diagnosis Date  . S/P MVR (mitral valve replacement) 2005    [facility] 2005  [facility]  . Diastolic CHF, chronic   . Hypertension   . Pulmonary hypertension   . Arthritis   . TIA (transient ischemic attack)   . Diabetes mellitus   . Hypercholesterolemia   . TIA (transient ischemic attack)   . Anemia   . CRF (chronic renal failure)   . S/P tricuspid valve repair 2005    [facility]    Past Surgical History  Procedure Laterality Date  . Knee arthroscopy      06/2007- Right knee Dr. [place]  . Mitral valve replacement      TV annuloplasty- [id number]  . Shoulder surgery      rotato cuff 02/2006 [facility]  . Abdominal hysterectomy    . Hip fracture surgery      L hip  . Tonsillectomy    . Appendectomy    . Hernia repair      History   Social History  . Marital Status: Widowed    Spouse Name: N/A    Number of Children: N/A  . Years of Education: N/A   Occupational History  . Retired     [employer] fundraiser[employer]   Social History Main Topics  . Smoking status: Never Smoker   . Smokeless tobacco: Never Used  . Alcohol Use: No  . Drug Use: No  . Sexual Activity: Not on file   Other Topics Concern  . Not on file   Social History Narrative        Medication List       This list is accurate as of: 11/12/14  9:07 PM.  Always use your most recent med list.               albuterol  108 (90 BASE) MCG/ACT inhaler  Commonly known as:  VENTOLIN HFA  Inhale 2 puffs into the lungs 4 (four) times daily.     alendronate 35 MG tablet  Commonly known as:  FOSAMAX  TAKE 1 TABLET EVERY 7 DAYS IN THE MORNING AS DIRECTED. SEE PACKAGE FOR ADDITIONAL INSTRUCTIONS     amoxicillin 500 MG capsule  Commonly known as:  AMOXIL  Take 500 mg by mouth. Takes 4 capsules prior to Dental appt     aspirin 81 MG tablet  Take 81 mg by mouth daily.     atorvastatin 20 MG tablet  Commonly known as:  LIPITOR  TAKE 1 TABLET BY MOUTH EVERY DAY     busPIRone 7.5 MG tablet  Commonly known as:  BUSPAR  TAKE 1 TABLET BY MOUTH TWICE DAILY     carvedilol 25 MG tablet  Commonly known as:  COREG  TAKE 1/2 TABLET EVERY DAY     cefPROZIL 500 MG tablet  Commonly known as:  CEFZIL  Take 1 tablet (  500 mg total) by mouth 2 (two) times daily.     chlorhexidine 0.12 % solution  Commonly known as:  PERIDEX  Use as directed in the mouth or throat.     COLACE 100 MG capsule  Generic drug:  docusate sodium  Take 100 mg by mouth 2 (two) times daily.     fenofibrate 160 MG tablet  TAKE 1 TABLET EVERY DAY     folic acid 800 MCG tablet  Commonly known as:  FOLVITE  Take 400 mcg by mouth daily.     furosemide 80 MG tablet  Commonly known as:  LASIX  TAKE 1 TABLET TWICE DAILY     glipiZIDE 2.5 MG 24 hr tablet  Commonly known as:  GLUCOTROL XL  TAKE 1 TABLET BY MOUTH EVERY DAY     HYDROcodone-acetaminophen 5-325 MG per tablet  Commonly known as:  NORCO/VICODIN  Take 1 tablet by mouth every 6 (six) hours as needed.     isosorbide mononitrate 30 MG 24 hr tablet  Commonly known as:  IMDUR  TAKE 1 TABLET EVERY DAY     methocarbamol 500 MG tablet  Commonly known as:  ROBAXIN  TAKE 1 TABLET BY MOUTH TWICE DAILY AS NEEDED     potassium chloride 20 MEQ packet  Commonly known as:  KLOR-CON  Take 20 mEq by mouth once.     sertraline 25 MG tablet  Commonly known as:  ZOLOFT  Take 1 tablet (25 mg  total) by mouth daily.     spironolactone 25 MG tablet  Commonly known as:  ALDACTONE  TAKE 1 TABLET EVERY DAY     TRUETEST TEST test strip  Generic drug:  glucose blood  TEST ONCE DAILY     TYLENOL PM EXTRA STRENGTH PO  Take by mouth. As needed     Vitamin D (Ergocalciferol) 50000 UNITS Caps capsule  Commonly known as:  DRISDOL  Take 1 capsule (50,000 Units total) by mouth every 7 (seven) days.     warfarin 2.5 MG tablet  Commonly known as:  COUMADIN  TAKE AS DIRECTED           Objective:   Physical Exam BP 114/68 mmHg  Pulse 64  Temp(Src) 97.5 F (36.4 C) (Oral)  Wt 158 lb (71.668 kg)  SpO2 92% General -- alert, well-developed, NAD.  HEENT-- Not pale.  R Ear-- normal L ear-- normal Throat symmetric, no redness or discharge. Face symmetric, sinuses not tender to palpation. Nose not congested.  Lungs -- normal respiratory effort, no intercostal retractions, no accessory muscle use; + rhonchi bilaterally and scattered wheezing. No crackles. Heart--   Rate seem normal today, regular rhythm, no murmur.   Extremities-- no pretibial edema bilaterally  Neurologic--  alert & oriented X3. Speech normal, gait appropriate for age, strength symmetric and appropriate for age.  Psych-- Cognition and judgment appear intact. Cooperative with normal attention span and concentration. No anxious or depressed appearing.        Assessment & Plan:  Cough Cough and bronchospasm, no history of asthma or COPD. She is not in any acute distress. Plan: Chest x-ray, antibiotics Also recommend albuterol  . She will call me if albuterol causes palpitations. See  instructions

## 2014-11-14 ENCOUNTER — Ambulatory Visit (INDEPENDENT_AMBULATORY_CARE_PROVIDER_SITE_OTHER): Payer: Medicare PPO | Admitting: *Deleted

## 2014-11-14 DIAGNOSIS — Z5181 Encounter for therapeutic drug level monitoring: Secondary | ICD-10-CM

## 2014-11-14 DIAGNOSIS — I059 Rheumatic mitral valve disease, unspecified: Secondary | ICD-10-CM

## 2014-11-14 DIAGNOSIS — I4819 Other persistent atrial fibrillation: Secondary | ICD-10-CM

## 2014-11-14 DIAGNOSIS — Z9889 Other specified postprocedural states: Secondary | ICD-10-CM

## 2014-11-14 DIAGNOSIS — I4891 Unspecified atrial fibrillation: Secondary | ICD-10-CM

## 2014-11-14 DIAGNOSIS — I481 Persistent atrial fibrillation: Secondary | ICD-10-CM

## 2014-11-14 LAB — POCT INR: INR: 1.8

## 2014-11-15 ENCOUNTER — Other Ambulatory Visit: Payer: Self-pay | Admitting: Cardiovascular Disease

## 2014-11-25 ENCOUNTER — Ambulatory Visit (INDEPENDENT_AMBULATORY_CARE_PROVIDER_SITE_OTHER): Payer: Medicare PPO

## 2014-11-25 DIAGNOSIS — Z9889 Other specified postprocedural states: Secondary | ICD-10-CM

## 2014-11-25 DIAGNOSIS — Z5181 Encounter for therapeutic drug level monitoring: Secondary | ICD-10-CM

## 2014-11-25 DIAGNOSIS — I059 Rheumatic mitral valve disease, unspecified: Secondary | ICD-10-CM

## 2014-11-25 DIAGNOSIS — I4891 Unspecified atrial fibrillation: Secondary | ICD-10-CM

## 2014-11-25 LAB — POCT INR: INR: 1.5

## 2014-12-13 ENCOUNTER — Ambulatory Visit (INDEPENDENT_AMBULATORY_CARE_PROVIDER_SITE_OTHER): Payer: Medicare PPO | Admitting: *Deleted

## 2014-12-13 DIAGNOSIS — I4891 Unspecified atrial fibrillation: Secondary | ICD-10-CM

## 2014-12-13 DIAGNOSIS — Z9889 Other specified postprocedural states: Secondary | ICD-10-CM

## 2014-12-13 DIAGNOSIS — I059 Rheumatic mitral valve disease, unspecified: Secondary | ICD-10-CM

## 2014-12-13 DIAGNOSIS — Z5181 Encounter for therapeutic drug level monitoring: Secondary | ICD-10-CM

## 2014-12-13 LAB — POCT INR: INR: 3.9

## 2014-12-26 ENCOUNTER — Ambulatory Visit (INDEPENDENT_AMBULATORY_CARE_PROVIDER_SITE_OTHER): Payer: Medicare PPO | Admitting: *Deleted

## 2014-12-26 DIAGNOSIS — Z9889 Other specified postprocedural states: Secondary | ICD-10-CM

## 2014-12-26 DIAGNOSIS — I059 Rheumatic mitral valve disease, unspecified: Secondary | ICD-10-CM

## 2014-12-26 DIAGNOSIS — Z5181 Encounter for therapeutic drug level monitoring: Secondary | ICD-10-CM

## 2014-12-26 DIAGNOSIS — I4891 Unspecified atrial fibrillation: Secondary | ICD-10-CM

## 2014-12-26 LAB — POCT INR: INR: 2.5

## 2014-12-30 ENCOUNTER — Other Ambulatory Visit: Payer: Self-pay | Admitting: Cardiology

## 2015-01-07 ENCOUNTER — Other Ambulatory Visit: Payer: Self-pay | Admitting: Family Medicine

## 2015-01-07 NOTE — Telephone Encounter (Signed)
Med filled.  

## 2015-01-09 ENCOUNTER — Other Ambulatory Visit: Payer: Self-pay | Admitting: Family Medicine

## 2015-01-10 NOTE — Telephone Encounter (Signed)
Med filled.  

## 2015-01-16 ENCOUNTER — Other Ambulatory Visit: Payer: Self-pay | Admitting: Family Medicine

## 2015-01-16 ENCOUNTER — Ambulatory Visit (INDEPENDENT_AMBULATORY_CARE_PROVIDER_SITE_OTHER): Payer: Medicare PPO | Admitting: *Deleted

## 2015-01-16 DIAGNOSIS — I059 Rheumatic mitral valve disease, unspecified: Secondary | ICD-10-CM

## 2015-01-16 DIAGNOSIS — Z5181 Encounter for therapeutic drug level monitoring: Secondary | ICD-10-CM

## 2015-01-16 DIAGNOSIS — I4891 Unspecified atrial fibrillation: Secondary | ICD-10-CM

## 2015-01-16 DIAGNOSIS — Z9889 Other specified postprocedural states: Secondary | ICD-10-CM

## 2015-01-16 LAB — POCT INR: INR: 3.7

## 2015-01-16 NOTE — Telephone Encounter (Signed)
Med filled.  

## 2015-01-25 ENCOUNTER — Other Ambulatory Visit: Payer: Self-pay | Admitting: Family Medicine

## 2015-01-27 NOTE — Telephone Encounter (Signed)
Med filled.  

## 2015-01-28 ENCOUNTER — Other Ambulatory Visit: Payer: Self-pay | Admitting: Family Medicine

## 2015-01-31 ENCOUNTER — Other Ambulatory Visit: Payer: Self-pay | Admitting: Family Medicine

## 2015-01-31 NOTE — Telephone Encounter (Signed)
Med filled.  

## 2015-02-04 ENCOUNTER — Other Ambulatory Visit: Payer: Self-pay | Admitting: Radiology

## 2015-02-04 DIAGNOSIS — I6523 Occlusion and stenosis of bilateral carotid arteries: Secondary | ICD-10-CM

## 2015-02-05 ENCOUNTER — Ambulatory Visit: Payer: Medicare PPO | Admitting: Family Medicine

## 2015-02-06 ENCOUNTER — Ambulatory Visit (INDEPENDENT_AMBULATORY_CARE_PROVIDER_SITE_OTHER): Payer: Medicare PPO | Admitting: *Deleted

## 2015-02-06 DIAGNOSIS — Z9889 Other specified postprocedural states: Secondary | ICD-10-CM

## 2015-02-06 DIAGNOSIS — I059 Rheumatic mitral valve disease, unspecified: Secondary | ICD-10-CM

## 2015-02-06 DIAGNOSIS — I4891 Unspecified atrial fibrillation: Secondary | ICD-10-CM | POA: Diagnosis not present

## 2015-02-06 DIAGNOSIS — Z5181 Encounter for therapeutic drug level monitoring: Secondary | ICD-10-CM

## 2015-02-06 LAB — POCT INR: INR: 2.7

## 2015-02-10 ENCOUNTER — Ambulatory Visit: Payer: Medicare PPO | Admitting: Family Medicine

## 2015-02-12 ENCOUNTER — Ambulatory Visit (INDEPENDENT_AMBULATORY_CARE_PROVIDER_SITE_OTHER): Payer: Medicare PPO | Admitting: Family Medicine

## 2015-02-12 ENCOUNTER — Encounter: Payer: Self-pay | Admitting: Family Medicine

## 2015-02-12 VITALS — BP 110/78 | HR 58 | Temp 97.6°F | Resp 18 | Ht 64.0 in | Wt 153.4 lb

## 2015-02-12 DIAGNOSIS — E1151 Type 2 diabetes mellitus with diabetic peripheral angiopathy without gangrene: Secondary | ICD-10-CM

## 2015-02-12 DIAGNOSIS — E1159 Type 2 diabetes mellitus with other circulatory complications: Secondary | ICD-10-CM | POA: Diagnosis not present

## 2015-02-12 DIAGNOSIS — R5383 Other fatigue: Secondary | ICD-10-CM

## 2015-02-12 DIAGNOSIS — F32A Depression, unspecified: Secondary | ICD-10-CM

## 2015-02-12 DIAGNOSIS — F329 Major depressive disorder, single episode, unspecified: Secondary | ICD-10-CM | POA: Diagnosis not present

## 2015-02-12 LAB — CBC WITH DIFFERENTIAL/PLATELET
BASOS ABS: 0 10*3/uL (ref 0.0–0.1)
Basophils Relative: 0.2 % (ref 0.0–3.0)
EOS ABS: 0.4 10*3/uL (ref 0.0–0.7)
EOS PCT: 5.1 % — AB (ref 0.0–5.0)
HCT: 37.5 % (ref 36.0–46.0)
Hemoglobin: 12.6 g/dL (ref 12.0–15.0)
LYMPHS ABS: 0.7 10*3/uL (ref 0.7–4.0)
Lymphocytes Relative: 8.9 % — ABNORMAL LOW (ref 12.0–46.0)
MCHC: 33.7 g/dL (ref 30.0–36.0)
MCV: 96.2 fl (ref 78.0–100.0)
Monocytes Absolute: 0.3 10*3/uL (ref 0.1–1.0)
Monocytes Relative: 3.4 % (ref 3.0–12.0)
NEUTROS PCT: 82.4 % — AB (ref 43.0–77.0)
Neutro Abs: 6.5 10*3/uL (ref 1.4–7.7)
Platelets: 142 10*3/uL — ABNORMAL LOW (ref 150.0–400.0)
RBC: 3.89 Mil/uL (ref 3.87–5.11)
RDW: 15.8 % — AB (ref 11.5–15.5)
WBC: 7.8 10*3/uL (ref 4.0–10.5)

## 2015-02-12 LAB — BASIC METABOLIC PANEL
BUN: 24 mg/dL — ABNORMAL HIGH (ref 6–23)
CALCIUM: 9.8 mg/dL (ref 8.4–10.5)
CO2: 31 mEq/L (ref 19–32)
Chloride: 100 mEq/L (ref 96–112)
Creatinine, Ser: 1.62 mg/dL — ABNORMAL HIGH (ref 0.40–1.20)
GFR: 32.3 mL/min — AB (ref >60.00)
Glucose, Bld: 94 mg/dL (ref 70–99)
POTASSIUM: 3.5 meq/L (ref 3.5–5.1)
Sodium: 140 mEq/L (ref 135–145)

## 2015-02-12 LAB — TSH: TSH: 2.81 u[IU]/mL (ref 0.35–4.50)

## 2015-02-12 LAB — HEMOGLOBIN A1C: HEMOGLOBIN A1C: 6.1 % (ref 4.6–6.5)

## 2015-02-12 MED ORDER — VITAMIN D (ERGOCALCIFEROL) 1.25 MG (50000 UNIT) PO CAPS: 50000.0000 [IU] | ORAL_CAPSULE | ORAL | Status: DC

## 2015-02-12 NOTE — Progress Notes (Signed)
   Subjective:    Patient ID: Kelsey Cochran, female    DOB: 09/12/1930, 81 y.o.   MRN: [id number]  HPI DM- chronic problem.  On Glipizide.  UTD on diabetic foot exam, eye exam, microalbumin.  Not on ACE/ARB due to renal insufficiency.  Denies symptomatic lows.  Fasting CBGs 'are seldom over 100'.  No CP, SOB above baseline, visual changes, abd pain, N/V.  Depression- chronic problem.  Pt continues her Buspar.  Zoloft was added at last visit.  Pt feels 'it may have helped some' but having difficult time deciding if things have improved b/c of how she physically feels.  Fatigue- ongoing issue.  Pt had bronchitis over the holidays and doesn't feel she recovered completely.  'oh I feel bad'.  No particular symptoms.  Has not been taking prescription Vit D due to pharmacy confusion.  Pt would like to stop taking K+ due to size of pill.   Review of Systems For ROS see HPI     Objective:   Physical Exam  Constitutional: She is oriented to person, place, and time. She appears well-developed and well-nourished. No distress.  HENT:  Head: Normocephalic and atraumatic.  Eyes: Conjunctivae and EOM are normal. Pupils are equal, round, and reactive to light.  Neck: Normal range of motion. Neck supple. No thyromegaly present.  Cardiovascular: Normal rate, normal heart sounds and intact distal pulses.   Irregularly irregular S1/S2  Pulmonary/Chest: Effort normal and breath sounds normal. No respiratory distress.  Abdominal: Soft. She exhibits no distension. There is no tenderness.  Musculoskeletal: She exhibits no edema.  Lymphadenopathy:    She has no cervical adenopathy.  Neurological: She is alert and oriented to person, place, and time.  Skin: Skin is warm and dry.  Psychiatric: She has a normal mood and affect. Her behavior is normal.  Vitals reviewed.         Assessment & Plan:

## 2015-02-12 NOTE — Assessment & Plan Note (Signed)
Chronic problem.  Fasting CBGs are excellent.  UTD on eye exam, foot exam, microalbumin.  Intolerant to ACE/ARB due to renal insufficiency.  Check labs.  Adjust meds prn

## 2015-02-12 NOTE — Assessment & Plan Note (Signed)
Recurrent problem for pt.  She reports she has not felt well since she was ill over the holidays.  Will check labs to r/o thyroid abnormality, anemia, electrolyte abnormality.  If no obvious cause, and sxs don't improve, pt to call so we can do further investigation.  Pt expressed understanding and is in agreement w/ plan.

## 2015-02-12 NOTE — Patient Instructions (Signed)
Follow up in 3-4 months to recheck diabetes and cholesterol We'll notify you of your lab results and make any changes if needed If fatigue is not better in the next few weeks- call me! Call with any questions or concerns Hang in there! Happy Spring!

## 2015-02-12 NOTE — Progress Notes (Signed)
Pre visit review using our clinic review tool, if applicable. No additional management support is needed unless otherwise documented below in the visit note. 

## 2015-02-12 NOTE — Assessment & Plan Note (Addendum)
Chronic problem.  Improved w/ addition of Zoloft to the Buspar that she is taking.  Pt is complaining of fatigue which could be related to her medication but she initially felt better so this is less likely.

## 2015-02-14 ENCOUNTER — Telehealth: Payer: Self-pay | Admitting: *Deleted

## 2015-02-14 DIAGNOSIS — R7989 Other specified abnormal findings of blood chemistry: Secondary | ICD-10-CM

## 2015-02-14 DIAGNOSIS — D72829 Elevated white blood cell count, unspecified: Secondary | ICD-10-CM

## 2015-02-14 NOTE — Telephone Encounter (Signed)
Notified pt and she voices understanding. Lab appt scheduled for 02/28/15 at 10:45am. Future orders entered and results mailed to pt at her request.

## 2015-02-14 NOTE — Telephone Encounter (Signed)
-----   Message from Sheliah HatchKatherine E Tabori, MD sent at 02/13/2015  4:50 PM EDT ----- A1C looks great! Kidney function is slightly worse than last check- increase water intake and repeat BMP in 2 weeks (dx elevated Cr) No evidence of infection but Neutrophils (type of WBC) are slightly elevated.  Based on this, we will repeat CBC in 2 weeks (both labs at lab only visit)

## 2015-02-17 ENCOUNTER — Ambulatory Visit (HOSPITAL_COMMUNITY): Payer: Medicare PPO | Attending: Cardiovascular Disease | Admitting: Cardiology

## 2015-02-17 DIAGNOSIS — I6523 Occlusion and stenosis of bilateral carotid arteries: Secondary | ICD-10-CM | POA: Diagnosis not present

## 2015-02-17 NOTE — Progress Notes (Signed)
Carotid duplex performed 

## 2015-02-26 ENCOUNTER — Other Ambulatory Visit: Payer: Self-pay | Admitting: Family Medicine

## 2015-02-26 NOTE — Telephone Encounter (Signed)
Med filled.  

## 2015-02-28 ENCOUNTER — Encounter: Payer: Self-pay | Admitting: General Practice

## 2015-02-28 ENCOUNTER — Other Ambulatory Visit (INDEPENDENT_AMBULATORY_CARE_PROVIDER_SITE_OTHER): Payer: Medicare PPO

## 2015-02-28 ENCOUNTER — Other Ambulatory Visit: Payer: Self-pay | Admitting: Family Medicine

## 2015-02-28 DIAGNOSIS — D72829 Elevated white blood cell count, unspecified: Secondary | ICD-10-CM

## 2015-02-28 DIAGNOSIS — R7989 Other specified abnormal findings of blood chemistry: Secondary | ICD-10-CM

## 2015-02-28 DIAGNOSIS — R748 Abnormal levels of other serum enzymes: Secondary | ICD-10-CM | POA: Diagnosis not present

## 2015-02-28 LAB — CBC WITH DIFFERENTIAL/PLATELET
Basophils Absolute: 0 10*3/uL (ref 0.0–0.1)
Basophils Relative: 0.4 % (ref 0.0–3.0)
EOS ABS: 0.3 10*3/uL (ref 0.0–0.7)
Eosinophils Relative: 5.9 % — ABNORMAL HIGH (ref 0.0–5.0)
HCT: 35.3 % — ABNORMAL LOW (ref 36.0–46.0)
Hemoglobin: 11.8 g/dL — ABNORMAL LOW (ref 12.0–15.0)
Lymphocytes Relative: 11.4 % — ABNORMAL LOW (ref 12.0–46.0)
Lymphs Abs: 0.7 10*3/uL (ref 0.7–4.0)
MCHC: 33.4 g/dL (ref 30.0–36.0)
MCV: 97.4 fl (ref 78.0–100.0)
MONOS PCT: 6.6 % (ref 3.0–12.0)
Monocytes Absolute: 0.4 10*3/uL (ref 0.1–1.0)
NEUTROS PCT: 75.7 % (ref 43.0–77.0)
Neutro Abs: 4.5 10*3/uL (ref 1.4–7.7)
Platelets: 157 10*3/uL (ref 150.0–400.0)
RBC: 3.63 Mil/uL — AB (ref 3.87–5.11)
RDW: 15.8 % — AB (ref 11.5–15.5)
WBC: 5.9 10*3/uL (ref 4.0–10.5)

## 2015-02-28 LAB — BASIC METABOLIC PANEL
BUN: 24 mg/dL — ABNORMAL HIGH (ref 6–23)
CHLORIDE: 97 meq/L (ref 96–112)
CO2: 29 mEq/L (ref 19–32)
Calcium: 9.4 mg/dL (ref 8.4–10.5)
Creatinine, Ser: 1.89 mg/dL — ABNORMAL HIGH (ref 0.40–1.20)
GFR: 27.03 mL/min — AB (ref >60.00)
Glucose, Bld: 138 mg/dL — ABNORMAL HIGH (ref 70–99)
POTASSIUM: 4 meq/L (ref 3.5–5.1)
SODIUM: 137 meq/L (ref 135–145)

## 2015-03-13 ENCOUNTER — Ambulatory Visit (INDEPENDENT_AMBULATORY_CARE_PROVIDER_SITE_OTHER): Payer: Medicare PPO | Admitting: Pharmacist

## 2015-03-13 DIAGNOSIS — Z9889 Other specified postprocedural states: Secondary | ICD-10-CM | POA: Diagnosis not present

## 2015-03-13 DIAGNOSIS — Z5181 Encounter for therapeutic drug level monitoring: Secondary | ICD-10-CM

## 2015-03-13 DIAGNOSIS — I4891 Unspecified atrial fibrillation: Secondary | ICD-10-CM | POA: Diagnosis not present

## 2015-03-13 DIAGNOSIS — I059 Rheumatic mitral valve disease, unspecified: Secondary | ICD-10-CM

## 2015-03-13 LAB — POCT INR: INR: 2.3

## 2015-03-14 ENCOUNTER — Other Ambulatory Visit: Payer: Self-pay | Admitting: Adult Health

## 2015-03-14 ENCOUNTER — Other Ambulatory Visit (INDEPENDENT_AMBULATORY_CARE_PROVIDER_SITE_OTHER): Payer: Medicare PPO

## 2015-03-14 ENCOUNTER — Encounter: Payer: Self-pay | Admitting: General Practice

## 2015-03-14 DIAGNOSIS — R748 Abnormal levels of other serum enzymes: Secondary | ICD-10-CM

## 2015-03-14 DIAGNOSIS — R7989 Other specified abnormal findings of blood chemistry: Secondary | ICD-10-CM

## 2015-03-14 LAB — BASIC METABOLIC PANEL
BUN: 20 mg/dL (ref 6–23)
CHLORIDE: 99 meq/L (ref 96–112)
CO2: 27 meq/L (ref 19–32)
Calcium: 9.1 mg/dL (ref 8.4–10.5)
Creatinine, Ser: 1.69 mg/dL — ABNORMAL HIGH (ref 0.40–1.20)
GFR: 30.76 mL/min — ABNORMAL LOW (ref >60.00)
Glucose, Bld: 135 mg/dL — ABNORMAL HIGH (ref 70–99)
Potassium: 3.5 mEq/L (ref 3.5–5.1)
SODIUM: 139 meq/L (ref 135–145)

## 2015-03-16 NOTE — Progress Notes (Signed)
Patient ID: Kelsey Cochran, female   DOB: 06/20/1930, 81 y.o.   MRN: [id number] Kelsey Cochran is seen today for F/U of dyspnea, anticoagulaiton and MVR and carotid disease. Reviewed duplex from 9/13 and 60-79% RICA stenosis stable. . She has significant anemia with Hct 24 in past most recent 4/15 32 improved . I reviewed her echo from 3/8 and EF normal with normal functioning MVR no evidence of perivalvular leak or other abnormality that could contribute to anemia via shearing effect/hemolysis. She apparantly has had Aranasp shots and F/U with Dr Myna HidalgoEnnever but I don't have these notes. Tried on lisinopril for proteinuria but could not tolerate for light headedness. Encouraged her to F/U with Dr Beverely Lowabori to find low dose substitute Recently put on fenofibrate for tryglycerides and INR elevated Following with coumadin clinic to adjust dose and better.  Seen by Doctor Delford FieldWright and with PA pressure in mid 50's presumed secondary to MV disease offerred cath and ? Rx for pulmonary hypertension but she declined.   Echo 9/15 with normal appearing MVR but severe pulmonary hypertension  Study Conclusions  - Left ventricle: The cavity size was normal. Systolic function was  normal. The estimated ejection fraction was in the range of 55% to 60%. Wall motion was normal; there were no regional wall motion abnormalities. - Aortic valve: Trileaflet; mildly thickened leaflets. - Mitral valve: A mechanical prosthesis was present and functioning normally. Valve area by continuity equation (using LVOT flow): 1.45 cm^2. - Left atrium: The atrium was mildly dilated. - Tricuspid valve: There was mild-moderate regurgitation. - Pulmonic valve: There was trivial regurgitation. - Pulmonary arteries: PA peak pressure: 85 mm Hg (S).  Impressions:  - The right ventricular systolic pressure was increased consistent with severe pulmonary hypertension.  02/18/15    RICA 60% stenosis  LICA 40-59% 08/20/14  Myovue was normal    She was started on Buspar with some lightening of her mood.  Discussed pro's/cons of right heart cath and she prefers not to have it again She is on beta blocker to maximize diastolic filling period, diuretic to lower filling pressures  And imdur as vasodilator as she cannot afford Any of the specific pulmonary vasodilators    ROS: Denies fever, malais, weight loss, blurry vision, decreased visual acuity, cough, sputum, SOB, hemoptysis, pleuritic pain, palpitaitons, heartburn, abdominal pain, melena, lower extremity edema, claudication, or rash.  All other systems reviewed and negative  General: Still some depressed  Healthy:  appears stated age HEENT: normal Neck supple with no adenopathy JVP normal no bruits no thyromegaly Lungs clear with no wheezing and good diaphragmatic motion Heart:  S1 click /S2 no murmur, no rub, gallop or click PMI normal Abdomen: benighn, BS positve, no tenderness, no AAA no bruit.  No HSM or HJR Distal pulses intact with no bruits No edema Neuro non-focal Skin warm and dry No muscular weakness   Current Outpatient Prescriptions  Medication Sig Dispense Refill  . alendronate (FOSAMAX) 35 MG tablet TAKE 1 TABLET EVERY 7 DAYS IN THE MORNING AS DIRECTED. SEE PACKAGE FOR ADDITIONAL INSTRUCTIONS 12 tablet 3  . aspirin 81 MG tablet Take 81 mg by mouth daily.      Marland Kitchen. atorvastatin (LIPITOR) 20 MG tablet TAKE 1 TABLET EVERY DAY 90 tablet 1  . busPIRone (BUSPAR) 7.5 MG tablet TAKE 1 TABLET BY MOUTH TWICE DAILY 60 tablet 3  . carvedilol (COREG) 25 MG tablet TAKE 1/2 TABLET EVERY DAY 45 tablet 3  . Cholecalciferol (VITAMIN D3) 5000 UNITS CAPS Take 1  capsule by mouth daily.    . Diphenhydramine-APAP, sleep, (TYLENOL PM EXTRA STRENGTH PO) Take by mouth. As needed     . docusate sodium (COLACE) 100 MG capsule Take 100 mg by mouth 2 (two) times daily.      . fenofibrate 160 MG tablet TAKE 1 TABLET EVERY DAY 90 tablet 1  . folic acid (FOLVITE) 800 MCG tablet Take  400 mcg by mouth daily.      . furosemide (LASIX) 80 MG tablet TAKE 1 TABLET TWICE DAILY 180 tablet 1  . glipiZIDE (GLUCOTROL XL) 2.5 MG 24 hr tablet TAKE 1 TABLET BY MOUTH EVERY DAY 30 tablet 6  . glucose blood (TRUETEST TEST) test strip . Dx E11.9 100 each 6  . HYDROcodone-acetaminophen (NORCO/VICODIN) 5-325 MG per tablet Take 1 tablet by mouth every 6 (six) hours as needed. 90 tablet 0  . isosorbide mononitrate (IMDUR) 30 MG 24 hr tablet TAKE 1 TABLET EVERY DAY 90 tablet 0  . methocarbamol (ROBAXIN) 500 MG tablet TAKE 1 TABLET BY MOUTH TWICE DAILY AS NEEDED 60 tablet 0  . potassium chloride (KLOR-CON) 20 MEQ packet Take 20 mEq by mouth once. 30 packet 6  . sertraline (ZOLOFT) 25 MG tablet TAKE 1 TABLET BY MOUTH EVERY DAY 30 tablet 3  . spironolactone (ALDACTONE) 25 MG tablet TAKE 1 TABLET EVERY DAY 90 tablet 3  . Vitamin D, Ergocalciferol, (DRISDOL) 50000 UNITS CAPS capsule Take 1 capsule (50,000 Units total) by mouth every 7 (seven) days. 12 capsule 0  . warfarin (COUMADIN) 2.5 MG tablet TAKE AS DIRECTED 35 tablet 3  . [DISCONTINUED] glyBURIDE (DIABETA) 5 MG tablet Take 1 tablet (5 mg total) by mouth 2 (two) times daily. 180 tablet 0   No current facility-administered medications for this visit.    Allergies  Heparin; Meperidine hcl; and Sulfamethoxazole-trimethoprim  Electrocardiogram:  afib rate 76  Poor R wave progression RAD   Assessment and Plan MVR:  Normal exam and echo continue beta blocker and anticoagulation Depression:  Significant on buspar and celexa CHF:  Diastolic continue beta blockers and diuretics repeat BMET Friday with BUN only 20 and Cr improved 1.6 She is not too dry Anticoagulation: no bleeding issues f/u coumadin clinic  Chol:  On statin   Cholesterol is at goal.  Continue current dose of statin and diet Rx.  No myalgias or side effects.  F/U  LFT's in 6 months. Lab Results  Component Value Date   LDLCALC 85 10/07/2014

## 2015-03-17 ENCOUNTER — Ambulatory Visit (INDEPENDENT_AMBULATORY_CARE_PROVIDER_SITE_OTHER): Payer: Medicare PPO | Admitting: Cardiovascular Disease

## 2015-03-17 ENCOUNTER — Encounter: Payer: Self-pay | Admitting: Cardiovascular Disease

## 2015-03-17 VITALS — BP 148/90 | HR 66 | Ht 64.0 in | Wt 161.4 lb

## 2015-03-17 DIAGNOSIS — Z954 Presence of other heart-valve replacement: Secondary | ICD-10-CM

## 2015-03-17 DIAGNOSIS — Z952 Presence of prosthetic heart valve: Secondary | ICD-10-CM

## 2015-03-17 NOTE — Patient Instructions (Addendum)
Medication Instructions:  Your physician recommends that you continue on your current medications as directed. Please refer to the Current Medication list given to you today.  Labwork: NONE  Testing/Procedures: NONE  Follow-Up: Your physician recommends that you schedule a follow-up appointment in: 6 months with Dr. Eden EmmsNishan

## 2015-03-20 ENCOUNTER — Other Ambulatory Visit: Payer: Self-pay | Admitting: Cardiovascular Disease

## 2015-03-20 LAB — HM DIABETES EYE EXAM

## 2015-03-26 ENCOUNTER — Encounter: Payer: Self-pay | Admitting: General Practice

## 2015-04-03 ENCOUNTER — Ambulatory Visit (INDEPENDENT_AMBULATORY_CARE_PROVIDER_SITE_OTHER): Payer: Medicare PPO

## 2015-04-03 DIAGNOSIS — Z5181 Encounter for therapeutic drug level monitoring: Secondary | ICD-10-CM

## 2015-04-03 DIAGNOSIS — Z9889 Other specified postprocedural states: Secondary | ICD-10-CM | POA: Diagnosis not present

## 2015-04-03 DIAGNOSIS — I4891 Unspecified atrial fibrillation: Secondary | ICD-10-CM | POA: Diagnosis not present

## 2015-04-03 DIAGNOSIS — I059 Rheumatic mitral valve disease, unspecified: Secondary | ICD-10-CM

## 2015-04-03 LAB — POCT INR: INR: 3.2

## 2015-04-11 ENCOUNTER — Ambulatory Visit (INDEPENDENT_AMBULATORY_CARE_PROVIDER_SITE_OTHER): Payer: Medicare PPO | Admitting: Family Medicine

## 2015-04-11 ENCOUNTER — Encounter: Payer: Self-pay | Admitting: Family Medicine

## 2015-04-11 ENCOUNTER — Other Ambulatory Visit: Payer: Self-pay | Admitting: General Practice

## 2015-04-11 VITALS — BP 134/84 | HR 50 | Temp 98.4°F | Resp 16 | Wt 158.2 lb

## 2015-04-11 DIAGNOSIS — M25552 Pain in left hip: Secondary | ICD-10-CM

## 2015-04-11 MED ORDER — TRIAMCINOLONE ACETONIDE 0.1 % EX CREA: 1.0000 "application " | TOPICAL_CREAM | Freq: Two times a day (BID) | CUTANEOUS | Status: DC

## 2015-04-11 MED ORDER — PREDNISONE 10 MG PO TABS: ORAL_TABLET | ORAL | Status: DC

## 2015-04-11 MED ORDER — OXYCODONE-ACETAMINOPHEN 5-325 MG PO TABS: 1.0000 | ORAL_TABLET | Freq: Three times a day (TID) | ORAL | Status: DC | PRN

## 2015-04-11 NOTE — Patient Instructions (Signed)
Follow up as needed Start the Prednisone as directed Take the Percocet as needed for severe pain Alternate heat/ice Call with any questions or concerns Hang in there!!!

## 2015-04-11 NOTE — Progress Notes (Signed)
   Subjective:    Patient ID: Kelsey Cochran, female    DOB: 05/15/1930, 81 y.o.   MRN: [id number]  HPI L hip pain- pt has hx of surgery w/ Dr Charlann Boxerlin.  Has appt w/ [facility] 1 week from today.  Pt reports no relief w/ vicodin.  Pt states hip pain has been progressively worsening x3 weeks.  Pt was changing the sheets on her bed and then the pain started.  Pt reports she has pain in L buttock and radiates down into thigh and calf.  Pt is in tears in office and she is not typically a complainer.   Review of Systems For ROS see HPI     Objective:   Physical Exam  Constitutional: She is oriented to person, place, and time. She appears well-developed and well-nourished. She appears distressed (tearful, obviously in pain).  HENT:  Head: Normocephalic and atraumatic.  Musculoskeletal: She exhibits tenderness (TTP over L hip surgical site, TTP over L buttock). She exhibits no edema.  Neurological: She is alert and oriented to person, place, and time. No cranial nerve deficit. Coordination normal.  (-) SLR bilaterally  Skin: Skin is warm and dry. No erythema.  Psychiatric: She has a normal mood and affect. Her behavior is normal. Thought content normal.  Vitals reviewed.         Assessment & Plan:

## 2015-04-14 NOTE — Assessment & Plan Note (Signed)
Deteriorated.  Pt is severe pain and having no relief w/ her usual Vicodin.  Will start prednisone in setting of coumadin (cannot take NSAIDs) and increase to Percocet for the weekend.  Called [facility] and pt now has appt on Tuesday rather than next Friday.  Reviewed supportive care and red flags that should prompt return.  Pt expressed understanding and is in agreement w/ plan.

## 2015-04-18 ENCOUNTER — Telehealth: Payer: Self-pay | Admitting: Cardiovascular Disease

## 2015-04-18 NOTE — Telephone Encounter (Signed)
New message        Pt needs a shot in the spine and wants to know if it is ok to stop Coumadin for 5 days

## 2015-04-18 NOTE — Telephone Encounter (Signed)
Returned call to pt, pt does not have injection scheduled yet.  Has been advised will need to hold Coumadin 5 days prior to injection.  Please advise if ok to hold Coumadin.

## 2015-04-18 NOTE — Telephone Encounter (Signed)
Follow up        Use this number on this note

## 2015-04-21 NOTE — Telephone Encounter (Addendum)
Pt has a history of MVR and TIA.  Will need Lovenox bridge while off Coumadin for injection.  Pt aware to let us know when the injection is scheduled.  Faxed clearance to [facility].

## 2015-05-01 ENCOUNTER — Ambulatory Visit (INDEPENDENT_AMBULATORY_CARE_PROVIDER_SITE_OTHER): Payer: Medicare PPO | Admitting: *Deleted

## 2015-05-01 DIAGNOSIS — Z5181 Encounter for therapeutic drug level monitoring: Secondary | ICD-10-CM

## 2015-05-01 DIAGNOSIS — I059 Rheumatic mitral valve disease, unspecified: Secondary | ICD-10-CM

## 2015-05-01 DIAGNOSIS — Z9889 Other specified postprocedural states: Secondary | ICD-10-CM | POA: Diagnosis not present

## 2015-05-01 DIAGNOSIS — I4891 Unspecified atrial fibrillation: Secondary | ICD-10-CM | POA: Diagnosis not present

## 2015-05-01 LAB — POCT INR: INR: 2.6

## 2015-05-01 MED ORDER — ENOXAPARIN SODIUM 60 MG/0.6ML ~~LOC~~ SOLN: 60.0000 mg | Freq: Two times a day (BID) | SUBCUTANEOUS | Status: DC

## 2015-05-01 NOTE — Patient Instructions (Addendum)
05/03/15 Take your last dose of Coumadin  05/04/15 Do not take any Coumadin or Lovenox  05/05/15 Start taking Lovenox 60mg  at 8a and 8p into the fatty abdominal tissue 2 inches away from the navel  05/06/15 Continue taking Lovenox 60mg  at 8a and 8p into the fatty abdominal tissue 2 inches away from the navel  05/07/15 Continue taking Lovenox 60mg  at 8a and 8p into the fatty abdominal tissue 2 inches away from the navel  05/08/15 Continue taking Lovenox 60mg  at 8a into the fatty abdominal tissue 2 inches away from the navel  05/09/15 Day of procedure, do not take any Lovenox or Coumadin  Resume Lovenox and and Coumadin when approved by Dr. Ethelene Hal. Restart coumadin at normal dose and continue Lovenox twice a day until appointment on Thursday

## 2015-05-15 ENCOUNTER — Ambulatory Visit (INDEPENDENT_AMBULATORY_CARE_PROVIDER_SITE_OTHER): Payer: Medicare PPO | Admitting: *Deleted

## 2015-05-15 DIAGNOSIS — Z9889 Other specified postprocedural states: Secondary | ICD-10-CM | POA: Diagnosis not present

## 2015-05-15 DIAGNOSIS — I059 Rheumatic mitral valve disease, unspecified: Secondary | ICD-10-CM

## 2015-05-15 DIAGNOSIS — Z5181 Encounter for therapeutic drug level monitoring: Secondary | ICD-10-CM

## 2015-05-15 DIAGNOSIS — I4891 Unspecified atrial fibrillation: Secondary | ICD-10-CM

## 2015-05-15 LAB — POCT INR: INR: 1.8

## 2015-05-21 ENCOUNTER — Ambulatory Visit (INDEPENDENT_AMBULATORY_CARE_PROVIDER_SITE_OTHER): Payer: Medicare PPO | Admitting: *Deleted

## 2015-05-21 DIAGNOSIS — Z5181 Encounter for therapeutic drug level monitoring: Secondary | ICD-10-CM

## 2015-05-21 DIAGNOSIS — I4891 Unspecified atrial fibrillation: Secondary | ICD-10-CM

## 2015-05-21 DIAGNOSIS — I059 Rheumatic mitral valve disease, unspecified: Secondary | ICD-10-CM | POA: Diagnosis not present

## 2015-05-21 DIAGNOSIS — Z9889 Other specified postprocedural states: Secondary | ICD-10-CM

## 2015-05-21 LAB — POCT INR: INR: 2.6

## 2015-05-28 ENCOUNTER — Other Ambulatory Visit: Payer: Self-pay | Admitting: Family Medicine

## 2015-05-28 NOTE — Telephone Encounter (Signed)
Med filled.  

## 2015-05-29 NOTE — Telephone Encounter (Signed)
Med filled.  

## 2015-06-03 ENCOUNTER — Ambulatory Visit (INDEPENDENT_AMBULATORY_CARE_PROVIDER_SITE_OTHER): Payer: Medicare PPO | Admitting: Surgery

## 2015-06-03 DIAGNOSIS — Z9889 Other specified postprocedural states: Secondary | ICD-10-CM | POA: Diagnosis not present

## 2015-06-03 DIAGNOSIS — I059 Rheumatic mitral valve disease, unspecified: Secondary | ICD-10-CM | POA: Diagnosis not present

## 2015-06-03 DIAGNOSIS — I4891 Unspecified atrial fibrillation: Secondary | ICD-10-CM

## 2015-06-03 DIAGNOSIS — Z5181 Encounter for therapeutic drug level monitoring: Secondary | ICD-10-CM | POA: Diagnosis not present

## 2015-06-03 LAB — POCT INR: INR: 2.6

## 2015-06-04 ENCOUNTER — Other Ambulatory Visit: Payer: Self-pay | Admitting: Cardiovascular Disease

## 2015-06-04 ENCOUNTER — Ambulatory Visit: Payer: Medicare PPO | Admitting: Family Medicine

## 2015-06-05 ENCOUNTER — Emergency Department (HOSPITAL_BASED_OUTPATIENT_CLINIC_OR_DEPARTMENT_OTHER): Payer: Medicare PPO

## 2015-06-05 ENCOUNTER — Encounter (HOSPITAL_BASED_OUTPATIENT_CLINIC_OR_DEPARTMENT_OTHER): Payer: Self-pay

## 2015-06-05 ENCOUNTER — Emergency Department (HOSPITAL_BASED_OUTPATIENT_CLINIC_OR_DEPARTMENT_OTHER)
Admission: EM | Admit: 2015-06-05 | Discharge: 2015-06-06 | Disposition: A | Discharge status: Home / Self Care | Payer: Medicare PPO | Attending: Emergency Medicine | Admitting: Emergency Medicine

## 2015-06-05 DIAGNOSIS — E780 Pure hypercholesterolemia: Secondary | ICD-10-CM | POA: Insufficient documentation

## 2015-06-05 DIAGNOSIS — Z7952 Long term (current) use of systemic steroids: Secondary | ICD-10-CM | POA: Diagnosis not present

## 2015-06-05 DIAGNOSIS — Y9389 Activity, other specified: Secondary | ICD-10-CM | POA: Diagnosis not present

## 2015-06-05 DIAGNOSIS — I4891 Unspecified atrial fibrillation: Secondary | ICD-10-CM | POA: Diagnosis not present

## 2015-06-05 DIAGNOSIS — Z8673 Personal history of transient ischemic attack (TIA), and cerebral infarction without residual deficits: Secondary | ICD-10-CM | POA: Insufficient documentation

## 2015-06-05 DIAGNOSIS — Z79899 Other long term (current) drug therapy: Secondary | ICD-10-CM | POA: Diagnosis not present

## 2015-06-05 DIAGNOSIS — Z954 Presence of other heart-valve replacement: Secondary | ICD-10-CM | POA: Diagnosis not present

## 2015-06-05 DIAGNOSIS — I5030 Unspecified diastolic (congestive) heart failure: Secondary | ICD-10-CM | POA: Diagnosis not present

## 2015-06-05 DIAGNOSIS — Y998 Other external cause status: Secondary | ICD-10-CM | POA: Insufficient documentation

## 2015-06-05 DIAGNOSIS — E119 Type 2 diabetes mellitus without complications: Secondary | ICD-10-CM | POA: Insufficient documentation

## 2015-06-05 DIAGNOSIS — M1990 Unspecified osteoarthritis, unspecified site: Secondary | ICD-10-CM | POA: Diagnosis not present

## 2015-06-05 DIAGNOSIS — Z7982 Long term (current) use of aspirin: Secondary | ICD-10-CM | POA: Insufficient documentation

## 2015-06-05 DIAGNOSIS — W01198A Fall on same level from slipping, tripping and stumbling with subsequent striking against other object, initial encounter: Secondary | ICD-10-CM | POA: Diagnosis not present

## 2015-06-05 DIAGNOSIS — S0003XA Contusion of scalp, initial encounter: Secondary | ICD-10-CM | POA: Diagnosis not present

## 2015-06-05 DIAGNOSIS — Y92009 Unspecified place in unspecified non-institutional (private) residence as the place of occurrence of the external cause: Secondary | ICD-10-CM | POA: Diagnosis not present

## 2015-06-05 DIAGNOSIS — Z7901 Long term (current) use of anticoagulants: Secondary | ICD-10-CM | POA: Insufficient documentation

## 2015-06-05 DIAGNOSIS — W19XXXA Unspecified fall, initial encounter: Secondary | ICD-10-CM

## 2015-06-05 DIAGNOSIS — D649 Anemia, unspecified: Secondary | ICD-10-CM | POA: Insufficient documentation

## 2015-06-05 DIAGNOSIS — N189 Chronic kidney disease, unspecified: Secondary | ICD-10-CM | POA: Insufficient documentation

## 2015-06-05 DIAGNOSIS — S51001A Unspecified open wound of right elbow, initial encounter: Secondary | ICD-10-CM | POA: Insufficient documentation

## 2015-06-05 DIAGNOSIS — S0990XA Unspecified injury of head, initial encounter: Secondary | ICD-10-CM

## 2015-06-05 DIAGNOSIS — I129 Hypertensive chronic kidney disease with stage 1 through stage 4 chronic kidney disease, or unspecified chronic kidney disease: Secondary | ICD-10-CM | POA: Insufficient documentation

## 2015-06-05 HISTORY — DX: Unspecified atrial fibrillation: I48.91

## 2015-06-05 MED ORDER — HYDROCODONE-ACETAMINOPHEN 5-325 MG PO TABS
1.0000 | ORAL_TABLET | Freq: Once | ORAL | Status: AC
  Administered 2015-06-05: 1 via ORAL
  Filled 2015-06-05: qty 1

## 2015-06-05 NOTE — ED Notes (Addendum)
Back from b/r via 'steady', remains alert, NAD, calm, interactive, family reports pt more vocal about HA, pt to radiology via stretcher.

## 2015-06-05 NOTE — ED Notes (Signed)
Patient presents to the ED after falling approx. 30-45 min ago at home after her wheelchair got stuck and she got out and tried to move it. The patient hit the back of her head on her floor at home. The patient is on blood thinner and denies any LOC. Patient has not had any pain medicine for the fall.

## 2015-06-05 NOTE — ED Provider Notes (Signed)
CSN: [id number]     Arrival date & time 06/05/15  2105 History  This chart was scribed for Kelsey Octave, MD by Kelsey Cochran, ED Scribe. This patient was seen in room MH11/MH11 and the patient's care was started at 9:35 PM.    Chief Complaint  Patient presents with  . Fall   The history is provided by the patient. No language interpreter was used.   HPI Comments: Kelsey Cochran is a 81 y.o. female with PMHx of HTN, CHF, A-fib and DM who presents to the Emergency Department complaining of a fall which occured about 1 hour ago. She states her battery operated scooter got stuck in the door when she got up and tried to pull it out. She slipped and fell backward, landing on her buttocks, then falling further and hitting her head and the back of her ellbow. She was unable to get up and slid down the hall to call for help. She reports associated pain in the back of the head as well as right shoulder and elbow pain. Pt lives alone. She is on coumadin for mitral valve replacement. Her last INR measurement was 2.6, which is within her allowed limit.  She does not recall her last tetanus shot. Her PCP is Dr Kelsey Cochran. She denies dizziness or lightheadedness before the fall. Pt denies LOC, visual disturbances, abdominal pain, CP, and back pain.  Past Medical History  Diagnosis Date  . S/P MVR (mitral valve replacement) 2005    [facility] 2005  [facility]  . Diastolic CHF, chronic   . Hypertension   . Pulmonary hypertension   . Arthritis   . TIA (transient ischemic attack)   . Diabetes mellitus   . Hypercholesterolemia   . TIA (transient ischemic attack)   . Anemia   . CRF (chronic renal failure)   . S/P tricuspid valve repair 2005    [facility]  . Atrial fibrillation    Past Surgical History  Procedure Laterality Date  . Knee arthroscopy      06/2007- Right knee Dr. Leslee Home  . Mitral valve replacement      TV annuloplasty- [id number]  . Shoulder surgery      rotato cuff 02/2006 [facility]   . Abdominal hysterectomy    . Hip fracture surgery      L hip  . Tonsillectomy    . Appendectomy    . Hernia repair     Family History  Problem Relation Age of Onset  . Asthma Daughter   . Allergies Daughter   . Allergies Daughter   . Cancer Sister     bladder   History  Substance Use Topics  . Smoking status: Never Smoker   . Smokeless tobacco: Never Used  . Alcohol Use: No   OB History    No data available     Review of Systems A complete 10 system review of systems was obtained and all systems are negative except as noted in the HPI and PMH.   Allergies  Meperidine hcl; Sulfamethoxazole-trimethoprim; and Heparin  Home Medications   Prior to Admission medications   Medication Sig Start Date End Date Taking? Authorizing Provider  alendronate (FOSAMAX) 35 MG tablet TAKE 1 TABLET EVERY 7 DAYS IN THE MORNING AS DIRECTED. SEE PACKAGE FOR ADDITIONAL INSTRUCTIONS 01/31/15   Sheliah Hatch, MD  aspirin 81 MG tablet Take 81 mg by mouth daily.      Historical Provider, MD  atorvastatin (LIPITOR) 20 MG tablet TAKE 1  TABLET EVERY DAY 01/10/15   Sheliah Hatch, MD  busPIRone (BUSPAR) 7.5 MG tablet TAKE 1 TABLET BY MOUTH TWICE DAILY 05/28/15   Sheliah Hatch, MD  carvedilol (COREG) 25 MG tablet TAKE 1/2 TABLET EVERY DAY 11/01/14   Wendall Stade, MD  Cholecalciferol (VITAMIN D3) 5000 UNITS CAPS Take 1 capsule by mouth daily.    Historical Provider, MD  Diphenhydramine-APAP, sleep, (TYLENOL PM EXTRA STRENGTH PO) Take by mouth. As needed     Historical Provider, MD  docusate sodium (COLACE) 100 MG capsule Take 100 mg by mouth 2 (two) times daily.      Historical Provider, MD  enoxaparin (LOVENOX) 60 MG/0.6ML injection Inject 0.6 mLs (60 mg total) into the skin every 12 (twelve) hours. Patient not taking: Reported on 06/03/2015 05/01/15   Wendall Stade, MD  fenofibrate 160 MG tablet TAKE 1 TABLET EVERY DAY 01/07/15   Sheliah Hatch, MD  folic acid (FOLVITE) 800 MCG tablet  Take 400 mcg by mouth daily.      Historical Provider, MD  furosemide (LASIX) 80 MG tablet TAKE 1 TABLET TWICE DAILY 10/22/14   Wendall Stade, MD  glipiZIDE (GLUCOTROL XL) 2.5 MG 24 hr tablet TAKE 1 TABLET BY MOUTH DAILY 05/29/15   Osborn Coho, MD  glucose blood (TRUETEST TEST) test strip . Dx E11.9 01/16/15   Sheliah Hatch, MD  HYDROcodone-acetaminophen (NORCO/VICODIN) 5-325 MG per tablet Take 1 tablet by mouth every 6 (six) hours as needed. 10/07/14   Sheliah Hatch, MD  isosorbide mononitrate (IMDUR) 30 MG 24 hr tablet TAKE 1 TABLET EVERY DAY 03/14/15   Wendall Stade, MD  methocarbamol (ROBAXIN) 500 MG tablet TAKE 1 TABLET BY MOUTH TWICE DAILY AS NEEDED 09/30/14   Sheliah Hatch, MD  oxyCODONE-acetaminophen (ROXICET) 5-325 MG per tablet Take 1 tablet by mouth every 8 (eight) hours as needed for severe pain. 04/11/15   Sheliah Hatch, MD  potassium chloride (KLOR-CON) 20 MEQ packet Take 20 mEq by mouth once. 10/08/14   Sheliah Hatch, MD  predniSONE (DELTASONE) 10 MG tablet 3 tabs x3 days and then 2 tabs x3 days and then 1 tab x3 days.  Take w/ food. 04/11/15   Sheliah Hatch, MD  sertraline (ZOLOFT) 25 MG tablet TAKE 1 TABLET BY MOUTH EVERY DAY 02/26/15   Sheliah Hatch, MD  spironolactone (ALDACTONE) 25 MG tablet TAKE 1 TABLET EVERY DAY 06/04/15   Wendall Stade, MD  triamcinolone cream (KENALOG) 0.1 % Apply 1 application topically 2 (two) times daily. 04/11/15   Sheliah Hatch, MD  Vitamin D, Ergocalciferol, (DRISDOL) 50000 UNITS CAPS capsule Take 1 capsule (50,000 Units total) by mouth every 7 (seven) days. 02/12/15   Sheliah Hatch, MD  warfarin (COUMADIN) 2.5 MG tablet TAKE AS DIRECTED 03/20/15   Wendall Stade, MD   BP 133/62 mmHg  Pulse 84  Temp(Src) 98 F (36.7 C) (Oral)  Resp 16  Ht  (1.6 m)  Wt 143 lb 8 oz (65.091 kg)  BMI 25.43 kg/m2  SpO2 98% Physical Exam  Constitutional: She is oriented to person, place, and time. She appears  well-developed and well-nourished. No distress.  HENT:  Head: Normocephalic and atraumatic.  Mouth/Throat: Oropharynx is clear and moist. No oropharyngeal exudate.  2cm Hematoma to  L occipital region.  Eyes: Conjunctivae and EOM are normal. Pupils are equal, round, and reactive to light.  Neck: Normal range of motion. Neck supple.  No meningismus.  Cardiovascular: Normal rate, regular rhythm, normal heart sounds and intact distal pulses.   No murmur heard. Pulmonary/Chest: Effort normal and breath sounds normal. No respiratory distress.  Abdominal: Soft. There is no tenderness. There is no rebound and no guarding.  Musculoskeletal: Normal range of motion. She exhibits no edema or tenderness.  No C-spine tenderness. FROM of hips without pain. No T or L spine tenderness  Neurological: She is alert and oriented to person, place, and time. No cranial nerve deficit. She exhibits normal muscle tone. Coordination normal.  No ataxia on finger to nose bilaterally. No pronator drift. 5/5 strength throughout. CN 2-12 intact. Negative Romberg. Equal grip strength. Sensation intact.   Skin: Skin is warm.  Skin tear to R elbow with no bony tenderness.  Psychiatric: She has a normal mood and affect. Her behavior is normal.  Nursing note and vitals reviewed.   ED Course  Procedures  DIAGNOSTIC STUDIES: Oxygen Saturation is 97% on RA, adequate by my interpretation.    COORDINATION OF CARE: 9:43 PM - Discussed plans to order diagnostic imaging, as well as pain medication. Pt advised of plan for treatment and pt agrees.  Labs Review Labs Reviewed - No data to display  Imaging Review Dg Chest 2 View  06/06/2015   CLINICAL DATA:  81 year old female with fall and chest pain  EXAM: CHEST  2 VIEW  COMPARISON:  Radiograph dated 11/12/2014  FINDINGS: Two views of the chest demonstrate emphysematous changes of the lungs. There is no focal consolidation, pleural effusion, or pneumothorax. Stable  cardiomegaly. Osteopenia with degenerative changes of the spine. No definite acute fracture. Coronary surgical clips noted. A surgical wire is seen over the right mid lung field similar to prior study.  IMPRESSION: No active cardiopulmonary disease.   Electronically Signed   By: Elgie Collard M.D.   On: 06/06/2015 00:30   Dg Pelvis 1-2 Views  06/06/2015   CLINICAL DATA:  81 year old female with fall and sacral pain.  EXAM: PELVIS - 1-2 VIEW  COMPARISON:  None.  FINDINGS: No acute fracture or dislocation. The bones are osteopenic. There is degenerative changes of the lower lumbar spine. Left femoral intra medullary rod and fixation screw noted. There are heterotopic bone formation adjacent to the greater and lesser trochanter of the left femur.  IMPRESSION: No acute fracture or dislocation.   Electronically Signed   By: Elgie Collard M.D.   On: 06/06/2015 00:35   Dg Shoulder Right  06/06/2015   CLINICAL DATA:  81 year old female with fall and right shoulder pain stop  EXAM: RIGHT SHOULDER - 2+ VIEW  COMPARISON:  Radiograph dated 06/05/2015  FINDINGS: Evaluation is limited due to suboptimal positioning and superimposition of the adjacent osseous structures. There is osteopenia with degenerative changes of the shoulder. There is high-riding of the right humeral head compatible with chronic rotator cuff injury. No definite acute fracture or dislocation identified.  IMPRESSION: No definite acute fracture or dislocation.   Electronically Signed   By: Elgie Collard M.D.   On: 06/06/2015 00:33   Dg Elbow Complete Right  06/06/2015   CLINICAL DATA:  81 year old female with fall and right arm pain  EXAM: RIGHT ELBOW - COMPLETE 3+ VIEW  COMPARISON:  None.  FINDINGS: There is no evidence of fracture, dislocation, or joint effusion. Soft tissues are unremarkable.  IMPRESSION: No acute fracture or dislocation.   Electronically Signed   By: Elgie Collard M.D.   On: 06/06/2015 00:34   Ct Head Wo  Contrast  06/05/2015   CLINICAL DATA:  81 year old female status post fall with head and neck pain.  EXAM: CT HEAD WITHOUT CONTRAST  CT CERVICAL SPINE WITHOUT CONTRAST  TECHNIQUE: Multidetector CT imaging of the head and cervical spine was performed following the standard protocol without intravenous contrast. Multiplanar CT image reconstructions of the cervical spine were also generated.  COMPARISON:  CT dated 02/02/2010  FINDINGS: CT HEAD FINDINGS  There is slight prominence of the ventricles and sulci compatible with age-related volume loss. Mild periventricular and deep white matter hypodensities represent chronic microvascular ischemic changes. There is no intracranial hemorrhage. No mass effect or midline shift identified.  The visualized paranasal sinuses and mastoid air cells are well aerated. The calvarium is intact. There is hyperostosis frontalis. Left occipital scalp bruising.  CT CERVICAL SPINE FINDINGS  There is no acute fracture or subluxation of the cervical spine.Multilevel degenerative changes with osteophytes predominantly involving C1-C2 and C5-C7. There are posterior osteophytes at C5-C6 and C6-C7 with mild narrowing of the central canal. There are diffuse facet hypertrophy. The odontoid and spinous processes are intact.There is normal anatomic alignment of the C1-C2 lateral masses. The visualized soft tissues appear unremarkable.  IMPRESSION: No acute intracranial pathology.  Mild age-related atrophy and chronic microvascular ischemic disease.  No acute/traumatic cervical spine injury.   Electronically Signed   By: Elgie Collard M.D.   On: 06/05/2015 23:30   Ct Cervical Spine Wo Contrast  06/05/2015   CLINICAL DATA:  81 year old female status post fall with head and neck pain.  EXAM: CT HEAD WITHOUT CONTRAST  CT CERVICAL SPINE WITHOUT CONTRAST  TECHNIQUE: Multidetector CT imaging of the head and cervical spine was performed following the standard protocol without intravenous contrast.  Multiplanar CT image reconstructions of the cervical spine were also generated.  COMPARISON:  CT dated 02/02/2010  FINDINGS: CT HEAD FINDINGS  There is slight prominence of the ventricles and sulci compatible with age-related volume loss. Mild periventricular and deep white matter hypodensities represent chronic microvascular ischemic changes. There is no intracranial hemorrhage. No mass effect or midline shift identified.  The visualized paranasal sinuses and mastoid air cells are well aerated. The calvarium is intact. There is hyperostosis frontalis. Left occipital scalp bruising.  CT CERVICAL SPINE FINDINGS  There is no acute fracture or subluxation of the cervical spine.Multilevel degenerative changes with osteophytes predominantly involving C1-C2 and C5-C7. There are posterior osteophytes at C5-C6 and C6-C7 with mild narrowing of the central canal. There are diffuse facet hypertrophy. The odontoid and spinous processes are intact.There is normal anatomic alignment of the C1-C2 lateral masses. The visualized soft tissues appear unremarkable.  IMPRESSION: No acute intracranial pathology.  Mild age-related atrophy and chronic microvascular ischemic disease.  No acute/traumatic cervical spine injury.   Electronically Signed   By: Elgie Collard M.D.   On: 06/05/2015 23:30     EKG Interpretation None      MDM   Final diagnoses:  Fall, initial encounter  Head injury, initial encounter  Fall backwards, striking head.  No LOC. On coumadin. Neurologically intact. Tetanus within 5-10 years per family.  CT head obtained given head injury on coumadin.  Abrasions and skin tear to R elbow.  Imaging negative.  D/w patient and family risk of delayed bleed on coumadin and need to return if worsening headache, vomiting, behavior change.  She is ambulatory with walker which is baseline.  Return precautions discussed.  I personally performed the services described in this documentation, which was scribed in  my presence.  The recorded information has been reviewed and is accurate.   Kelsey Octave, MD 06/06/15 [phone]

## 2015-06-05 NOTE — ED Notes (Signed)
Dr. Manus Gunning notified of hematoma and coumadin use.  No order for CT at this time. Will see this pt next.

## 2015-06-05 NOTE — ED Notes (Addendum)
Dr. Manus Gunning at [facility].  Family x2 at [facility].  Pt alert, NAD, calm, interactive, PERRL, MAEx4. Taking coumadin, not taking lovenox, last lovenox June 24.

## 2015-06-06 MED ORDER — HYDROCODONE-ACETAMINOPHEN 5-325 MG PO TABS
1.0000 | ORAL_TABLET | Freq: Once | ORAL | Status: AC
  Administered 2015-06-06: 1 via ORAL
  Filled 2015-06-06: qty 1

## 2015-06-06 NOTE — ED Notes (Signed)
Dr. Manus Gunning at [facility] updating pt/family about results and plan.

## 2015-06-06 NOTE — Discharge Instructions (Signed)
Fall Prevention and Home Safety Return to the ED if you develop worsening headache, vomiting, behavior change or any other concerns. Falls cause injuries and can affect all age groups. It is possible to use preventive measures to significantly decrease the likelihood of falls. There are many simple measures which can make your home safer and prevent falls. OUTDOORS  Repair cracks and edges of walkways and driveways.  Remove high doorway thresholds.  Trim shrubbery on the main path into your home.  Have good outside lighting.  Clear walkways of tools, rocks, debris, and clutter.  Check that handrails are not broken and are securely fastened. Both sides of steps should have handrails.  Have leaves, snow, and ice cleared regularly.  Use sand or salt on walkways during winter months.  In the garage, clean up grease or oil spills. BATHROOM  Install night lights.  Install grab bars by the toilet and in the tub and shower.  Use non-skid mats or decals in the tub or shower.  Place a plastic non-slip stool in the shower to sit on, if needed.  Keep floors dry and clean up all water on the floor immediately.  Remove soap buildup in the tub or shower on a regular basis.  Secure bath mats with non-slip, double-sided rug tape.  Remove throw rugs and tripping hazards from the floors. BEDROOMS  Install night lights.  Make sure a bedside light is easy to reach.  Do not use oversized bedding.  Keep a telephone by your bedside.  Have a firm chair with side arms to use for getting dressed.  Remove throw rugs and tripping hazards from the floor. KITCHEN  Keep handles on pots and pans turned toward the center of the stove. Use back burners when possible.  Clean up spills quickly and allow time for drying.  Avoid walking on wet floors.  Avoid hot utensils and knives.  Position shelves so they are not too high or low.  Place commonly used objects within easy reach.  If  necessary, use a sturdy step stool with a grab bar when reaching.  Keep electrical cables out of the way.  Do not use floor polish or wax that makes floors slippery. If you must use wax, use non-skid floor wax.  Remove throw rugs and tripping hazards from the floor. STAIRWAYS  Never leave objects on stairs.  Place handrails on both sides of stairways and use them. Fix any loose handrails. Make sure handrails on both sides of the stairways are as long as the stairs.  Check carpeting to make sure it is firmly attached along stairs. Make repairs to worn or loose carpet promptly.  Avoid placing throw rugs at the top or bottom of stairways, or properly secure the rug with carpet tape to prevent slippage. Get rid of throw rugs, if possible.  Have an electrician put in a light switch at the top and bottom of the stairs. OTHER FALL PREVENTION TIPS  Wear low-heel or rubber-soled shoes that are supportive and fit well. Wear closed toe shoes.  When using a stepladder, make sure it is fully opened and both spreaders are firmly locked. Do not climb a closed stepladder.  Add color or contrast paint or tape to grab bars and handrails in your home. Place contrasting color strips on first and last steps.  Learn and use mobility aids as needed. Install an electrical emergency response system.  Turn on lights to avoid dark areas. Replace light bulbs that burn out immediately. Get light  switches that glow.  Arrange furniture to create clear pathways. Keep furniture in the same place.  Firmly attach carpet with non-skid or double-sided tape.  Eliminate uneven floor surfaces.  Select a carpet pattern that does not visually hide the edge of steps.  Be aware of all pets. OTHER HOME SAFETY TIPS  Set the water temperature for 120 F (48.8 C).  Keep emergency numbers on or near the telephone.  Keep smoke detectors on every level of the home and near sleeping areas. Document Released: 10/22/2002  Document Revised: 05/02/2012 Document Reviewed: 01/21/2012 [facility] Patient Information 2015 [place], [place]. This information is not intended to replace advice given to you by your health care provider. Make sure you discuss any questions you have with your health care provider.

## 2015-06-26 ENCOUNTER — Other Ambulatory Visit: Payer: Self-pay | Admitting: Cardiovascular Disease

## 2015-06-29 ENCOUNTER — Other Ambulatory Visit: Payer: Self-pay | Admitting: Family Medicine

## 2015-06-30 NOTE — Telephone Encounter (Signed)
Medication filled to pharmacy as requested.   

## 2015-07-01 ENCOUNTER — Encounter: Payer: Self-pay | Admitting: Family Medicine

## 2015-07-01 ENCOUNTER — Ambulatory Visit (INDEPENDENT_AMBULATORY_CARE_PROVIDER_SITE_OTHER): Payer: Medicare PPO | Admitting: Family Medicine

## 2015-07-01 VITALS — BP 134/64 | HR 73 | Temp 97.9°F | Resp 16 | Wt 148.1 lb

## 2015-07-01 DIAGNOSIS — I10 Essential (primary) hypertension: Secondary | ICD-10-CM | POA: Diagnosis not present

## 2015-07-01 DIAGNOSIS — E1159 Type 2 diabetes mellitus with other circulatory complications: Secondary | ICD-10-CM

## 2015-07-01 DIAGNOSIS — E7800 Pure hypercholesterolemia, unspecified: Secondary | ICD-10-CM

## 2015-07-01 DIAGNOSIS — F32A Depression, unspecified: Secondary | ICD-10-CM

## 2015-07-01 DIAGNOSIS — M25552 Pain in left hip: Secondary | ICD-10-CM

## 2015-07-01 DIAGNOSIS — E1151 Type 2 diabetes mellitus with diabetic peripheral angiopathy without gangrene: Secondary | ICD-10-CM

## 2015-07-01 DIAGNOSIS — F329 Major depressive disorder, single episode, unspecified: Secondary | ICD-10-CM

## 2015-07-01 LAB — CBC WITH DIFFERENTIAL/PLATELET
Basophils Absolute: 0 10*3/uL (ref 0.0–0.1)
Basophils Relative: 0.3 % (ref 0.0–3.0)
EOS ABS: 0.5 10*3/uL (ref 0.0–0.7)
Eosinophils Relative: 5.9 % — ABNORMAL HIGH (ref 0.0–5.0)
HCT: 36.6 % (ref 36.0–46.0)
HEMOGLOBIN: 12.1 g/dL (ref 12.0–15.0)
LYMPHS ABS: 0.9 10*3/uL (ref 0.7–4.0)
Lymphocytes Relative: 10.6 % — ABNORMAL LOW (ref 12.0–46.0)
MCHC: 33 g/dL (ref 30.0–36.0)
MCV: 98.6 fl (ref 78.0–100.0)
MONO ABS: 0.4 10*3/uL (ref 0.1–1.0)
Monocytes Relative: 4.6 % (ref 3.0–12.0)
NEUTROS PCT: 78.6 % — AB (ref 43.0–77.0)
Neutro Abs: 6.8 10*3/uL (ref 1.4–7.7)
Platelets: 189 10*3/uL (ref 150.0–400.0)
RBC: 3.71 Mil/uL — AB (ref 3.87–5.11)
RDW: 15.3 % (ref 11.5–15.5)
WBC: 8.6 10*3/uL (ref 4.0–10.5)

## 2015-07-01 LAB — HEMOGLOBIN A1C: Hgb A1c MFr Bld: 6 % (ref 4.6–6.5)

## 2015-07-01 LAB — LIPID PANEL
Cholesterol: 154 mg/dL (ref 0–200)
HDL: 39.4 mg/dL (ref >39.00)
LDL CALC: 90 mg/dL (ref 0–99)
NonHDL: 114.55
TRIGLYCERIDES: 121 mg/dL (ref 0.0–149.0)
Total CHOL/HDL Ratio: 4
VLDL: 24.2 mg/dL (ref 0.0–40.0)

## 2015-07-01 LAB — MICROALBUMIN / CREATININE URINE RATIO
Creatinine,U: 44 mg/dL
MICROALB/CREAT RATIO: 1.6 mg/g (ref 0.0–30.0)

## 2015-07-01 LAB — HEPATIC FUNCTION PANEL
ALBUMIN: 4.5 g/dL (ref 3.5–5.2)
ALT: 11 U/L (ref 0–35)
AST: 25 U/L (ref 0–37)
Alkaline Phosphatase: 40 U/L (ref 39–117)
Bilirubin, Direct: 0.4 mg/dL — ABNORMAL HIGH (ref 0.0–0.3)
Total Bilirubin: 1.1 mg/dL (ref 0.2–1.2)
Total Protein: 7.8 g/dL (ref 6.0–8.3)

## 2015-07-01 LAB — BASIC METABOLIC PANEL
BUN: 50 mg/dL — AB (ref 6–23)
CO2: 30 mEq/L (ref 19–32)
CREATININE: 1.95 mg/dL — AB (ref 0.40–1.20)
Calcium: 9.9 mg/dL (ref 8.4–10.5)
Chloride: 101 mEq/L (ref 96–112)
GFR: 26.05 mL/min — AB (ref >60.00)
GLUCOSE: 101 mg/dL — AB (ref 70–99)
Potassium: 4 mEq/L (ref 3.5–5.1)
SODIUM: 143 meq/L (ref 135–145)

## 2015-07-01 MED ORDER — SERTRALINE HCL 50 MG PO TABS: 50.0000 mg | ORAL_TABLET | Freq: Every day | ORAL | Status: DC

## 2015-07-01 MED ORDER — OXYCODONE-ACETAMINOPHEN 5-325 MG PO TABS: 1.0000 | ORAL_TABLET | Freq: Three times a day (TID) | ORAL | Status: DC | PRN

## 2015-07-01 MED ORDER — METHOCARBAMOL 500 MG PO TABS: 500.0000 mg | ORAL_TABLET | Freq: Two times a day (BID) | ORAL | Status: DC | PRN

## 2015-07-01 NOTE — Progress Notes (Signed)
Pre visit review using our clinic review tool, if applicable. No additional management support is needed unless otherwise documented below in the visit note. 

## 2015-07-01 NOTE — Progress Notes (Signed)
   Subjective:    Patient ID: Kelsey Cochran, female    DOB: March 04, 1930, 81 y.o.   MRN: [id number]  HPI DM- chronic problem, on Glipizide.  UTD on eye exam, foot exam, Microalbumin.  Pt reports home CBGs are 'very good'.  Denies symptomatic lows.  Denies numbness/tingling of hands/feet.  No abd pain, N/V.  Hyperlipidemia- chronic problem, on Lipitor, fenofibrate.  HTN- chronic problem, on Coreg, Lasix, Spironolactone.  No CP, SOB above baseline, HAs, visual changes, edema.  Depression- ongoing problem for pt.  On Zoloft.  Pt interested in increasing dose.  Buspar is working- 'i don't seem anxious about anything'.  Hypokalemia- pt has not been taking K+ due to size of pills.  Script was switched to packets as of November but pt never received this.  Pain- chronic in hip.  Pt needs refills on pain meds and Robaxin.  Review of Systems For ROS see HPI     Objective:   Physical Exam  Constitutional: She is oriented to person, place, and time. She appears well-developed and well-nourished. No distress.  HENT:  Head: Normocephalic and atraumatic.  Eyes: Conjunctivae and EOM are normal. Pupils are equal, round, and reactive to light.  Neck: Normal range of motion. Neck supple. No thyromegaly present.  Cardiovascular: Normal rate, regular rhythm and intact distal pulses.   No murmur heard. Mitral click  Pulmonary/Chest: Effort normal and breath sounds normal. No respiratory distress.  Abdominal: Soft. She exhibits no distension. There is no tenderness.  Musculoskeletal: She exhibits no edema.  Lymphadenopathy:    She has no cervical adenopathy.  Neurological: She is alert and oriented to person, place, and time.  Skin: Skin is warm and dry.  Psychiatric: She has a normal mood and affect. Her behavior is normal.  Vitals reviewed.         Assessment & Plan:

## 2015-07-01 NOTE — Assessment & Plan Note (Signed)
Ongoing issue for pt.  UTD on eye exam.  Foot exam and microalbumin today as pt is intolerant to ACE/ARB.  Asymptomatic.  Check labs.  Adjust meds prn

## 2015-07-01 NOTE — Assessment & Plan Note (Signed)
Chronic problem.  Adequate control.  Asymptomatic.  Check labs.  No anticipated med changes 

## 2015-07-01 NOTE — Assessment & Plan Note (Signed)
Chronic problem.  Refill provided on Oxycodone and Robaxin.

## 2015-07-01 NOTE — Assessment & Plan Note (Signed)
Chronic problem.  Tolerating statin w/o difficulty.  Check labs.  Adjust meds prn  

## 2015-07-01 NOTE — Patient Instructions (Signed)
Schedule your complete physical in 3-4 months We'll notify you of your lab results and make any changes if needed Keep up the good work!  You look great! Increase the Zoloft to  -2 of what you have at home and 1 of the new prescription Call with any questions or concerns HAPPY LABOR DAY!!!

## 2015-07-01 NOTE — Assessment & Plan Note (Signed)
Improved since starting SSRI.  Pt interested in titrating dose.  Will increase to  daily

## 2015-07-02 ENCOUNTER — Encounter: Payer: Self-pay | Admitting: General Practice

## 2015-07-03 ENCOUNTER — Ambulatory Visit (INDEPENDENT_AMBULATORY_CARE_PROVIDER_SITE_OTHER): Payer: Medicare PPO | Admitting: *Deleted

## 2015-07-03 DIAGNOSIS — I059 Rheumatic mitral valve disease, unspecified: Secondary | ICD-10-CM | POA: Diagnosis not present

## 2015-07-03 DIAGNOSIS — I4891 Unspecified atrial fibrillation: Secondary | ICD-10-CM | POA: Diagnosis not present

## 2015-07-03 DIAGNOSIS — Z5181 Encounter for therapeutic drug level monitoring: Secondary | ICD-10-CM

## 2015-07-03 DIAGNOSIS — Z9889 Other specified postprocedural states: Secondary | ICD-10-CM | POA: Diagnosis not present

## 2015-07-03 LAB — POCT INR: INR: 3.9

## 2015-07-09 ENCOUNTER — Other Ambulatory Visit (INDEPENDENT_AMBULATORY_CARE_PROVIDER_SITE_OTHER): Payer: Medicare PPO

## 2015-07-09 ENCOUNTER — Other Ambulatory Visit: Payer: Self-pay | Admitting: Family Medicine

## 2015-07-09 DIAGNOSIS — R748 Abnormal levels of other serum enzymes: Secondary | ICD-10-CM

## 2015-07-09 DIAGNOSIS — R7989 Other specified abnormal findings of blood chemistry: Secondary | ICD-10-CM

## 2015-07-09 LAB — BASIC METABOLIC PANEL
BUN: 36 mg/dL — ABNORMAL HIGH (ref 6–23)
CO2: 27 meq/L (ref 19–32)
CREATININE: 1.94 mg/dL — AB (ref 0.40–1.20)
Calcium: 9.4 mg/dL (ref 8.4–10.5)
Chloride: 99 mEq/L (ref 96–112)
GFR: 26.21 mL/min — ABNORMAL LOW (ref >60.00)
Glucose, Bld: 151 mg/dL — ABNORMAL HIGH (ref 70–99)
Potassium: 3.8 mEq/L (ref 3.5–5.1)
SODIUM: 139 meq/L (ref 135–145)

## 2015-07-17 ENCOUNTER — Ambulatory Visit (INDEPENDENT_AMBULATORY_CARE_PROVIDER_SITE_OTHER): Payer: Medicare PPO | Admitting: Pharmacist

## 2015-07-17 DIAGNOSIS — Z5181 Encounter for therapeutic drug level monitoring: Secondary | ICD-10-CM | POA: Diagnosis not present

## 2015-07-17 DIAGNOSIS — I4891 Unspecified atrial fibrillation: Secondary | ICD-10-CM | POA: Diagnosis not present

## 2015-07-17 DIAGNOSIS — Z9889 Other specified postprocedural states: Secondary | ICD-10-CM

## 2015-07-17 DIAGNOSIS — I059 Rheumatic mitral valve disease, unspecified: Secondary | ICD-10-CM

## 2015-07-17 LAB — POCT INR: INR: 2.7

## 2015-07-23 ENCOUNTER — Other Ambulatory Visit: Payer: Self-pay | Admitting: Cardiovascular Disease

## 2015-07-28 ENCOUNTER — Other Ambulatory Visit: Payer: Self-pay | Admitting: Family Medicine

## 2015-07-28 ENCOUNTER — Other Ambulatory Visit: Payer: Self-pay | Admitting: Cardiovascular Disease

## 2015-07-28 ENCOUNTER — Telehealth: Payer: Self-pay | Admitting: Family Medicine

## 2015-07-28 DIAGNOSIS — R7989 Other specified abnormal findings of blood chemistry: Secondary | ICD-10-CM

## 2015-07-28 NOTE — Telephone Encounter (Signed)
Medication filled to pharmacy as requested.   

## 2015-07-28 NOTE — Telephone Encounter (Signed)
Called and spoke with pt, BMP was ordered. Pt stated that she told Nephrology that she was going to hold off on appt until we received this repeat lab work back. Pt was adamant that all she needed to do was get more water in her system, since she couldnot drink a lot with the last test due to the boil water advisory.

## 2015-07-28 NOTE — Telephone Encounter (Signed)
Relation to pt: self  Call back number: [phone] Pharmacy:  Reason for call:  Patient requesting lab orders to check her kidney

## 2015-07-28 NOTE — Telephone Encounter (Signed)
Please advise, pt creatnine level was elevated and we placed nephrology referral. Does she need a repeat lab?

## 2015-07-28 NOTE — Telephone Encounter (Signed)
We can repeat in 2 weeks at a lab only visit while we are waiting on Nephrology

## 2015-07-29 ENCOUNTER — Other Ambulatory Visit: Payer: Self-pay | Admitting: Family Medicine

## 2015-07-29 NOTE — Telephone Encounter (Signed)
Medication filled to pharmacy as requested.   

## 2015-08-07 ENCOUNTER — Ambulatory Visit (INDEPENDENT_AMBULATORY_CARE_PROVIDER_SITE_OTHER): Payer: Medicare PPO | Admitting: *Deleted

## 2015-08-07 DIAGNOSIS — Z5181 Encounter for therapeutic drug level monitoring: Secondary | ICD-10-CM

## 2015-08-07 DIAGNOSIS — I4891 Unspecified atrial fibrillation: Secondary | ICD-10-CM | POA: Diagnosis not present

## 2015-08-07 DIAGNOSIS — Z9889 Other specified postprocedural states: Secondary | ICD-10-CM

## 2015-08-07 DIAGNOSIS — I059 Rheumatic mitral valve disease, unspecified: Secondary | ICD-10-CM | POA: Diagnosis not present

## 2015-08-07 LAB — POCT INR: INR: 2.6

## 2015-08-11 ENCOUNTER — Other Ambulatory Visit (INDEPENDENT_AMBULATORY_CARE_PROVIDER_SITE_OTHER): Payer: Medicare PPO

## 2015-08-11 DIAGNOSIS — R7989 Other specified abnormal findings of blood chemistry: Secondary | ICD-10-CM

## 2015-08-11 DIAGNOSIS — R748 Abnormal levels of other serum enzymes: Secondary | ICD-10-CM | POA: Diagnosis not present

## 2015-08-11 LAB — BASIC METABOLIC PANEL
BUN: 32 mg/dL — ABNORMAL HIGH (ref 6–23)
CHLORIDE: 101 meq/L (ref 96–112)
CO2: 29 meq/L (ref 19–32)
CREATININE: 1.59 mg/dL — AB (ref 0.40–1.20)
Calcium: 9.4 mg/dL (ref 8.4–10.5)
GFR: 32.96 mL/min — ABNORMAL LOW (ref >60.00)
Glucose, Bld: 99 mg/dL (ref 70–99)
POTASSIUM: 3.6 meq/L (ref 3.5–5.1)
Sodium: 141 mEq/L (ref 135–145)

## 2015-08-19 LAB — HM MAMMOGRAPHY

## 2015-08-20 ENCOUNTER — Other Ambulatory Visit: Payer: Self-pay | Admitting: Cardiovascular Disease

## 2015-08-27 ENCOUNTER — Encounter: Payer: Self-pay | Admitting: General Practice

## 2015-08-29 ENCOUNTER — Other Ambulatory Visit: Payer: Self-pay | Admitting: Cardiovascular Disease

## 2015-08-31 ENCOUNTER — Other Ambulatory Visit: Payer: Self-pay | Admitting: Family Medicine

## 2015-09-01 NOTE — Telephone Encounter (Signed)
Medication filled to pharmacy as requested.   

## 2015-09-04 ENCOUNTER — Ambulatory Visit (INDEPENDENT_AMBULATORY_CARE_PROVIDER_SITE_OTHER): Payer: Medicare PPO | Admitting: *Deleted

## 2015-09-04 DIAGNOSIS — I4891 Unspecified atrial fibrillation: Secondary | ICD-10-CM | POA: Diagnosis not present

## 2015-09-04 DIAGNOSIS — Z5181 Encounter for therapeutic drug level monitoring: Secondary | ICD-10-CM | POA: Diagnosis not present

## 2015-09-04 DIAGNOSIS — Z9889 Other specified postprocedural states: Secondary | ICD-10-CM | POA: Diagnosis not present

## 2015-09-04 DIAGNOSIS — I059 Rheumatic mitral valve disease, unspecified: Secondary | ICD-10-CM

## 2015-09-04 LAB — POCT INR: INR: 2.4

## 2015-09-15 ENCOUNTER — Encounter: Payer: Self-pay | Admitting: *Deleted

## 2015-09-15 NOTE — Progress Notes (Signed)
Patient ID: Kelsey Cochran, female   DOB: 02/08/1930, 81 y.o.   MRN: [id number] [name] is seen today for F/U of dyspnea, anticoagulaiton and MVR and carotid disease. Reviewed duplex from 9/13 and 60-79% RICA stenosis stable. . She has significant anemia with Hct 24 in past most recent 4/15 32 improved . I reviewed her echo from 3/8 and EF normal with normal functioning MVR no evidence of perivalvular leak or other abnormality that could contribute to anemia via shearing effect/hemolysis. She apparantly has had Aranasp shots and F/U with Dr Myna HidalgoEnnever but I don't have these notes. Tried on lisinopril for proteinuria but could not tolerate for light headedness. Encouraged her to F/U with Dr Beverely Lowabori to find low dose substitute Recently put on fenofibrate for tryglycerides and INR elevated Following with coumadin clinic to adjust dose and better.  Seen by Doctor Delford FieldWright and with PA pressure in mid 50's presumed secondary to MV disease offerred cath and ? Rx for pulmonary hypertension but she declined.   Echo 9/15 with normal appearing MVR but severe pulmonary hypertension  Study Conclusions  - Left ventricle: The cavity size was normal. Systolic function was  normal. The estimated ejection fraction was in the range of 55% to 60%. Wall motion was normal; there were no regional wall motion abnormalities. - Aortic valve: Trileaflet; mildly thickened leaflets. - Mitral valve: A mechanical prosthesis was present and functioning normally. Valve area by continuity equation (using LVOT flow): 1.45 cm^2. - Left atrium: The atrium was mildly dilated. - Tricuspid valve: There was mild-moderate regurgitation. - Pulmonic valve: There was trivial regurgitation. - Pulmonary arteries: PA peak pressure: 85 mm Hg (S).  Impressions:  - The right ventricular systolic pressure was increased consistent with severe pulmonary hypertension.  02/18/15    RICA 60% stenosis  LICA 40-59% 08/20/14  Myovue was normal    She was started on Buspar with some lightening of her mood.  Discussed pro's/cons of right heart cath and she prefers not to have it again She is on beta blocker to maximize diastolic filling period, diuretic to lower filling pressures  And imdur as vasodilator as she cannot afford Any of the specific pulmonary vasodilators  With MVR and previous TIA she needs lovenox bridging   Dr Beverely Lowabori has told her to increase fluid intake   Lab Results  Component Value Date   CREATININE 1.59* 08/11/2015     ROS: Denies fever, malais, weight loss, blurry vision, decreased visual acuity, cough, sputum, SOB, hemoptysis, pleuritic pain, palpitaitons, heartburn, abdominal pain, melena, lower extremity edema, claudication, or rash.  All other systems reviewed and negative  General: Still some depressed  Healthy:  appears stated age HEENT: normal Neck supple with no adenopathy JVP normal no bruits no thyromegaly Lungs clear with no wheezing and good diaphragmatic motion Heart:  S1 click /S2 no murmur, no rub, gallop or click PMI normal Abdomen: benighn, BS positve, no tenderness, no AAA no bruit.  No HSM or HJR Distal pulses intact with no bruits No edema Neuro non-focal Skin warm and dry No muscular weakness   Current Outpatient Prescriptions  Medication Sig Dispense Refill  . alendronate (FOSAMAX) 35 MG tablet TAKE 1 TABLET EVERY 7 DAYS IN THE MORNING AS DIRECTED. SEE PACKAGE FOR ADDITIONAL INSTRUCTIONS 12 tablet 3  . aspirin 81 MG tablet Take 81 mg by mouth daily.      Marland Kitchen. atorvastatin (LIPITOR) 20 MG tablet Take 20 mg by mouth daily.    . busPIRone (BUSPAR) 7.5 MG tablet  TAKE 1 TABLET BY MOUTH TWICE DAILY 60 tablet 3  . carvedilol (COREG) 25 MG tablet Take 0.5 tablets (12.5 mg total) by mouth daily. 45 tablet 0  . Cholecalciferol (VITAMIN D3) 5000 UNITS CAPS Take 1 capsule by mouth daily.    . Diphenhydramine-APAP, sleep, (TYLENOL PM EXTRA STRENGTH PO) Take 1 tablet by mouth daily as  needed (for sleep). As needed    . docusate sodium (COLACE) 100 MG capsule Take 100 mg by mouth 2 (two) times daily.      . fenofibrate 160 MG tablet Take 160 mg by mouth daily.    . folic acid (FOLVITE) 800 MCG tablet Take 400 mcg by mouth daily.      . furosemide (LASIX) 80 MG tablet Take 80 mg by mouth 2 (two) times daily.    Marland Kitchen glipiZIDE (GLUCOTROL XL) 2.5 MG 24 hr tablet TAKE 1 TABLET BY MOUTH DAILY 30 tablet 6  . glucose blood (TRUETEST TEST) test strip . Dx E11.9 100 each 6  . HYDROcodone-acetaminophen (NORCO/VICODIN) 5-325 MG tablet Take 1 tablet by mouth every 6 (six) hours as needed for moderate pain.    . isosorbide mononitrate (IMDUR) 30 MG 24 hr tablet Take 30 mg by mouth daily.    . methocarbamol (ROBAXIN) 500 MG tablet Take 500 mg by mouth 2 (two) times daily as needed for muscle spasms.    Marland Kitchen oxyCODONE-acetaminophen (ROXICET) 5-325 MG per tablet Take 1 tablet by mouth every 8 (eight) hours as needed for severe pain. 45 tablet 0  . sertraline (ZOLOFT) 50 MG tablet Take 50 mg by mouth daily.    Marland Kitchen spironolactone (ALDACTONE) 25 MG tablet Take 25 mg by mouth daily.    Marland Kitchen triamcinolone cream (KENALOG) 0.1 % Apply 1 application topically 2 (two) times daily. 30 g 0  . warfarin (COUMADIN) 2.5 MG tablet TAKE AS DIRECTED 35 tablet 3  . [DISCONTINUED] glyBURIDE (DIABETA) 5 MG tablet Take 1 tablet (5 mg total) by mouth 2 (two) times daily. 180 tablet 0   No current facility-administered medications for this visit.    Allergies  Meperidine hcl; Sulfamethoxazole-trimethoprim; and Heparin  Electrocardiogram:  afib rate 76  Poor R wave progression RAD  09/17/15  afib rate 65 RVH no change   Assessment and Plan MVR:  Normal exam and echo continue beta blocker and anticoagulation Depression:  Significant on buspar and celexa CHF:  Diastolic continue beta blockers and diuretics CR has been elevated but would continue high dose bid lasix As she will have more symptomatic edema and dyspnea  without it  Anticoagulation: no bleeding issues f/u coumadin clinic  lovenox bridging recommended  Chol:  On statin   Cholesterol is at goal.  Continue current dose of statin and diet Rx.  No myalgias or side effects.  F/U  LFT's in 6 months. Lab Results  Component Value Date   LDLCALC 90 07/01/2015   CRF:  Encouraged her to f/u with renal to help adjust diuretic  She is clear she does not want dialysis         F/U with me in 6 months   Charlton Haws

## 2015-09-17 ENCOUNTER — Encounter: Payer: Self-pay | Admitting: Cardiovascular Disease

## 2015-09-17 ENCOUNTER — Ambulatory Visit (INDEPENDENT_AMBULATORY_CARE_PROVIDER_SITE_OTHER): Payer: Medicare PPO | Admitting: Cardiovascular Disease

## 2015-09-17 VITALS — BP 104/66 | HR 65 | Ht 63.0 in | Wt 167.8 lb

## 2015-09-17 DIAGNOSIS — I10 Essential (primary) hypertension: Secondary | ICD-10-CM | POA: Diagnosis not present

## 2015-09-17 DIAGNOSIS — I059 Rheumatic mitral valve disease, unspecified: Secondary | ICD-10-CM

## 2015-09-17 NOTE — Patient Instructions (Signed)

## 2015-09-24 ENCOUNTER — Other Ambulatory Visit: Payer: Self-pay | Admitting: Cardiovascular Disease

## 2015-09-29 ENCOUNTER — Other Ambulatory Visit: Payer: Self-pay | Admitting: Family Medicine

## 2015-09-29 NOTE — Telephone Encounter (Signed)
Medication filled to pharmacy as requested.   

## 2015-09-30 ENCOUNTER — Telehealth: Payer: Self-pay | Admitting: Family Medicine

## 2015-09-30 NOTE — Telephone Encounter (Signed)
Caller name: Self  Can be reached: [phone]  Pharmacy:  [id number] - [place], [place] - [address] [facility] [address] [phone] (Phone) [phone] (Fax)         Reason for call: Patient states that she has prescription for test strips but the Glucometer has been discontinued and she needs a prescription for a new one. She would like to have [facility] Glucometer if possible.

## 2015-10-01 MED ORDER — BLOOD GLUCOSE MONITOR KIT: PACK | Status: DC

## 2015-10-01 NOTE — Telephone Encounter (Signed)
rx sent to [facility] as requested, however I do not think they have [facility].

## 2015-10-02 ENCOUNTER — Ambulatory Visit (INDEPENDENT_AMBULATORY_CARE_PROVIDER_SITE_OTHER): Payer: Medicare PPO | Admitting: Surgery

## 2015-10-02 DIAGNOSIS — Z5181 Encounter for therapeutic drug level monitoring: Secondary | ICD-10-CM | POA: Diagnosis not present

## 2015-10-02 DIAGNOSIS — Z9889 Other specified postprocedural states: Secondary | ICD-10-CM

## 2015-10-02 DIAGNOSIS — I4891 Unspecified atrial fibrillation: Secondary | ICD-10-CM

## 2015-10-02 DIAGNOSIS — I059 Rheumatic mitral valve disease, unspecified: Secondary | ICD-10-CM

## 2015-10-02 LAB — POCT INR: INR: 6.2

## 2015-10-02 LAB — PROTIME-INR
INR: 5.06 (ref <1.50)
Prothrombin Time: 47.7 seconds — ABNORMAL HIGH (ref 11.6–15.2)

## 2015-10-08 ENCOUNTER — Ambulatory Visit (INDEPENDENT_AMBULATORY_CARE_PROVIDER_SITE_OTHER): Payer: Medicare PPO | Admitting: Pharmacist

## 2015-10-08 DIAGNOSIS — Z9889 Other specified postprocedural states: Secondary | ICD-10-CM

## 2015-10-08 DIAGNOSIS — I059 Rheumatic mitral valve disease, unspecified: Secondary | ICD-10-CM

## 2015-10-08 DIAGNOSIS — Z5181 Encounter for therapeutic drug level monitoring: Secondary | ICD-10-CM | POA: Diagnosis not present

## 2015-10-08 DIAGNOSIS — I4891 Unspecified atrial fibrillation: Secondary | ICD-10-CM

## 2015-10-08 LAB — POCT INR: INR: 3.7

## 2015-10-17 ENCOUNTER — Ambulatory Visit (INDEPENDENT_AMBULATORY_CARE_PROVIDER_SITE_OTHER): Payer: Medicare PPO | Admitting: *Deleted

## 2015-10-17 DIAGNOSIS — Z5181 Encounter for therapeutic drug level monitoring: Secondary | ICD-10-CM | POA: Diagnosis not present

## 2015-10-17 DIAGNOSIS — I059 Rheumatic mitral valve disease, unspecified: Secondary | ICD-10-CM

## 2015-10-17 DIAGNOSIS — Z9889 Other specified postprocedural states: Secondary | ICD-10-CM | POA: Diagnosis not present

## 2015-10-17 DIAGNOSIS — I4891 Unspecified atrial fibrillation: Secondary | ICD-10-CM

## 2015-10-17 LAB — POCT INR: INR: 1.6

## 2015-10-23 ENCOUNTER — Other Ambulatory Visit: Payer: Self-pay | Admitting: Cardiovascular Disease

## 2015-10-24 ENCOUNTER — Other Ambulatory Visit: Payer: Self-pay | Admitting: Family Medicine

## 2015-10-27 ENCOUNTER — Ambulatory Visit (INDEPENDENT_AMBULATORY_CARE_PROVIDER_SITE_OTHER): Payer: Medicare PPO

## 2015-10-27 DIAGNOSIS — Z9889 Other specified postprocedural states: Secondary | ICD-10-CM

## 2015-10-27 DIAGNOSIS — I059 Rheumatic mitral valve disease, unspecified: Secondary | ICD-10-CM

## 2015-10-27 DIAGNOSIS — I4891 Unspecified atrial fibrillation: Secondary | ICD-10-CM | POA: Diagnosis not present

## 2015-10-27 DIAGNOSIS — Z5181 Encounter for therapeutic drug level monitoring: Secondary | ICD-10-CM | POA: Diagnosis not present

## 2015-10-27 LAB — POCT INR: INR: 3.5

## 2015-10-27 NOTE — Telephone Encounter (Signed)
Medication filled to pharmacy as requested.   

## 2015-10-30 ENCOUNTER — Other Ambulatory Visit: Payer: Self-pay | Admitting: Family Medicine

## 2015-10-30 NOTE — Telephone Encounter (Signed)
Medication filled to pharmacy as requested.   

## 2015-10-31 ENCOUNTER — Other Ambulatory Visit: Payer: Self-pay | Admitting: Family Medicine

## 2015-11-03 NOTE — Telephone Encounter (Signed)
Medication filled to pharmacy as requested.   

## 2015-11-13 ENCOUNTER — Ambulatory Visit (INDEPENDENT_AMBULATORY_CARE_PROVIDER_SITE_OTHER): Payer: Medicare PPO | Admitting: *Deleted

## 2015-11-13 DIAGNOSIS — Z5181 Encounter for therapeutic drug level monitoring: Secondary | ICD-10-CM | POA: Diagnosis not present

## 2015-11-13 DIAGNOSIS — I059 Rheumatic mitral valve disease, unspecified: Secondary | ICD-10-CM | POA: Diagnosis not present

## 2015-11-13 DIAGNOSIS — Z9889 Other specified postprocedural states: Secondary | ICD-10-CM

## 2015-11-13 DIAGNOSIS — I4891 Unspecified atrial fibrillation: Secondary | ICD-10-CM

## 2015-11-13 LAB — POCT INR: INR: 4.4

## 2015-11-24 ENCOUNTER — Emergency Department (HOSPITAL_COMMUNITY): Payer: Medicare PPO

## 2015-11-24 ENCOUNTER — Emergency Department (HOSPITAL_COMMUNITY)
Admission: EM | Admit: 2015-11-24 | Discharge: 2015-11-24 | Disposition: A | Discharge status: Home / Self Care | Payer: Medicare PPO | Attending: Emergency Medicine | Admitting: Emergency Medicine

## 2015-11-24 ENCOUNTER — Encounter (HOSPITAL_COMMUNITY): Payer: Self-pay

## 2015-11-24 DIAGNOSIS — S0083XA Contusion of other part of head, initial encounter: Secondary | ICD-10-CM | POA: Insufficient documentation

## 2015-11-24 DIAGNOSIS — S81802A Unspecified open wound, left lower leg, initial encounter: Secondary | ICD-10-CM | POA: Insufficient documentation

## 2015-11-24 DIAGNOSIS — S50311A Abrasion of right elbow, initial encounter: Secondary | ICD-10-CM | POA: Diagnosis not present

## 2015-11-24 DIAGNOSIS — S0990XA Unspecified injury of head, initial encounter: Secondary | ICD-10-CM | POA: Diagnosis not present

## 2015-11-24 DIAGNOSIS — S81812A Laceration without foreign body, left lower leg, initial encounter: Secondary | ICD-10-CM | POA: Diagnosis not present

## 2015-11-24 DIAGNOSIS — Y998 Other external cause status: Secondary | ICD-10-CM | POA: Diagnosis not present

## 2015-11-24 DIAGNOSIS — W01198A Fall on same level from slipping, tripping and stumbling with subsequent striking against other object, initial encounter: Secondary | ICD-10-CM | POA: Insufficient documentation

## 2015-11-24 DIAGNOSIS — Y9389 Activity, other specified: Secondary | ICD-10-CM | POA: Diagnosis not present

## 2015-11-24 DIAGNOSIS — Y9289 Other specified places as the place of occurrence of the external cause: Secondary | ICD-10-CM | POA: Insufficient documentation

## 2015-11-24 LAB — BASIC METABOLIC PANEL
Anion gap: 17 — ABNORMAL HIGH (ref 5–15)
BUN: 36 mg/dL — AB (ref 6–20)
CALCIUM: 8.9 mg/dL (ref 8.9–10.3)
CO2: 25 mmol/L (ref 22–32)
CREATININE: 1.8 mg/dL — AB (ref 0.44–1.00)
Chloride: 96 mmol/L — ABNORMAL LOW (ref 101–111)
GFR calc non Af Amer: 25 mL/min — ABNORMAL LOW (ref >60)
GFR, EST AFRICAN AMERICAN: 29 mL/min — AB (ref >60)
Glucose, Bld: 92 mg/dL (ref 65–99)
Potassium: 3.8 mmol/L (ref 3.5–5.1)
SODIUM: 138 mmol/L (ref 135–145)

## 2015-11-24 LAB — CBC WITH DIFFERENTIAL/PLATELET
BASOS PCT: 1 %
Basophils Absolute: 0 10*3/uL (ref 0.0–0.1)
EOS ABS: 0.3 10*3/uL (ref 0.0–0.7)
Eosinophils Relative: 4 %
HEMATOCRIT: 35.4 % — AB (ref 36.0–46.0)
Hemoglobin: 11.1 g/dL — ABNORMAL LOW (ref 12.0–15.0)
Lymphocytes Relative: 12 %
Lymphs Abs: 0.8 10*3/uL (ref 0.7–4.0)
MCH: 32.5 pg (ref 26.0–34.0)
MCHC: 31.4 g/dL (ref 30.0–36.0)
MCV: 103.5 fL — ABNORMAL HIGH (ref 78.0–100.0)
MONO ABS: 0.4 10*3/uL (ref 0.1–1.0)
MONOS PCT: 6 %
Neutro Abs: 5 10*3/uL (ref 1.7–7.7)
Neutrophils Relative %: 77 %
Platelets: 146 10*3/uL — ABNORMAL LOW (ref 150–400)
RBC: 3.42 MIL/uL — ABNORMAL LOW (ref 3.87–5.11)
RDW: 16.5 % — AB (ref 11.5–15.5)
WBC: 6.5 10*3/uL (ref 4.0–10.5)

## 2015-11-24 LAB — PROTIME-INR
INR: 2.77 — AB (ref 0.00–1.49)
Prothrombin Time: 28.8 seconds — ABNORMAL HIGH (ref 11.6–15.2)

## 2015-11-24 NOTE — Discharge Instructions (Signed)
Laceration Care, Adult  A laceration is a cut that goes through all layers of the skin. The cut also goes into the tissue that is right under the skin. Some cuts heal on their own. Others need to be closed with stitches (sutures), staples, skin adhesive strips, or wound glue. Taking care of your cut lowers your risk of infection and helps your cut to heal better.  HOW TO TAKE CARE OF YOUR CUT  For stitches or staples:  · Keep the wound clean and dry.  · If you were given a bandage (dressing), you should change it at least one time per day or as told by your doctor. You should also change it if it gets wet or dirty.  · Keep the wound completely dry for the first 24 hours or as told by your doctor. After that time, you may take a shower or a bath. However, make sure that the wound is not soaked in water until after the stitches or staples have been removed.  · Clean the wound one time each day or as told by your doctor:    Wash the wound with soap and water.    Rinse the wound with water until all of the soap comes off.    Pat the wound dry with a clean towel. Do not rub the wound.  · After you clean the wound, put a thin layer of antibiotic ointment on it as told by your doctor. This ointment:    Helps to prevent infection.    Keeps the bandage from sticking to the wound.  · Have your stitches or staples removed as told by your doctor.  If your doctor used skin adhesive strips:   · Keep the wound clean and dry.  · If you were given a bandage, you should change it at least one time per day or as told by your doctor. You should also change it if it gets dirty or wet.  · Do not get the skin adhesive strips wet. You can take a shower or a bath, but be careful to keep the wound dry.  · If the wound gets wet, pat it dry with a clean towel. Do not rub the wound.  · Skin adhesive strips fall off on their own. You can trim the strips as the wound heals. Do not remove any strips that are still stuck to the wound. They will  fall off after a while.  If your doctor used wound glue:  · Try to keep your wound dry, but you may briefly wet it in the shower or bath. Do not soak the wound in water, such as by swimming.  · After you take a shower or a bath, gently pat the wound dry with a clean towel. Do not rub the wound.  · Do not do any activities that will make you really sweaty until the skin glue has fallen off on its own.  · Do not apply liquid, cream, or ointment medicine to your wound while the skin glue is still on.  · If you were given a bandage, you should change it at least one time per day or as told by your doctor. You should also change it if it gets dirty or wet.  · If a bandage is placed over the wound, do not let the tape for the bandage touch the skin glue.  · Do not pick at the glue. The skin glue usually stays on for 5-10 days. Then, it   falls off of the skin.  General Instructions   · To help prevent scarring, make sure to cover your wound with sunscreen whenever you are outside after stitches are removed, after adhesive strips are removed, or when wound glue stays in place and the wound is healed. Make sure to wear a sunscreen of at least 30 SPF.  · Take over-the-counter and prescription medicines only as told by your doctor.  · If you were given antibiotic medicine or ointment, take or apply it as told by your doctor. Do not stop using the antibiotic even if your wound is getting better.  · Do not scratch or pick at the wound.  · Keep all follow-up visits as told by your doctor. This is important.  · Check your wound every day for signs of infection. Watch for:    Redness, swelling, or pain.    Fluid, blood, or pus.  · Raise (elevate) the injured area above the level of your heart while you are sitting or lying down, if possible.  GET HELP IF:  · You got a tetanus shot and you have any of these problems at the injection site:    Swelling.    Very bad pain.    Redness.    Bleeding.  · You have a fever.  · A wound that was  closed breaks open.  · You notice a bad smell coming from your wound or your bandage.  · You notice something coming out of the wound, such as wood or glass.  · Medicine does not help your pain.  · You have more redness, swelling, or pain at the site of your wound.  · You have fluid, blood, or pus coming from your wound.  · You notice a change in the color of your skin near your wound.  · You need to change the bandage often because fluid, blood, or pus is coming from the wound.  · You start to have a new rash.  · You start to have numbness around the wound.  GET HELP RIGHT AWAY IF:  · You have very bad swelling around the wound.  · Your pain suddenly gets worse and is very bad.  · You notice painful lumps near the wound or on skin that is anywhere on your body.  · You have a red streak going away from your wound.  · The wound is on your hand or foot and you cannot move a finger or toe like you usually can.  · The wound is on your hand or foot and you notice that your fingers or toes look pale or bluish.     This information is not intended to replace advice given to you by your health care provider. Make sure you discuss any questions you have with your health care provider.     Document Released: 04/19/2008 Document Revised: 03/18/2015 Document Reviewed: 10/28/2014  Elsevier Interactive Patient Education ©2016 [employer]

## 2015-11-24 NOTE — ED Provider Notes (Signed)
CSN: [id number]     Arrival date & time 11/24/15  1524 History   First MD Initiated Contact with Patient 11/24/15 1557     Chief Complaint  Patient presents with  . Fall   HPI Pt presents to the ED after a fall.  She was turning when she lost her balance and fell.  Pt did not feel weak or dizzy.  She has not been feeling ill.  She thinks she just lost her balance.  She fell striking the back of her head on the floow.  She also scraped her right elbow and re opened a lower leg wound on the left side.  Patient was able to get up and stand after her fall. No fevers or chills.  No vomiting or diarrhea.  No chest pain, shortness of breath or weakness. No neck or back pain. Past Medical History  Diagnosis Date  . S/P MVR (mitral valve replacement) 2005    [facility] 2005  [facility]  . Diastolic CHF, chronic ([place])   . Hypertension   . Pulmonary hypertension ([place])   . Arthritis   . TIA (transient ischemic attack)   . Diabetes mellitus   . Hypercholesterolemia   . TIA (transient ischemic attack)   . Anemia   . CRF (chronic renal failure)   . S/P tricuspid valve repair 2005    [facility]  . Atrial fibrillation [facility])    Past Surgical History  Procedure Laterality Date  . Knee arthroscopy      06/2007- Right knee Dr. Collier [place]  . Mitral valve replacement      TV annuloplasty- [id number]  . Shoulder surgery      rotato cuff 02/2006 [facility]  . Abdominal hysterectomy    . Hip fracture surgery      L hip  . Tonsillectomy    . Appendectomy    . Hernia repair     Family History  Problem Relation Age of Onset  . Asthma Daughter   . Allergies Daughter   . Allergies Daughter   . Cancer Sister     bladder   Social History  Substance Use Topics  . Smoking status: Never Smoker   . Smokeless tobacco: Never Used  . Alcohol Use: No   OB History    No data available     Review of Systems  Cardiovascular: Positive for leg swelling (chronic).  Musculoskeletal:       Hx of old  wound left lower leg that is slowly improving   All other systems reviewed and are negative.     Allergies  Meperidine hcl; Sulfamethoxazole-trimethoprim; and Heparin  Home Medications   Prior to Admission medications   Medication Sig Start Date End Date Taking? Authorizing Provider  alendronate (FOSAMAX) 35 MG tablet TAKE 1 TABLET EVERY 7 DAYS IN THE MORNING AS DIRECTED. SEE PACKAGE FOR ADDITIONAL INSTRUCTIONS 11/03/15   Midge Minium, MD  aspirin 81 MG tablet Take 81 mg by mouth daily.      Historical Provider, MD  atorvastatin (LIPITOR) 20 MG tablet Take 20 mg by mouth daily.    Historical Provider, MD  blood glucose meter kit and supplies KIT Dispense based on patient and insurance preference. Use up to four times daily as directed. (Dx. E11.59). 10/01/15   Midge Minium, MD  busPIRone (BUSPAR) 7.5 MG tablet TAKE 1 TABLET BY MOUTH TWICE DAILY 09/01/15   Midge Minium, MD  carvedilol (COREG) 25 MG tablet Take 0.5 tablets (12.5 mg  total) by mouth daily. 10/23/15   Josue Hector, MD  Cholecalciferol (VITAMIN D3) 5000 UNITS CAPS Take 1 capsule by mouth daily.    Historical Provider, MD  Diphenhydramine-APAP, sleep, (TYLENOL PM EXTRA STRENGTH PO) Take 1 tablet by mouth daily as needed (for sleep). As needed    Historical Provider, MD  docusate sodium (COLACE) 100 MG capsule Take 100 mg by mouth 2 (two) times daily.      Historical Provider, MD  fenofibrate 160 MG tablet Take 160 mg by mouth daily.    Historical Provider, MD  folic acid (FOLVITE) 644 MCG tablet Take 400 mcg by mouth daily.      Historical Provider, MD  furosemide (LASIX) 80 MG tablet Take 1 tablet (80 mg total) by mouth 2 (two) times daily. 09/25/15   Josue Hector, MD  glipiZIDE (GLUCOTROL XL) 2.5 MG 24 hr tablet TAKE 1 TABLET BY MOUTH DAILY 07/29/15   Midge Minium, MD  HYDROcodone-acetaminophen (NORCO/VICODIN) 5-325 MG tablet Take 1 tablet by mouth every 6 (six) hours as needed for moderate pain.     Historical Provider, MD  isosorbide mononitrate (IMDUR) 30 MG 24 hr tablet Take 30 mg by mouth daily.    Historical Provider, MD  methocarbamol (ROBAXIN) 500 MG tablet Take 500 mg by mouth 2 (two) times daily as needed for muscle spasms.    Historical Provider, MD  ONE TOUCH ULTRA TEST test strip USE TO TEST BLOOD SUGAR FOUR TIMES DAILY AS DIRECTED 10/27/15   Midge Minium, MD  [facility] DELICA LANCETS 03K MISC USE TO TEST BLOOD SUGAR FOUR TIMES DAILY AS DIRECTED 10/30/15   Midge Minium, MD  oxyCODONE-acetaminophen (ROXICET) 5-325 MG per tablet Take 1 tablet by mouth every 8 (eight) hours as needed for severe pain. 07/01/15   Midge Minium, MD  sertraline (ZOLOFT) 50 MG tablet Take 50 mg by mouth daily. 08/26/15   Historical Provider, MD  spironolactone (ALDACTONE) 25 MG tablet Take 25 mg by mouth daily.    Historical Provider, MD  triamcinolone cream (KENALOG) 0.1 % Apply 1 application topically 2 (two) times daily. 04/11/15   Midge Minium, MD  warfarin (COUMADIN) 2.5 MG tablet TAKE AS DIRECTED 07/23/15   Josue Hector, MD   BP 145/65 mmHg  Pulse 67  Temp(Src) 97.9 F (36.6 C) (Oral)  Resp 25  SpO2 97% Physical Exam  Constitutional: No distress.  Elderly frail   HENT:  Head: Normocephalic and atraumatic.  Right Ear: External ear normal.  Left Ear: External ear normal.  Small hematoma right posterior occiput   Eyes: Conjunctivae are normal. Right eye exhibits no discharge. Left eye exhibits no discharge. No scleral icterus.  Neck: Neck supple. No tracheal deviation present.  Cardiovascular: Normal rate, regular rhythm and intact distal pulses.   Pulmonary/Chest: Effort normal and breath sounds normal. No stridor. No respiratory distress. She has no wheezes. She has no rales.  Abdominal: Soft. Bowel sounds are normal. She exhibits no distension. There is no tenderness. There is no rebound and no guarding.  Musculoskeletal: She exhibits no edema or tenderness.        Right shoulder: Normal.       Left shoulder: Normal.       Right elbow: Normal.      Left elbow: Normal.       Right wrist: Normal.       Left wrist: Normal.       Right hip: Normal.  Left hip: Normal.       Right knee: Normal.       Left knee: Normal.       Right ankle: Normal.       Left ankle: Normal.       Cervical back: Normal.       Thoracic back: Normal.       Lumbar back: Normal.       Legs: Superficial skin tear laceration left lower leg approx 3x4 cm, small abrasion right elbow  Neurological: She is alert. She has normal strength. No cranial nerve deficit (no facial droop, extraocular movements intact, no slurred speech) or sensory deficit. She exhibits normal muscle tone. She displays no seizure activity. Coordination normal.  Skin: Skin is warm and dry. No rash noted.  Psychiatric: She has a normal mood and affect.  Nursing note and vitals reviewed.   ED Course  .Marland KitchenLaceration Repair Date/Time: 11/24/2015 5:58 PM Performed by: Dorie Rank Authorized by: Dorie Rank Body area: lower extremity Laceration length: 5 (irregular skin tear with avulsed tissue) cm Comments: Wound was irrigated.  Debrided a small amount of devitalized tissue, bacitracin and bandage place.  No sutures or steri strips   (including critical care time) Labs Review Labs Reviewed  CBC WITH DIFFERENTIAL/PLATELET - Abnormal; Notable for the following:    RBC 3.42 (*)    Hemoglobin 11.1 (*)    HCT 35.4 (*)    MCV 103.5 (*)    RDW 16.5 (*)    Platelets 146 (*)    All other components within normal limits  BASIC METABOLIC PANEL - Abnormal; Notable for the following:    Chloride 96 (*)    BUN 36 (*)    Creatinine, Ser 1.80 (*)    GFR calc non Af Amer 25 (*)    GFR calc Af Amer 29 (*)    Anion gap 17 (*)    All other components within normal limits  PROTIME-INR - Abnormal; Notable for the following:    Prothrombin Time 28.8 (*)    INR 2.77 (*)    All other components within normal limits     Imaging Review Ct Head Wo Contrast  11/24/2015  CLINICAL DATA:  [place] backwards at home striking back of head, small hematoma, headache, head injury, history hypertension, pulmonary hypertension, diabetes mellitus, TIA, atrial fibrillation, diastolic CHF EXAM: CT HEAD WITHOUT CONTRAST TECHNIQUE: Contiguous axial images were obtained from the base of the skull through the vertex without intravenous contrast. COMPARISON:  06/05/2015 FINDINGS: Normal ventricular morphology. No midline shift or mass effect. Normal appearance of brain parenchyma. No intracranial hemorrhage, mass lesion, or evidence acute infarction. No extra-axial fluid collections. Bones appear demineralized but intact. Small LEFT mastoid effusion. Paranasal sinuses and RIGHT mastoid air cells otherwise clear. Atherosclerotic calcifications at the carotid siphons and vertebral arteries. IMPRESSION: No acute intracranial abnormalities. Small LEFT mastoid effusion new since prior exam. Electronically Signed   By: Lavonia Dana M.D.   On: 11/24/2015 17:08   Cardiac monitor:  Irregular narrow rhythm, rate in the 60s.   MDM   Final diagnoses:  Head injury due to trauma, initial encounter  Leg laceration, left, initial encounter    Patient lost her balance and fell. In the emergency room she has no focal neurologic deficits. She otherwise is alert and feels well. He does not show any serious injuries. Laboratory test show chronic renal sufficiency and anemia but this appears to be very similar to her baseline.  Local wound care was  provided.   At this time there does not appear to be any evidence of an acute emergency medical condition and the patient appears stable for discharge with appropriate outpatient follow up.     Dorie Rank, MD 11/24/15 [phone]

## 2015-11-24 NOTE — ED Notes (Signed)
Per EMS: Pt from home. Lost balance, fell backwards. Struck head, small hematoma, no bleeding to back of head. Denies any neck/back pain. Denies LOC. Pt had wound on leg, fall has re-opened wound. Pt is on coumadin. Also struck R elbow. Pt a/o x 4.

## 2015-11-27 ENCOUNTER — Ambulatory Visit (INDEPENDENT_AMBULATORY_CARE_PROVIDER_SITE_OTHER): Payer: Medicare PPO | Admitting: *Deleted

## 2015-11-27 DIAGNOSIS — I059 Rheumatic mitral valve disease, unspecified: Secondary | ICD-10-CM | POA: Diagnosis not present

## 2015-11-27 DIAGNOSIS — Z9889 Other specified postprocedural states: Secondary | ICD-10-CM | POA: Diagnosis not present

## 2015-11-27 DIAGNOSIS — I4891 Unspecified atrial fibrillation: Secondary | ICD-10-CM | POA: Diagnosis not present

## 2015-11-27 DIAGNOSIS — Z5181 Encounter for therapeutic drug level monitoring: Secondary | ICD-10-CM

## 2015-11-27 LAB — POCT INR: INR: 2.4

## 2015-11-28 IMAGING — CT CT CERVICAL SPINE W/O CM
2 of 3 series · 8 of 14 positions shown, 9 images · non-contrast
Comparison: CT dated 02/02/2010

CLINICAL DATA: 84-year-old female status post fall with head and
neck pain.

EXAM:
CT HEAD WITHOUT CONTRAST
CT CERVICAL SPINE WITHOUT CONTRAST
TECHNIQUE: Multidetector CT imaging of the head and cervical spine was
performed following the standard protocol without intravenous
contrast. Multiplanar CT image reconstructions of the cervical spine
were also generated.

[Series 3: head 2.4 h60s bone · axial · 0.45mm/px · z∈[+425,+515]mm · 4 of 64 slices shown]
[im 13/64  bone]
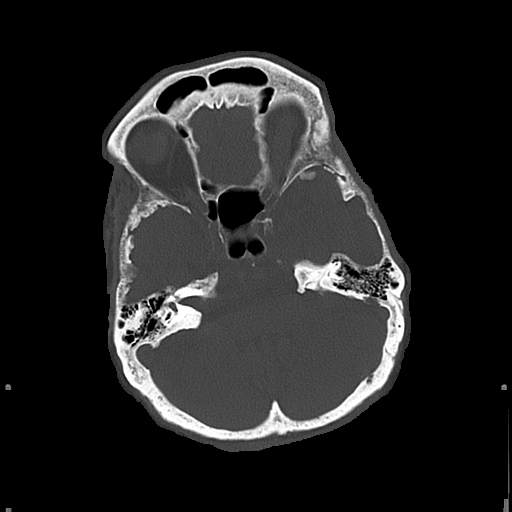
[im 26/64  bone]
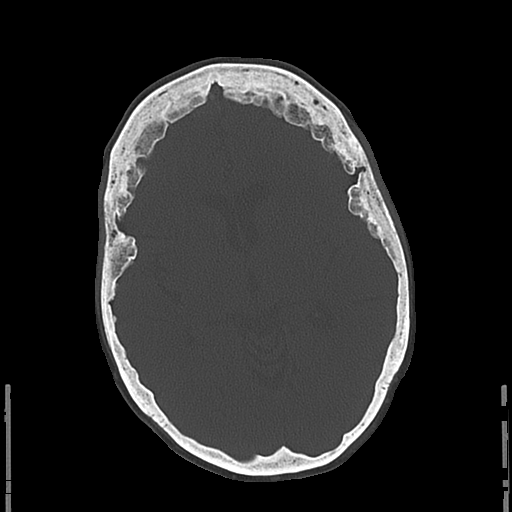
[im 38/64  bone]
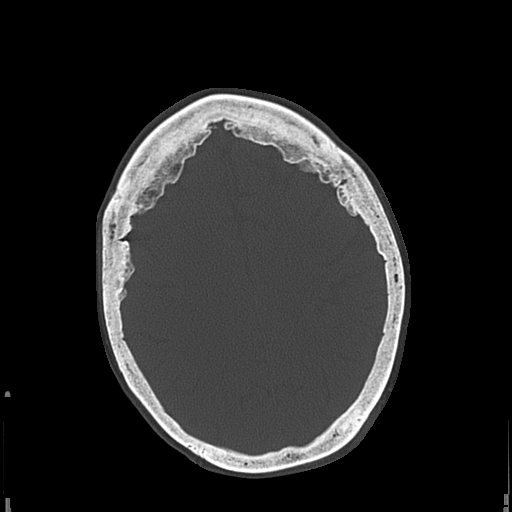
[im 51/64  bone]
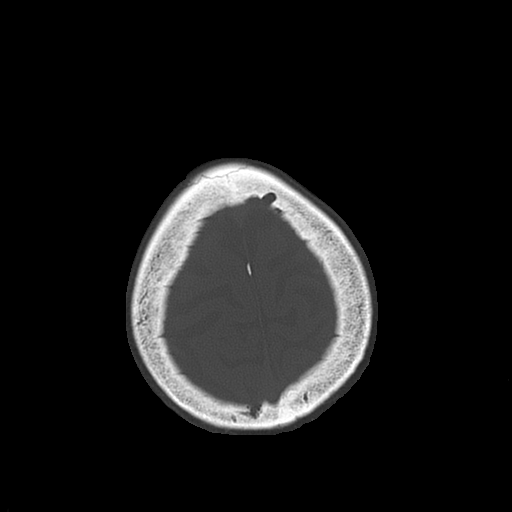

[Series 5: c_spine 2.0 b41s st · axial · 0.30mm/px · z∈[+294,+372]mm · 4 of 66 slices shown, 5 images]
[im 14/66  soft-tissue]
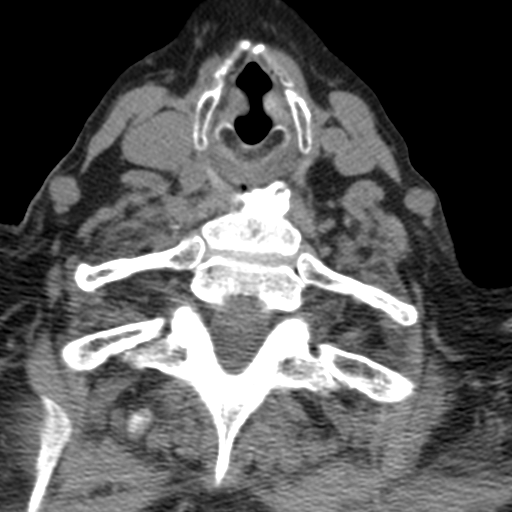
[im 14/66  bone]
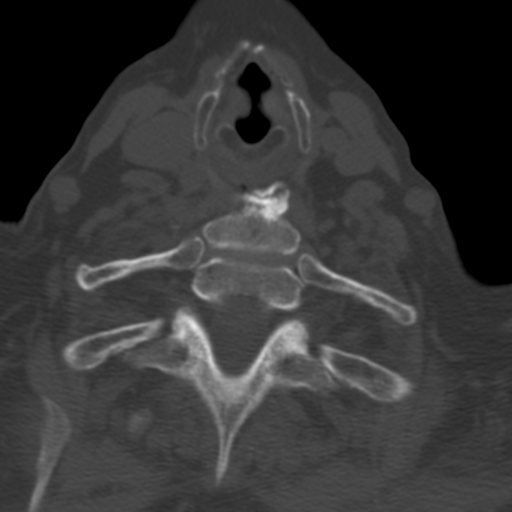
[im 27/66  bone]
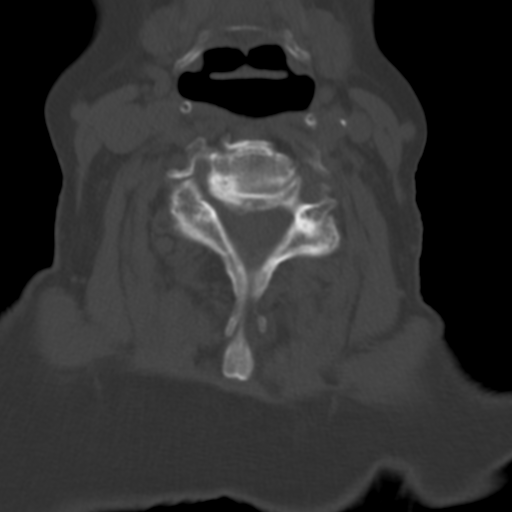
[im 40/66  bone]
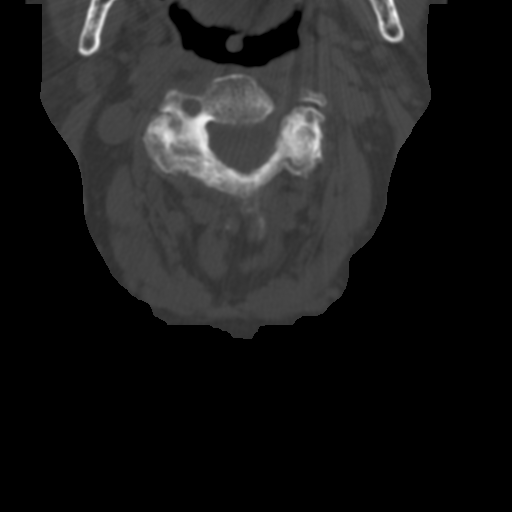
[im 53/66  bone]
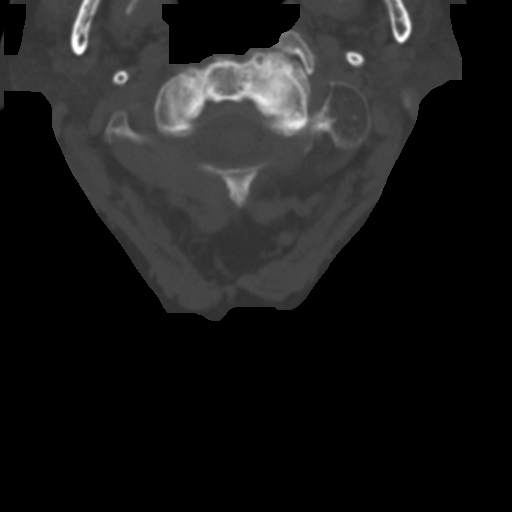

[8 of 14 positions shown; findings below may reference images not displayed]

FINDINGS: CT HEAD FINDINGS

There is slight prominence of the ventricles and sulci compatible
with age-related volume loss. Mild periventricular and deep white
matter hypodensities represent chronic microvascular ischemic
changes. There is no intracranial hemorrhage. No mass effect or
midline shift identified.

The visualized paranasal sinuses and mastoid air cells are well
aerated. The calvarium is intact. There is hyperostosis frontalis.
Left occipital scalp bruising.

CT CERVICAL SPINE FINDINGS

There is no acute fracture or subluxation of the cervical
spine.Multilevel degenerative changes with osteophytes predominantly
involving C1-C2 and C5-C7. There are posterior osteophytes at C5-C6
and C6-C7 with mild narrowing of the central canal. There are
diffuse facet hypertrophy. The odontoid and spinous processes are
intact.There is normal anatomic alignment of the C1-C2 lateral
masses. The visualized soft tissues appear unremarkable.
IMPRESSION: No acute intracranial pathology.

Mild age-related atrophy and chronic microvascular ischemic disease.

No acute/traumatic cervical spine injury.

## 2015-11-28 IMAGING — CR DG ELBOW COMPLETE 3+V*R*
4 series · 4 of 4 positions shown · non-contrast
Comparison: None.

CLINICAL DATA: 84-year-old female with fall and right arm pain

EXAM:
RIGHT ELBOW - COMPLETE 3+ VIEW

[x elbow joint ap right]
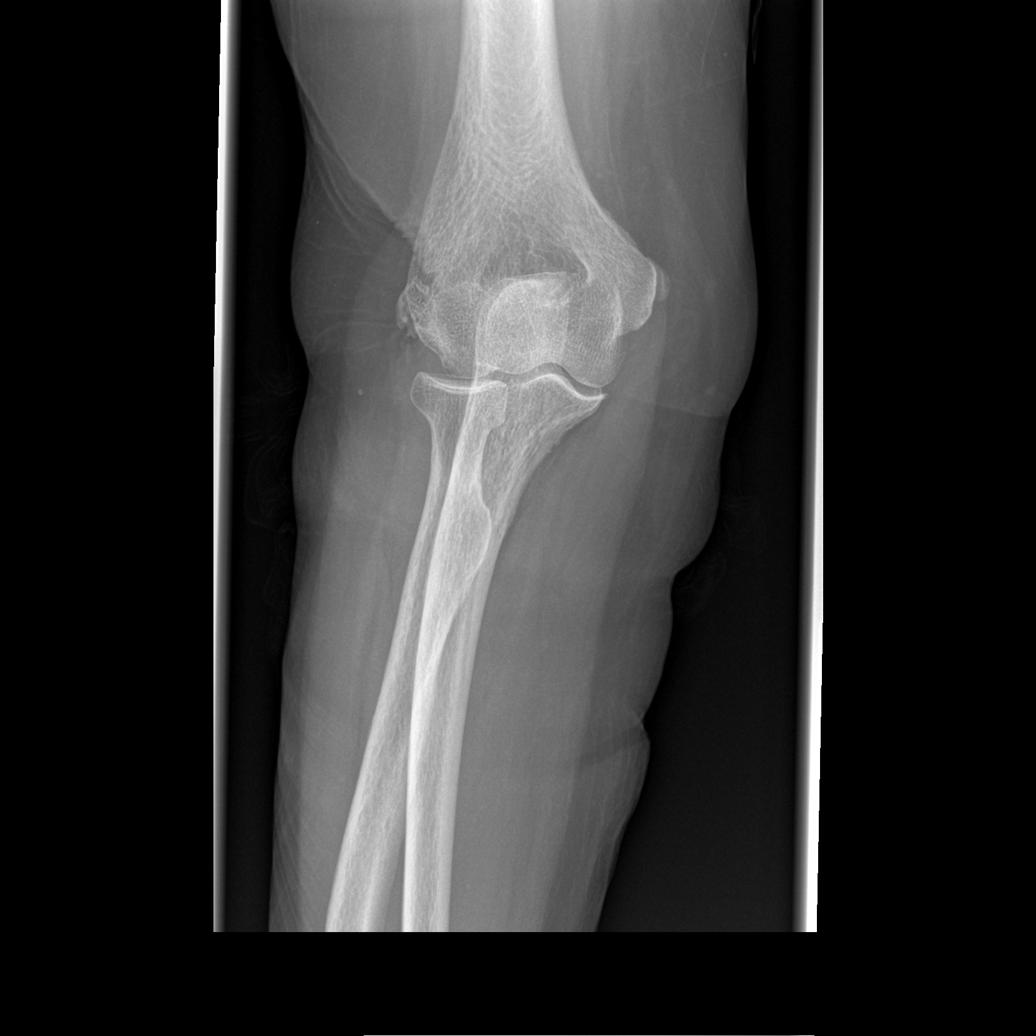

[x elbow joint obl. right (1 of 2)]
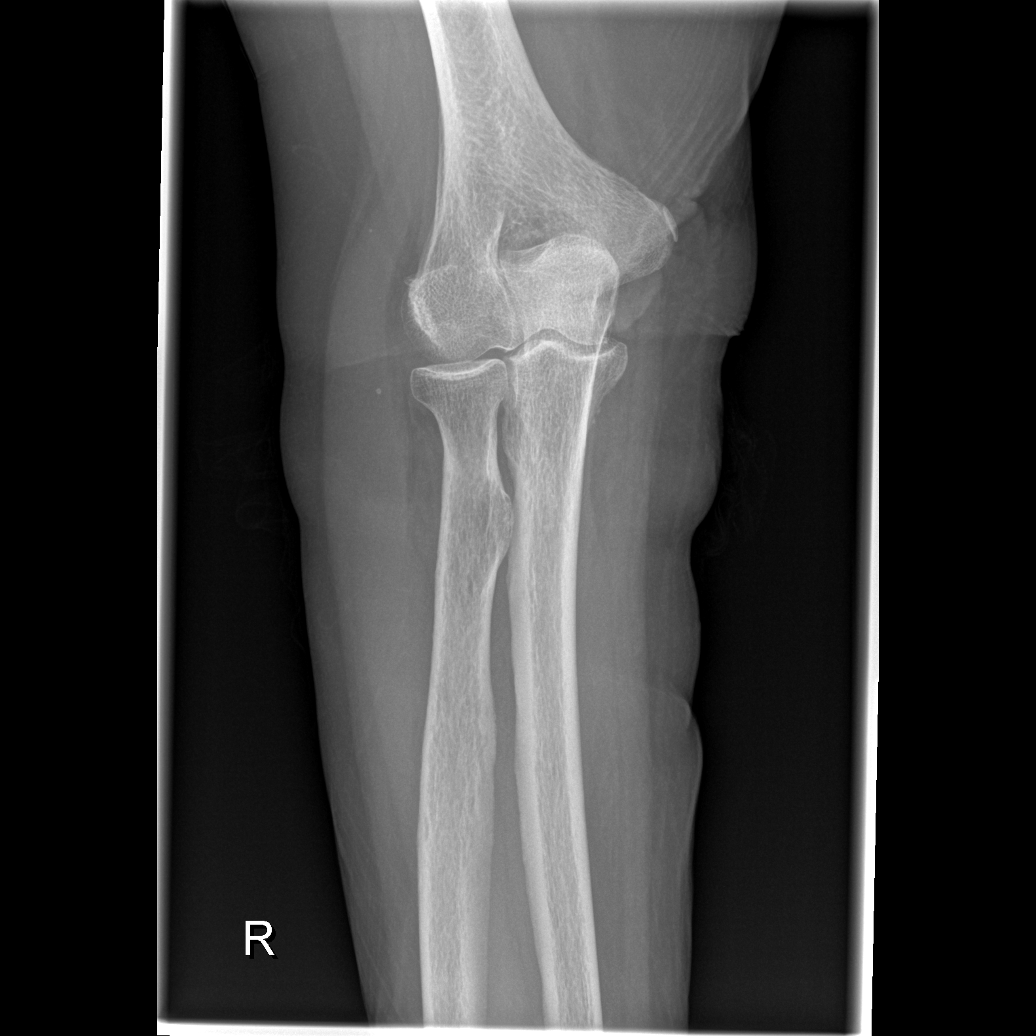

[x elbow joint obl. right (2 of 2)]
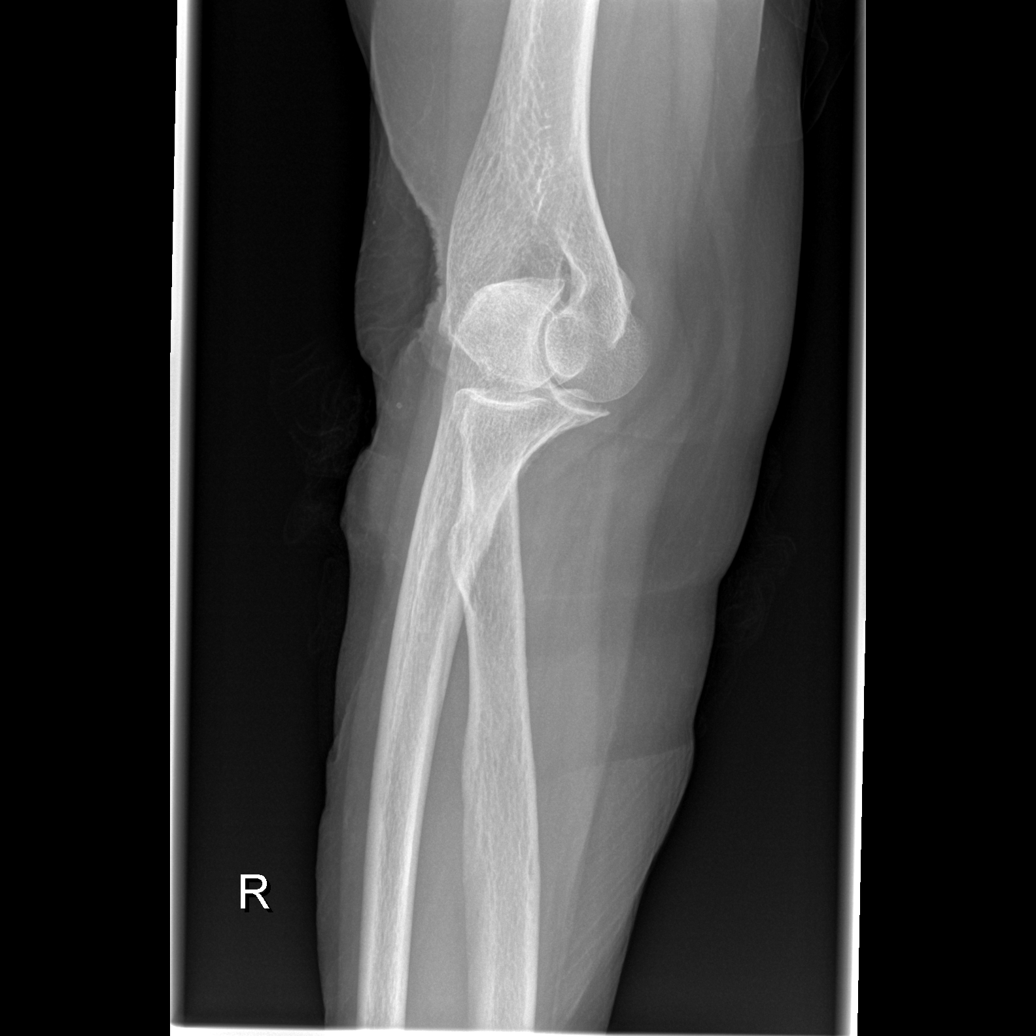

[x elbow joint lat right]
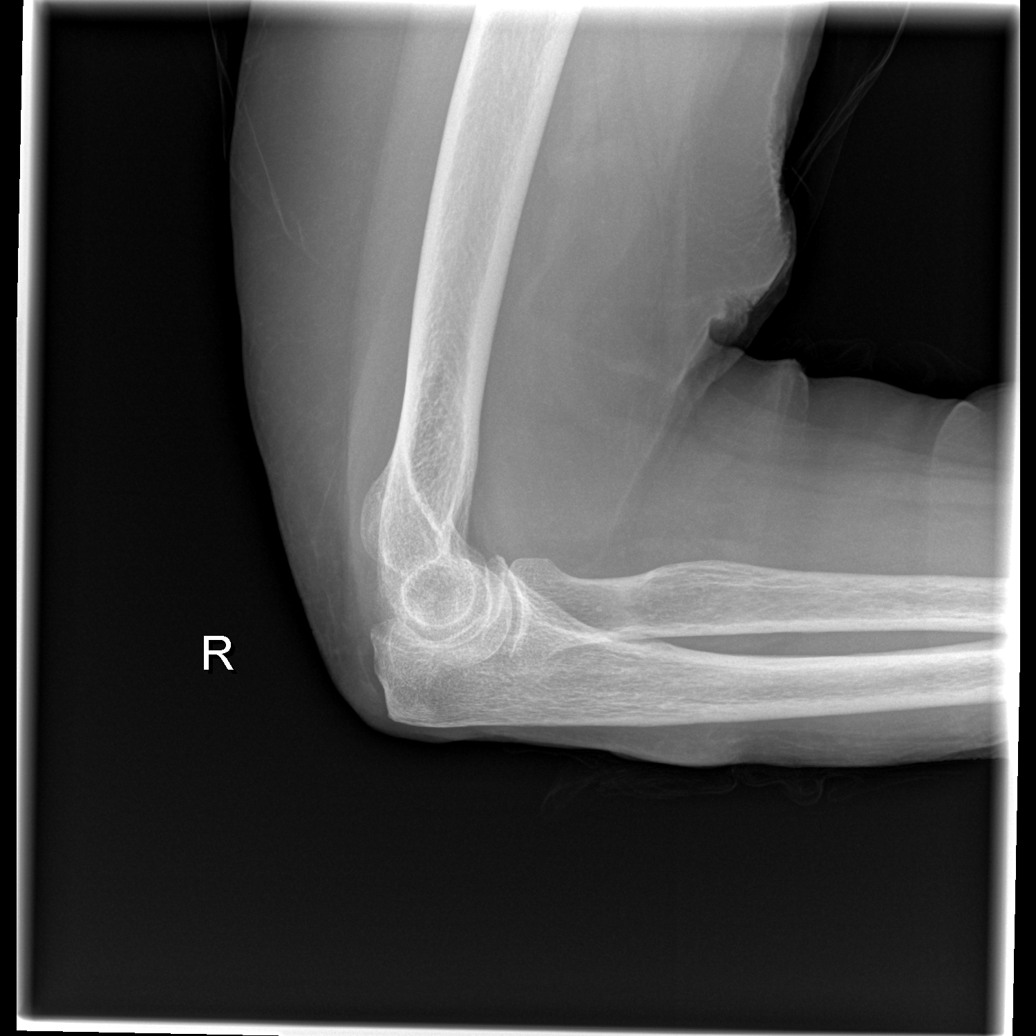

[4 of 4 positions shown; findings below may reference images not displayed]

FINDINGS: There is no evidence of fracture, dislocation, or joint effusion.
Soft tissues are unremarkable.
IMPRESSION: No acute fracture or dislocation.

## 2015-12-04 ENCOUNTER — Encounter: Payer: Self-pay | Admitting: Family Medicine

## 2015-12-04 ENCOUNTER — Ambulatory Visit (INDEPENDENT_AMBULATORY_CARE_PROVIDER_SITE_OTHER): Payer: Medicare PPO | Admitting: Family Medicine

## 2015-12-04 VITALS — BP 148/72 | HR 65 | Temp 97.9°F | Wt 163.6 lb

## 2015-12-04 DIAGNOSIS — I272 Other secondary pulmonary hypertension: Secondary | ICD-10-CM | POA: Diagnosis not present

## 2015-12-04 DIAGNOSIS — E1151 Type 2 diabetes mellitus with diabetic peripheral angiopathy without gangrene: Secondary | ICD-10-CM | POA: Diagnosis not present

## 2015-12-04 DIAGNOSIS — I10 Essential (primary) hypertension: Secondary | ICD-10-CM | POA: Diagnosis not present

## 2015-12-04 DIAGNOSIS — Z0001 Encounter for general adult medical examination with abnormal findings: Secondary | ICD-10-CM | POA: Diagnosis not present

## 2015-12-04 DIAGNOSIS — E559 Vitamin D deficiency, unspecified: Secondary | ICD-10-CM | POA: Diagnosis not present

## 2015-12-04 DIAGNOSIS — E7800 Pure hypercholesterolemia, unspecified: Secondary | ICD-10-CM

## 2015-12-04 DIAGNOSIS — R296 Repeated falls: Secondary | ICD-10-CM | POA: Insufficient documentation

## 2015-12-04 DIAGNOSIS — Z0000 Encounter for general adult medical examination without abnormal findings: Secondary | ICD-10-CM

## 2015-12-04 DIAGNOSIS — J441 Chronic obstructive pulmonary disease with (acute) exacerbation: Secondary | ICD-10-CM

## 2015-12-04 DIAGNOSIS — IMO0002 Reserved for concepts with insufficient information to code with codable children: Secondary | ICD-10-CM

## 2015-12-04 DIAGNOSIS — S81802A Unspecified open wound, left lower leg, initial encounter: Secondary | ICD-10-CM | POA: Insufficient documentation

## 2015-12-04 LAB — CBC WITH DIFFERENTIAL/PLATELET
BASOS PCT: 0.1 % (ref 0.0–3.0)
Basophils Absolute: 0 10*3/uL (ref 0.0–0.1)
Eosinophils Absolute: 0.1 10*3/uL (ref 0.0–0.7)
Eosinophils Relative: 1.6 % (ref 0.0–5.0)
HEMATOCRIT: 37.3 % (ref 36.0–46.0)
Hemoglobin: 12.1 g/dL (ref 12.0–15.0)
LYMPHS ABS: 0.9 10*3/uL (ref 0.7–4.0)
LYMPHS PCT: 13.3 % (ref 12.0–46.0)
MCHC: 32.4 g/dL (ref 30.0–36.0)
MCV: 98.5 fl (ref 78.0–100.0)
MONOS PCT: 5.1 % (ref 3.0–12.0)
Monocytes Absolute: 0.3 10*3/uL (ref 0.1–1.0)
NEUTROS ABS: 5.1 10*3/uL (ref 1.4–7.7)
Neutrophils Relative %: 79.9 % — ABNORMAL HIGH (ref 43.0–77.0)
PLATELETS: 110 10*3/uL — AB (ref 150.0–400.0)
RBC: 3.78 Mil/uL — ABNORMAL LOW (ref 3.87–5.11)
RDW: 17.4 % — AB (ref 11.5–15.5)
WBC: 6.4 10*3/uL (ref 4.0–10.5)

## 2015-12-04 LAB — BASIC METABOLIC PANEL
BUN: 30 mg/dL — ABNORMAL HIGH (ref 6–23)
CALCIUM: 9.4 mg/dL (ref 8.4–10.5)
CO2: 33 meq/L — AB (ref 19–32)
Chloride: 100 mEq/L (ref 96–112)
Creatinine, Ser: 1.43 mg/dL — ABNORMAL HIGH (ref 0.40–1.20)
GFR: 37.23 mL/min — ABNORMAL LOW (ref >60.00)
Glucose, Bld: 102 mg/dL — ABNORMAL HIGH (ref 70–99)
POTASSIUM: 3.7 meq/L (ref 3.5–5.1)
SODIUM: 145 meq/L (ref 135–145)

## 2015-12-04 LAB — LIPID PANEL
CHOLESTEROL: 98 mg/dL (ref 0–200)
HDL: 24.3 mg/dL — ABNORMAL LOW (ref >39.00)
LDL Cholesterol: 52 mg/dL (ref 0–99)
NONHDL: 73.45
Total CHOL/HDL Ratio: 4
Triglycerides: 105 mg/dL (ref 0.0–149.0)
VLDL: 21 mg/dL (ref 0.0–40.0)

## 2015-12-04 LAB — HEPATIC FUNCTION PANEL
ALK PHOS: 37 U/L — AB (ref 39–117)
ALT: 17 U/L (ref 0–35)
AST: 38 U/L — AB (ref 0–37)
Albumin: 4.1 g/dL (ref 3.5–5.2)
BILIRUBIN DIRECT: 0.7 mg/dL — AB (ref 0.0–0.3)
TOTAL PROTEIN: 7 g/dL (ref 6.0–8.3)
Total Bilirubin: 1.6 mg/dL — ABNORMAL HIGH (ref 0.2–1.2)

## 2015-12-04 LAB — HEMOGLOBIN A1C: HEMOGLOBIN A1C: 6 % (ref 4.6–6.5)

## 2015-12-04 LAB — TSH: TSH: 2.45 u[IU]/mL (ref 0.35–4.50)

## 2015-12-04 LAB — VITAMIN D 25 HYDROXY (VIT D DEFICIENCY, FRACTURES): VITD: 105.7 ng/mL (ref 30.00–100.00)

## 2015-12-04 MED ORDER — AMOXICILLIN-POT CLAVULANATE 875-125 MG PO TABS: 1.0000 | ORAL_TABLET | Freq: Two times a day (BID) | ORAL | Status: DC

## 2015-12-04 MED ORDER — ALBUTEROL SULFATE (2.5 MG/3ML) 0.083% IN NEBU: 2.5000 mg | INHALATION_SOLUTION | Freq: Four times a day (QID) | RESPIRATORY_TRACT | Status: AC | PRN

## 2015-12-04 NOTE — Progress Notes (Signed)
Pre visit review using our clinic review tool, if applicable. No additional management support is needed unless otherwise documented below in the visit note. 

## 2015-12-04 NOTE — Progress Notes (Signed)
Subjective:    Patient ID: Kelsey Cochran, female    DOB: [birth year], 82 y.o.   MRN: [id number]  HPI Here today for CPE.  Risk Factors: DM- chronic problem, on Glipizide.  UTD on eye exam, foot exam.  UTD on microalbumin.  Ran out of strips but prior to that, CBGs were good.  Denies symptomatic lows but family is concerned that she is not eating regularly.  HTN- chronic problem, BP slightly elevated today but has been taking decongestants this week.  On Spironolactone, Imdur, lasix, Coreg Hyperlipidemia- chronic problem, on Lipitor, Fenofibrate. Pulmonary HTN- on Spironolactone and Lasix, following w/ Dr Delford Field Afib- on chronic coumadin, Carvedilol Osteoporosis- chronic problem, on Fosamax.  Due for DEXA this year but pt declines L lateral lower leg wound- daughter reports this 1st occurred ~2 months ago but was reinjured recently in a fall.  Area is still draining, not healing. COPD- chest congestion, SOB, wet cough, no fevers. Physical Activity: limited, ambulates w/ walker Fall Risk: elevated, fell last week.  Has fallen multiple times Depression: ongoing issue for pt, on Buspar and Zoloft Hearing: decreased ADL's: independent. Cognitive: normal linear thought process, memory and attention intact Home Safety: safe at home, lives w/ daughter Height, Weight, BMI, Visual Acuity: see vitals, vision corrected to 20/20 w/ glasses Counseling: due for colonoscopy- pt declines.  UTD on mammo- pt states that was last one. Care Team Reviewed and updated Labs Ordered: See A&P Care Plan: See A&P    Review of Systems Patient reports no vision/ hearing changes, adenopathy,fever, weight change,  persistant/recurrent hoarseness , swallowing issues, chest pain, palpitations, edema, hemoptysis, gastrointestinal bleeding (melena, rectal bleeding), abdominal pain, significant heartburn, bowel changes, Gyn symptoms (abnormal  bleeding, pain),  syncope, memory loss, numbness & tingling, skin/hair/nail  changes, abnormal bruising or bleeding, anxiety, or depression.   + SOB- just above baseline, occuring w/o exertion + wet cough + urinary incontinence + LE weakness, 'she's just weak all over'    Objective:   Physical Exam General Appearance:    Alert, cooperative, no distress, appears stated age  Head:    Normocephalic, without obvious abnormality, atraumatic  Eyes:    PERRL, conjunctiva/corneas clear, EOM's intact, fundi    benign, both eyes  Ears:    Normal TM's and external ear canals, both ears  Nose:   Nares normal, septum midline, mucosa normal, no drainage    or sinus tenderness  Throat:   Lips, mucosa, and tongue normal; teeth and gums normal  Neck:   Supple, symmetrical, trachea midline, no adenopathy;    Thyroid: no enlargement/tenderness/nodules  Back:     Symmetric, no curvature, ROM normal, no CVA tenderness  Lungs:     Coarse BS diffusely w/ wet, hacking cough  Chest Wall:    No tenderness or deformity   Heart:    Irregularly irregular S1/S2 w/ III/VI SEM  Breast Exam:    Deferred  Abdomen:     Soft, non-tender, bowel sounds active all four quadrants,    no masses, no organomegaly  Genitalia:    Deferred  Rectal:    Extremities:   Extremities normal, atraumatic, no cyanosis or edema  Pulses:   2+ and symmetric all extremities  Skin:   L lower leg wound w/ granulation tissue present.  No surrounding erythema or induration  Lymph nodes:   Cervical, supraclavicular, and axillary nodes normal  Neurologic:   CNII-XII intact, normal strength, sensation and reflexes    throughout  Assessment & Plan:

## 2015-12-04 NOTE — Patient Instructions (Signed)
Follow up in 4-6 weeks to recheck how things are going We'll notify you of your lab results and make any changes if needed We'll call you with your wound appt Someone from home health will be in touch about your physical therapy START the Augmentin twice daily- take w/ food Use the Albuterol nebulizer every 4-6 hrs as needed for cough, shortness of breath, wheezing REST! Please call and schedule with the kidney doctor (the referral is in!) You have declined mammo, colonoscopy, bone density- I am in agreement with this plan Call with any questions or concerns or concerns Hang in there!!!

## 2015-12-05 ENCOUNTER — Telehealth: Payer: Self-pay | Admitting: *Deleted

## 2015-12-05 ENCOUNTER — Telehealth: Payer: Self-pay | Admitting: Family Medicine

## 2015-12-05 NOTE — Telephone Encounter (Signed)
Ok for social work and ok for them to contact Dr Eden Emms for orders

## 2015-12-05 NOTE — Telephone Encounter (Signed)
Kelsey Cochran with [facility] requesting verbal order for medical social worker & verbal ok to call if orders needed from Dr. Eden Emms with cardiology (they need PCP approval to get orders from any other doctor). Cleaned wound normal saline or soap and water and apply neosporin/antibacterial ointment and cover with nonstick gauze changing 1xday. Verbal for ok. Ph# [phone]  Plans to see pt 2 week 2, 1 week 2 weeks, 1 every 2 weeks for 4 weeks.

## 2015-12-05 NOTE — Telephone Encounter (Signed)
Relation to ZO:XWRU Call back [phone] Pharmacy: [id number] - [place], [place] - [address] AT [facility] [address] [phone] (Phone) [phone] (Fax)          Reason for call:  Patient stated she would like albuterol (PROVENTIL) (2.5 MG/3ML) 0.083% nebulizer solution and amoxicillin-clavulanate (AUGMENTIN) 875-125 MG tablet  [id number] - [place],  - [address] AT [facility] [address] [phone] (Phone) [phone] (Fax)

## 2015-12-05 NOTE — Telephone Encounter (Signed)
msg did not go thru, please call asap regarding wound care orders, needing verbal per [facility] with [facility] [phone]. She will be in home until approx 2:00pm today.

## 2015-12-05 NOTE — Telephone Encounter (Signed)
Verbal orders given to [facility] w/ [facility]. No other concerns/needs at this time.

## 2015-12-05 NOTE — Telephone Encounter (Signed)
LMOVM w/ Heather giving verbal order for wound care per Dr. Beverely Low. Advised to call the office if additional orders are needed.

## 2015-12-05 NOTE — Telephone Encounter (Signed)
PA initiated on [email or web address] [facility]), awaiting determination. JG//CMA

## 2015-12-07 NOTE — Assessment & Plan Note (Signed)
New.  Slow healing.  No current evidence of infxn but granulation tissue appears to need debridement to continue healing.  Refer to wound center and will also have home health perform dressing changes.  Pt expressed understanding and is in agreement w/ plan.

## 2015-12-07 NOTE — Assessment & Plan Note (Signed)
Chronic problem.  BP is elevated today but pt clearly isn't feeling well.  Will not make med changes at this time but will follow closely to determine if future changes are needed

## 2015-12-07 NOTE — Assessment & Plan Note (Signed)
Chronic problem, typically well controlled on Glipizide. UTD on foot exam, eye exam, microalbumin.  Check labs.  Adjust meds prn

## 2015-12-07 NOTE — Assessment & Plan Note (Signed)
chronic problem, tolerating statin w/o difficulty.  Check labs.  Adjust meds prn  

## 2015-12-07 NOTE — Assessment & Plan Note (Signed)
Recurrent problem for pt.  Pt w/ current wheezing, coarse BS and wet cough.  Start abx.  Refill albuterol solution.  Reviewed supportive care and red flags that should prompt return.  Pt expressed understanding and is in agreement w/ plan.

## 2015-12-07 NOTE — Assessment & Plan Note (Signed)
Chronic problem.  Check labs.  Replete prn. 

## 2015-12-07 NOTE — Assessment & Plan Note (Signed)
New.  Daughter reports pt is having frequent falls and sustaining injury from said falls.  Based on this, it would be appropriate for pt to have home health PT and safety evaluation.  She is not able to drive to her appts and does not want to 'bother' her family.  Will complete paperwork for home health referral as this is important if pt wants to continue to live independently.

## 2015-12-07 NOTE — Assessment & Plan Note (Signed)
Pt's PE unchanged from previous w/ exception of current COPD exacerbation and leg wound.  Pt is declining mammo, DEXA, colonoscopy.  UTD on immunizations.  Check labs.  Anticipatory guidance provided.

## 2015-12-08 ENCOUNTER — Ambulatory Visit (INDEPENDENT_AMBULATORY_CARE_PROVIDER_SITE_OTHER): Payer: Medicare PPO | Admitting: *Deleted

## 2015-12-08 DIAGNOSIS — I059 Rheumatic mitral valve disease, unspecified: Secondary | ICD-10-CM

## 2015-12-08 DIAGNOSIS — Z9889 Other specified postprocedural states: Secondary | ICD-10-CM

## 2015-12-08 DIAGNOSIS — Z5181 Encounter for therapeutic drug level monitoring: Secondary | ICD-10-CM

## 2015-12-08 DIAGNOSIS — I4891 Unspecified atrial fibrillation: Secondary | ICD-10-CM | POA: Diagnosis not present

## 2015-12-08 LAB — POCT INR: INR: 4.9

## 2015-12-09 NOTE — Telephone Encounter (Signed)
Received fax from [facility] with Denial on [employer], stating " but Approved coverage under you Medical benefit [facility] Part B]; verified with pharmacy that this had been done and patient has received prescription/SLS 01/24

## 2015-12-15 ENCOUNTER — Telehealth: Payer: Self-pay | Admitting: *Deleted

## 2015-12-15 NOTE — Telephone Encounter (Signed)
Received fax with Home Health Certification and Plan of Care, attached billing sheet; forwarded to provider/SLS 01/30

## 2015-12-17 NOTE — Telephone Encounter (Signed)
Forms faxed, sent to scan/SLS 01/31 

## 2015-12-18 ENCOUNTER — Ambulatory Visit (INDEPENDENT_AMBULATORY_CARE_PROVIDER_SITE_OTHER): Payer: Medicare PPO | Admitting: *Deleted

## 2015-12-18 ENCOUNTER — Other Ambulatory Visit: Payer: Medicare PPO | Admitting: *Deleted

## 2015-12-18 DIAGNOSIS — Z5181 Encounter for therapeutic drug level monitoring: Secondary | ICD-10-CM

## 2015-12-18 DIAGNOSIS — I059 Rheumatic mitral valve disease, unspecified: Secondary | ICD-10-CM

## 2015-12-18 DIAGNOSIS — E559 Vitamin D deficiency, unspecified: Secondary | ICD-10-CM

## 2015-12-18 DIAGNOSIS — I4891 Unspecified atrial fibrillation: Secondary | ICD-10-CM | POA: Diagnosis not present

## 2015-12-18 DIAGNOSIS — Z9889 Other specified postprocedural states: Secondary | ICD-10-CM

## 2015-12-18 LAB — POCT INR: INR: 2.4

## 2015-12-18 NOTE — Addendum Note (Signed)
Addended by: Tonita Phoenix on: 12/18/2015 01:25 PM   Modules accepted: Orders

## 2015-12-18 NOTE — Addendum Note (Signed)
Addended by: Tonita Phoenix on: 12/18/2015 01:26 PM   Modules accepted: Orders

## 2015-12-19 ENCOUNTER — Telehealth: Payer: Self-pay | Admitting: Family Medicine

## 2015-12-19 ENCOUNTER — Encounter: Payer: Self-pay | Admitting: General Practice

## 2015-12-19 LAB — VITAMIN D 25 HYDROXY (VIT D DEFICIENCY, FRACTURES): Vit D, 25-Hydroxy: 52 ng/mL (ref 30–100)

## 2015-12-19 NOTE — Telephone Encounter (Signed)
Caller name: Mr. Edwina Barth  Relationship to patient: Alvis Lemmings OT  Can be reached: [phone]  Reason for call: OT evaluated on 2/1. Plan of care 1 x week for 1 week, then 2 x week  for 1 week, then 1 x week for 1 week. For Pain Control, IADR, ADR, Transfers, Exercise, and Mobility. OK to discharge once goals met or max potential

## 2015-12-19 NOTE — Telephone Encounter (Signed)
Ok to proceed w/ plan

## 2015-12-19 NOTE — Telephone Encounter (Signed)
Called [facility]es advised ok by Dr. Beverely Low to go ahead with PT plan

## 2015-12-23 ENCOUNTER — Telehealth: Payer: Self-pay | Admitting: *Deleted

## 2015-12-23 NOTE — Telephone Encounter (Signed)
Received Physician VO; forwarded to provider/SLS 02/07

## 2015-12-23 NOTE — Telephone Encounter (Signed)
Faxed, sent to scan/SLS 02/07

## 2015-12-25 ENCOUNTER — Encounter: Payer: Self-pay | Admitting: Family Medicine

## 2015-12-25 ENCOUNTER — Ambulatory Visit (INDEPENDENT_AMBULATORY_CARE_PROVIDER_SITE_OTHER): Payer: Medicare PPO | Admitting: Family Medicine

## 2015-12-25 VITALS — BP 130/80 | HR 91 | Temp 98.7°F | Ht 63.0 in | Wt 150.2 lb

## 2015-12-25 DIAGNOSIS — I10 Essential (primary) hypertension: Secondary | ICD-10-CM

## 2015-12-25 DIAGNOSIS — R296 Repeated falls: Secondary | ICD-10-CM

## 2015-12-25 DIAGNOSIS — S81802D Unspecified open wound, left lower leg, subsequent encounter: Secondary | ICD-10-CM | POA: Diagnosis not present

## 2015-12-25 MED ORDER — OXYCODONE-ACETAMINOPHEN 5-325 MG PO TABS: 1.0000 | ORAL_TABLET | Freq: Three times a day (TID) | ORAL | Status: DC | PRN

## 2015-12-25 NOTE — Assessment & Plan Note (Signed)
Much better.  Healing well.  No evidence of infxn.  Pt able to cancel wound care appt.  She is pleased with this information.

## 2015-12-25 NOTE — Patient Instructions (Signed)
Follow up for diabetes the end of April You can cancel your wound appt No medication changes at this time Keep up the good work! Call with any questions or concerns Happy Valentine's Day!!!

## 2015-12-25 NOTE — Assessment & Plan Note (Signed)
Much improved since starting PT/OT.  She has not had a fall since last visit.  Pt feels this has been very helpful.  Will follow.

## 2015-12-25 NOTE — Progress Notes (Signed)
   Subjective:    Patient ID: YOLANI VO, female    DOB: 03-04-1930, 82 y.o.   MRN: [id number]  HPI HTN- BP is better today.  Pt reports feeling better.  Denies CP, SOB above baseline, HAs.  Falls- pt reports PT has helped 'so much'.  Pt is using walker rather than cane for stability.  Pt has not had a fall since last visit.  L lower leg wound- pt doesn't feel she needs to go to Wound center, has been treating at home.   Review of Systems For ROS see HPI     Objective:   Physical Exam  Constitutional: She is oriented to person, place, and time. She appears well-developed and well-nourished. No distress.  HENT:  Head: Normocephalic and atraumatic.  Cardiovascular: Normal rate and regular rhythm.   Pulmonary/Chest: Breath sounds normal. No respiratory distress. She has no wheezes. She has no rales.  Neurological: She is alert and oriented to person, place, and time.  Skin: Skin is warm and dry.  L lower leg wound is healing well w/o evidence of infxn  Psychiatric: She has a normal mood and affect. Her behavior is normal. Thought content normal.  Much better mood today  Vitals reviewed.         Assessment & Plan:

## 2015-12-25 NOTE — Assessment & Plan Note (Signed)
BP is better controlled today.  Asymptomatic.  No changes at this time.  Will continue to follow.

## 2015-12-25 NOTE — Progress Notes (Signed)
Pre visit review using our clinic review tool, if applicable. No additional management support is needed unless otherwise documented below in the visit note. 

## 2015-12-26 ENCOUNTER — Other Ambulatory Visit: Payer: Self-pay | Admitting: Family Medicine

## 2015-12-26 NOTE — Telephone Encounter (Signed)
Medication filled to pharmacy as requested.   

## 2015-12-31 ENCOUNTER — Other Ambulatory Visit: Payer: Self-pay | Admitting: Family Medicine

## 2015-12-31 NOTE — Telephone Encounter (Signed)
Medication filled to pharmacy as requested.   

## 2016-01-01 ENCOUNTER — Ambulatory Visit (HOSPITAL_BASED_OUTPATIENT_CLINIC_OR_DEPARTMENT_OTHER): Payer: Medicare PPO

## 2016-01-01 ENCOUNTER — Ambulatory Visit (INDEPENDENT_AMBULATORY_CARE_PROVIDER_SITE_OTHER): Payer: Medicare PPO | Admitting: *Deleted

## 2016-01-01 DIAGNOSIS — I4891 Unspecified atrial fibrillation: Secondary | ICD-10-CM

## 2016-01-01 DIAGNOSIS — Z9889 Other specified postprocedural states: Secondary | ICD-10-CM | POA: Diagnosis not present

## 2016-01-01 DIAGNOSIS — I059 Rheumatic mitral valve disease, unspecified: Secondary | ICD-10-CM | POA: Diagnosis not present

## 2016-01-01 DIAGNOSIS — Z5181 Encounter for therapeutic drug level monitoring: Secondary | ICD-10-CM

## 2016-01-01 LAB — POCT INR: INR: 2.2

## 2016-01-01 NOTE — Telephone Encounter (Signed)
Medication filled to pharmacy as requested.   

## 2016-01-15 ENCOUNTER — Ambulatory Visit (INDEPENDENT_AMBULATORY_CARE_PROVIDER_SITE_OTHER): Payer: Medicare PPO | Admitting: Pharmacist

## 2016-01-15 DIAGNOSIS — I059 Rheumatic mitral valve disease, unspecified: Secondary | ICD-10-CM | POA: Diagnosis not present

## 2016-01-15 DIAGNOSIS — Z9889 Other specified postprocedural states: Secondary | ICD-10-CM

## 2016-01-15 DIAGNOSIS — I4891 Unspecified atrial fibrillation: Secondary | ICD-10-CM

## 2016-01-15 DIAGNOSIS — Z5181 Encounter for therapeutic drug level monitoring: Secondary | ICD-10-CM

## 2016-01-15 LAB — POCT INR: INR: 4.5

## 2016-01-23 ENCOUNTER — Ambulatory Visit (INDEPENDENT_AMBULATORY_CARE_PROVIDER_SITE_OTHER): Payer: Medicare PPO | Admitting: *Deleted

## 2016-01-23 DIAGNOSIS — Z5181 Encounter for therapeutic drug level monitoring: Secondary | ICD-10-CM | POA: Diagnosis not present

## 2016-01-23 DIAGNOSIS — I059 Rheumatic mitral valve disease, unspecified: Secondary | ICD-10-CM

## 2016-01-23 DIAGNOSIS — Z9889 Other specified postprocedural states: Secondary | ICD-10-CM | POA: Diagnosis not present

## 2016-01-23 DIAGNOSIS — I4891 Unspecified atrial fibrillation: Secondary | ICD-10-CM

## 2016-01-23 LAB — POCT INR: INR: 4.1

## 2016-01-26 ENCOUNTER — Other Ambulatory Visit: Payer: Self-pay | Admitting: Family Medicine

## 2016-01-27 NOTE — Telephone Encounter (Signed)
Medication filled to pharmacy as requested.   

## 2016-02-02 ENCOUNTER — Ambulatory Visit (INDEPENDENT_AMBULATORY_CARE_PROVIDER_SITE_OTHER): Payer: Medicare PPO | Admitting: *Deleted

## 2016-02-02 ENCOUNTER — Other Ambulatory Visit: Payer: Self-pay | Admitting: Family Medicine

## 2016-02-02 DIAGNOSIS — I059 Rheumatic mitral valve disease, unspecified: Secondary | ICD-10-CM | POA: Diagnosis not present

## 2016-02-02 DIAGNOSIS — I4891 Unspecified atrial fibrillation: Secondary | ICD-10-CM

## 2016-02-02 DIAGNOSIS — Z5181 Encounter for therapeutic drug level monitoring: Secondary | ICD-10-CM

## 2016-02-02 DIAGNOSIS — Z9889 Other specified postprocedural states: Secondary | ICD-10-CM

## 2016-02-02 LAB — POCT INR: INR: 2.9

## 2016-02-02 NOTE — Telephone Encounter (Signed)
Medication filled to pharmacy as requested.   

## 2016-02-16 ENCOUNTER — Ambulatory Visit (INDEPENDENT_AMBULATORY_CARE_PROVIDER_SITE_OTHER): Payer: Medicare PPO | Admitting: *Deleted

## 2016-02-16 DIAGNOSIS — I4891 Unspecified atrial fibrillation: Secondary | ICD-10-CM | POA: Diagnosis not present

## 2016-02-16 DIAGNOSIS — I059 Rheumatic mitral valve disease, unspecified: Secondary | ICD-10-CM | POA: Diagnosis not present

## 2016-02-16 DIAGNOSIS — Z9889 Other specified postprocedural states: Secondary | ICD-10-CM | POA: Diagnosis not present

## 2016-02-16 DIAGNOSIS — Z5181 Encounter for therapeutic drug level monitoring: Secondary | ICD-10-CM | POA: Diagnosis not present

## 2016-02-16 LAB — POCT INR: INR: 3.1

## 2016-02-17 ENCOUNTER — Other Ambulatory Visit: Payer: Self-pay | Admitting: Cardiovascular Disease

## 2016-02-17 DIAGNOSIS — I6523 Occlusion and stenosis of bilateral carotid arteries: Secondary | ICD-10-CM

## 2016-02-20 ENCOUNTER — Other Ambulatory Visit: Payer: Self-pay | Admitting: Family Medicine

## 2016-02-24 ENCOUNTER — Ambulatory Visit (HOSPITAL_COMMUNITY)
Admission: RE | Admit: 2016-02-24 | Discharge: 2016-02-24 | Disposition: A | Discharge status: Home / Self Care | Payer: Medicare PPO | Source: Ambulatory Visit | Attending: Cardiology | Admitting: Cardiology

## 2016-02-24 DIAGNOSIS — I130 Hypertensive heart and chronic kidney disease with heart failure and stage 1 through stage 4 chronic kidney disease, or unspecified chronic kidney disease: Secondary | ICD-10-CM | POA: Insufficient documentation

## 2016-02-24 DIAGNOSIS — E7800 Pure hypercholesterolemia, unspecified: Secondary | ICD-10-CM | POA: Insufficient documentation

## 2016-02-24 DIAGNOSIS — N189 Chronic kidney disease, unspecified: Secondary | ICD-10-CM | POA: Diagnosis not present

## 2016-02-24 DIAGNOSIS — I6523 Occlusion and stenosis of bilateral carotid arteries: Secondary | ICD-10-CM | POA: Insufficient documentation

## 2016-02-24 DIAGNOSIS — E1122 Type 2 diabetes mellitus with diabetic chronic kidney disease: Secondary | ICD-10-CM | POA: Insufficient documentation

## 2016-02-24 DIAGNOSIS — I5032 Chronic diastolic (congestive) heart failure: Secondary | ICD-10-CM | POA: Insufficient documentation

## 2016-02-26 ENCOUNTER — Other Ambulatory Visit: Payer: Self-pay | Admitting: Family Medicine

## 2016-02-26 ENCOUNTER — Other Ambulatory Visit: Payer: Self-pay | Admitting: Cardiovascular Disease

## 2016-03-08 ENCOUNTER — Ambulatory Visit (INDEPENDENT_AMBULATORY_CARE_PROVIDER_SITE_OTHER): Payer: Medicare PPO | Admitting: *Deleted

## 2016-03-08 DIAGNOSIS — I059 Rheumatic mitral valve disease, unspecified: Secondary | ICD-10-CM

## 2016-03-08 DIAGNOSIS — Z9889 Other specified postprocedural states: Secondary | ICD-10-CM | POA: Diagnosis not present

## 2016-03-08 DIAGNOSIS — I4891 Unspecified atrial fibrillation: Secondary | ICD-10-CM | POA: Diagnosis not present

## 2016-03-08 DIAGNOSIS — Z5181 Encounter for therapeutic drug level monitoring: Secondary | ICD-10-CM | POA: Diagnosis not present

## 2016-03-08 LAB — POCT INR: INR: 2.5

## 2016-03-11 ENCOUNTER — Ambulatory Visit (INDEPENDENT_AMBULATORY_CARE_PROVIDER_SITE_OTHER): Payer: Medicare PPO | Admitting: Family Medicine

## 2016-03-11 ENCOUNTER — Encounter: Payer: Self-pay | Admitting: Family Medicine

## 2016-03-11 VITALS — BP 124/84 | HR 80 | Temp 98.0°F | Resp 16 | Ht 63.0 in | Wt 162.1 lb

## 2016-03-11 DIAGNOSIS — E1151 Type 2 diabetes mellitus with diabetic peripheral angiopathy without gangrene: Secondary | ICD-10-CM

## 2016-03-11 LAB — BASIC METABOLIC PANEL
BUN: 44 mg/dL — ABNORMAL HIGH (ref 6–23)
CALCIUM: 9.5 mg/dL (ref 8.4–10.5)
CO2: 27 meq/L (ref 19–32)
Chloride: 103 mEq/L (ref 96–112)
Creatinine, Ser: 1.74 mg/dL — ABNORMAL HIGH (ref 0.40–1.20)
GFR: 29.67 mL/min — AB (ref >60.00)
Glucose, Bld: 106 mg/dL — ABNORMAL HIGH (ref 70–99)
Potassium: 4 mEq/L (ref 3.5–5.1)
SODIUM: 142 meq/L (ref 135–145)

## 2016-03-11 LAB — HEMOGLOBIN A1C: Hgb A1c MFr Bld: 6.1 % (ref 4.6–6.5)

## 2016-03-11 NOTE — Patient Instructions (Signed)
Follow up in 3-4 months to recheck diabetes and cholesterol We'll notify you of your lab results and make any changes if needed Call and schedule your eye exam Keep up the good work on healthy diet Call with any questions or concerns Thanks for sticking with us! Happy Spring!!!

## 2016-03-11 NOTE — Progress Notes (Signed)
Pre visit review using our clinic review tool, if applicable. No additional management support is needed unless otherwise documented below in the visit note. 

## 2016-03-11 NOTE — Assessment & Plan Note (Signed)
Chronic problem.  Home CBGs are well controlled.  Due for eye exam next month.  UTD on microalbumin and foot exam.  Intolerant to ACE.  Check labs.  Adjust meds prn

## 2016-03-11 NOTE — Progress Notes (Signed)
   Subjective:    Patient ID: Kelsey Cochran, female    DOB: 01/07/1930, 82 y.o.   MRN: [id number]  HPI DM- chronic problem, on Glipizide daily.  UTD on foot exam, eye exam (due next month), microalbumin.  Pt reports home CBGs have 'been a little higher' w/ strawberry season.  AM CBGs 110s-140s.  Denies symptomatic lows.  No CP, SOB above baseline, HAs, visual changes, edema, abd pain.  + intermittent nausea, no vomiting or diarrhea.   Review of Systems For ROS see HPI     Objective:   Physical Exam  Constitutional: She is oriented to person, place, and time. She appears well-developed and well-nourished. No distress.  HENT:  Head: Normocephalic and atraumatic.  Eyes: Conjunctivae and EOM are normal. Pupils are equal, round, and reactive to light.  Neck: Normal range of motion. Neck supple. No thyromegaly present.  Cardiovascular: Normal rate, regular rhythm and intact distal pulses.   No murmur heard. Mitral click  Pulmonary/Chest: Effort normal and breath sounds normal. No respiratory distress.  Abdominal: Soft. She exhibits no distension. There is no tenderness.  Musculoskeletal: She exhibits no edema.  Lymphadenopathy:    She has no cervical adenopathy.  Neurological: She is alert and oriented to person, place, and time.  Skin: Skin is warm and dry.  Psychiatric: She has a normal mood and affect. Her behavior is normal.  Vitals reviewed.         Assessment & Plan:

## 2016-03-12 ENCOUNTER — Encounter: Payer: Self-pay | Admitting: General Practice

## 2016-03-26 ENCOUNTER — Ambulatory Visit (INDEPENDENT_AMBULATORY_CARE_PROVIDER_SITE_OTHER): Payer: Medicare PPO | Admitting: *Deleted

## 2016-03-26 ENCOUNTER — Encounter: Payer: Self-pay | Admitting: Cardiovascular Disease

## 2016-03-26 ENCOUNTER — Ambulatory Visit (INDEPENDENT_AMBULATORY_CARE_PROVIDER_SITE_OTHER): Payer: Medicare PPO | Admitting: Cardiovascular Disease

## 2016-03-26 VITALS — BP 120/70 | HR 77 | Ht 63.5 in | Wt 167.1 lb

## 2016-03-26 DIAGNOSIS — Z5181 Encounter for therapeutic drug level monitoring: Secondary | ICD-10-CM

## 2016-03-26 DIAGNOSIS — Z9889 Other specified postprocedural states: Secondary | ICD-10-CM | POA: Diagnosis not present

## 2016-03-26 DIAGNOSIS — I4891 Unspecified atrial fibrillation: Secondary | ICD-10-CM

## 2016-03-26 DIAGNOSIS — I059 Rheumatic mitral valve disease, unspecified: Secondary | ICD-10-CM

## 2016-03-26 LAB — POCT INR: INR: 4.8

## 2016-03-26 MED ORDER — NITROGLYCERIN 0.4 MG SL SUBL: 0.4000 mg | SUBLINGUAL_TABLET | SUBLINGUAL | Status: AC | PRN

## 2016-03-26 NOTE — Patient Instructions (Addendum)
Medication Instructions:  Start using Nitroglycerin for Chest discomfort as needed-all other medications remain the same.  Labwork: None  Testing/Procedures: None  Follow-Up: Your physician recommends that you schedule a follow-up appointment in: 6 months     If you need a refill on your cardiac medications before your next appointment, please call your pharmacy.

## 2016-03-26 NOTE — Addendum Note (Signed)
Addended by: Demetrios LollVIA, PATRICIA M on: 03/26/2016 02:48 PM   Modules accepted: Orders

## 2016-03-26 NOTE — Progress Notes (Signed)
Patient ID: Kelsey Cochran, female   DOB: August 18, 1930, 82 y.o.   MRN: [id number]   Sommer is seen today for F/U of dyspnea, anticoagulaiton and MVR and carotid disease.  . She has significant anemia in past. . I reviewed her echo from 9/15 and EF normal with normal functioning MVR no evidence of perivalvular leak or other abnormality that could contribute to anemia via shearing effect/hemolysis. She apparantly has had Aranasp shots and F/U with [name] but I don't have these notes. Tried on lisinopril for proteinuria but could not tolerate for light headedness. Encouraged her to F/U with [name] to find low dose substitute Recently put on fenofibrate for tryglycerides and INR elevated Following with coumadin clinic to adjust dose and better.   Echo 9/15 with normal appearing MVR but severe pulmonary hypertension  Study Conclusions  - Left ventricle: The cavity size was normal. Systolic function was  normal. The estimated ejection fraction was in the range of 55% to 60%. Wall motion was normal; there were no regional wall motion abnormalities. - Aortic valve: Trileaflet; mildly thickened leaflets. - Mitral valve: A mechanical prosthesis was present and functioning normally. Valve area by continuity equation (using LVOT flow): 1.45 cm^2. - Left atrium: The atrium was mildly dilated. - Tricuspid valve: There was mild-moderate regurgitation. - Pulmonic valve: There was trivial regurgitation. - Pulmonary arteries: PA peak pressure: 85 mm Hg (S).  Impressions:  - The right ventricular systolic pressure was increased consistent with severe pulmonary hypertension.  02/24/16 60-79% RICA stenosis.  F/U carotid duplex in 6 months  08/20/14  Myovue was normal   She was started on Buspar with some lightening of her mood.  Discussed pro's/cons of right heart cath and she prefers not to have it again She is on beta blocker to maximize diastolic filling period, diuretic to lower filling  pressures  And imdur as vasodilator as she cannot afford Any of the specific pulmonary vasodilators  With MVR and previous TIA she needs lovenox bridging   [name] has told her to increase fluid intake   Lab Results  Component Value Date   CREATININE 1.74* 03/11/2016   Lab Results  Component Value Date   HCT 37.3 12/04/2015   Had an isolated episode of SSCP lasting seconds Does not really want to continue carotid surveillance   ROS: Denies fever, malais, weight loss, blurry vision, decreased visual acuity, cough, sputum, SOB, hemoptysis, pleuritic pain, palpitaitons, heartburn, abdominal pain, melena, lower extremity edema, claudication, or rash.  All other systems reviewed and negative  General: Still some depressed  Healthy:  appears stated age HEENT: normal Neck supple with no adenopathy JVP normal no bruits no thyromegaly Lungs clear with no wheezing and good diaphragmatic motion Heart:  S1 click /S2 no murmur, no rub, gallop or click PMI normal Abdomen: benighn, BS positve, no tenderness, no AAA no bruit.  No HSM or HJR Distal pulses intact with no bruits No edema Neuro non-focal Skin warm and dry No muscular weakness   Current Outpatient Prescriptions  Medication Sig Dispense Refill  . albuterol (PROVENTIL) (2.5 MG/3ML) 0.083% nebulizer solution Take 3 mLs (2.5 mg total) by nebulization every 6 (six) hours as needed for wheezing or shortness of breath. 75 mL 12  . alendronate (FOSAMAX) 35 MG tablet TAKE 1 TABLET EVERY 7 DAYS IN THE MORNING AS DIRECTED. SEE PACKAGE FOR ADDITIONAL INSTRUCTIONS 12 tablet 0  . aspirin 81 MG tablet Take 81 mg by mouth daily.      Marland Kitchen  atorvastatin (LIPITOR) 20 MG tablet Take 20 mg by mouth daily.    . busPIRone (BUSPAR) 7.5 MG tablet TAKE 1 TABLET BY MOUTH TWICE DAILY 60 tablet 6  . carvedilol (COREG) 25 MG tablet Take 0.5 tablets (12.5 mg total) by mouth daily. 45 tablet 3  . Cholecalciferol (VITAMIN D3) 5000 UNITS CAPS Take 1 capsule  by mouth daily. Reported on 12/25/2015    . Diphenhydramine-APAP, sleep, (TYLENOL PM EXTRA STRENGTH PO) Take 1 tablet by mouth daily as needed (for sleep). As needed    . docusate sodium (COLACE) 100 MG capsule Take 100 mg by mouth 2 (two) times daily.      . fenofibrate 160 MG tablet Take 160 mg by mouth daily.    . folic acid (FOLVITE) 800 MCG tablet Take 400 mcg by mouth daily.      . furosemide (LASIX) 80 MG tablet Take 1 tablet (80 mg total) by mouth 2 (two) times daily. 180 tablet 3  . glipiZIDE (GLUCOTROL XL) 2.5 MG 24 hr tablet TAKE 1 TABLET BY MOUTH DAILY 30 tablet 5  . HYDROcodone-acetaminophen (NORCO/VICODIN) 5-325 MG tablet Take 1 tablet by mouth every 6 (six) hours as needed for moderate pain.    . isosorbide mononitrate (IMDUR) 30 MG 24 hr tablet Take 30 mg by mouth daily.    . methocarbamol (ROBAXIN) 500 MG tablet Take 500 mg by mouth 2 (two) times daily as needed for muscle spasms.    Marland Kitchen oxyCODONE-acetaminophen (ROXICET) 5-325 MG tablet Take 1 tablet by mouth every 8 (eight) hours as needed for severe pain. 45 tablet 0  . sertraline (ZOLOFT) 50 MG tablet TAKE 1 TABLET(50 MG) BY MOUTH DAILY 30 tablet 3  . spironolactone (ALDACTONE) 25 MG tablet Take 25 mg by mouth daily.    Marland Kitchen triamcinolone cream (KENALOG) 0.1 % Apply 1 application topically 2 (two) times daily. 30 g 0  . warfarin (COUMADIN) 2.5 MG tablet TAKE AS DIRECTED 35 tablet 3  . [DISCONTINUED] glyBURIDE (DIABETA) 5 MG tablet Take 1 tablet (5 mg total) by mouth 2 (two) times daily. 180 tablet 0   No current facility-administered medications for this visit.    Allergies  Meperidine hcl; Sulfamethoxazole-trimethoprim; and Heparin  Electrocardiogram:  afib rate 76  Poor R wave progression RAD  09/17/15  afib rate 65 RVH no change   Assessment and Plan MVR:  Normal exam and echo continue beta blocker and anticoagulation Depression:  Significant on buspar and celexa CHF:  Diastolic continue beta blockers and diuretics CR has  been elevated but would continue high dose bid lasix As she will have more symptomatic edema and dyspnea without it  Anticoagulation: no bleeding issues f/u coumadin clinic  lovenox bridging recommended  Chol:  On statin   Cholesterol is at goal.  Continue current dose of statin and diet Rx.  No myalgias or side effects.  F/U  LFT's in 6 months. 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  Will discuss yearly [place] at time since she does not want to continue to follow Chest Pain: isolated self limited  episode SL nitro called in observe    F/U with me in 6 months   Charlton HawsPeter Nishan

## 2016-04-05 ENCOUNTER — Ambulatory Visit (INDEPENDENT_AMBULATORY_CARE_PROVIDER_SITE_OTHER): Payer: Medicare PPO | Admitting: *Deleted

## 2016-04-05 DIAGNOSIS — Z5181 Encounter for therapeutic drug level monitoring: Secondary | ICD-10-CM | POA: Diagnosis not present

## 2016-04-05 DIAGNOSIS — I4891 Unspecified atrial fibrillation: Secondary | ICD-10-CM | POA: Diagnosis not present

## 2016-04-05 DIAGNOSIS — Z9889 Other specified postprocedural states: Secondary | ICD-10-CM | POA: Diagnosis not present

## 2016-04-05 DIAGNOSIS — I059 Rheumatic mitral valve disease, unspecified: Secondary | ICD-10-CM | POA: Diagnosis not present

## 2016-04-05 LAB — POCT INR: INR: 2.8

## 2016-04-13 ENCOUNTER — Telehealth: Payer: Self-pay | Admitting: Family Medicine

## 2016-04-13 NOTE — Telephone Encounter (Signed)
Daughter calling about pt having server back pain and asking if there is a way that we could get her an appt sooner with an ortho than she could. [facility].

## 2016-04-13 NOTE — Telephone Encounter (Signed)
The only thing that could get her in sooner is if you call. We cannot guarantee anything. See if we place a referral if she could get in sooner.

## 2016-04-14 NOTE — Telephone Encounter (Signed)
Called [facility], they state that they cannot schedule any sooner with me than they could with the pt. I LM for pt to call and schedule with them so there would not be any conflict with scheduling.

## 2016-04-21 ENCOUNTER — Other Ambulatory Visit: Payer: Self-pay | Admitting: Family Medicine

## 2016-04-21 NOTE — Telephone Encounter (Signed)
Medication filled to pharmacy as requested.   

## 2016-04-25 ENCOUNTER — Other Ambulatory Visit: Payer: Self-pay | Admitting: Family Medicine

## 2016-04-26 ENCOUNTER — Ambulatory Visit (INDEPENDENT_AMBULATORY_CARE_PROVIDER_SITE_OTHER): Payer: Medicare PPO

## 2016-04-26 DIAGNOSIS — I059 Rheumatic mitral valve disease, unspecified: Secondary | ICD-10-CM | POA: Diagnosis not present

## 2016-04-26 DIAGNOSIS — Z5181 Encounter for therapeutic drug level monitoring: Secondary | ICD-10-CM | POA: Diagnosis not present

## 2016-04-26 DIAGNOSIS — Z9889 Other specified postprocedural states: Secondary | ICD-10-CM

## 2016-04-26 DIAGNOSIS — I4891 Unspecified atrial fibrillation: Secondary | ICD-10-CM | POA: Diagnosis not present

## 2016-04-26 LAB — POCT INR: INR: 3.7

## 2016-04-26 NOTE — Telephone Encounter (Signed)
Medication filled to pharmacy as requested.   

## 2016-04-27 ENCOUNTER — Telehealth: Payer: Self-pay

## 2016-04-27 DIAGNOSIS — Z79899 Other long term (current) drug therapy: Secondary | ICD-10-CM

## 2016-04-27 LAB — BASIC METABOLIC PANEL
BUN: 40 mg/dL — AB (ref 7–25)
CALCIUM: 8.7 mg/dL (ref 8.6–10.4)
CO2: 28 mmol/L (ref 20–31)
CREATININE: 1.56 mg/dL — AB (ref 0.60–0.88)
Chloride: 103 mmol/L (ref 98–110)
GLUCOSE: 111 mg/dL — AB (ref 65–99)
Potassium: 4.1 mmol/L (ref 3.5–5.3)
Sodium: 143 mmol/L (ref 135–146)

## 2016-04-27 MED ORDER — FUROSEMIDE 80 MG PO TABS: 80.0000 mg | ORAL_TABLET | Freq: Every day | ORAL | Status: DC

## 2016-04-27 MED ORDER — ENOXAPARIN SODIUM 80 MG/0.8ML ~~LOC~~ SOLN: 80.0000 mg | SUBCUTANEOUS | Status: DC

## 2016-04-27 NOTE — Telephone Encounter (Signed)
Patient aware of lab results. Patient is waiting to hear from coumadin clinic will route message to them.

## 2016-04-27 NOTE — Telephone Encounter (Signed)
See Anticoagulation note from 04/26/16

## 2016-04-27 NOTE — Patient Instructions (Signed)
05/01/16-  Last dose of Coumadin  05/02/16- No coumadin or Lovenox   05/03/16- : Inject Lovenox 80mg s at 9pm in the fatty abdominal tissue at least 2 inches from the belly button once every 24 hours.  No Coumadin  05/04/16-  Inject Lovenox 80mg s at 9pm in the fatty abdominal tissue at least 2 inches from the belly button once every 24 hours.  No Coumadin  05/05/16- Inject Lovenox 80mg s at 9pm in the fatty abdominal tissue at least 2 inches from the belly button once every 24 hours.  No Coumadin  05/06/16- No Lovenox or Coumadin   05/07/16- Procedure Day - No Lovenox - Resume Coumadin in the evening or as directed by doctor with your normal dosage  05/08/16- Resume Lovenox 80mg s  Injection  in the fatty abdominal tissue at 9am,   and take coumadin dosage as normally scheduled.   05/09/16- Continue Lovenox 80mg s injection once a day at 9am in the fatty abdominal tissue at least 2 inches from the belly button. Take Normal Coumadin dosage.   05/10/16- Continue Lovenox 80mg s injection once a day at 9am in the fatty abdominal tissue at least 2 inches from the belly button. Take Normal Coumadin dosage.   05/11/16- Continue Lovenox 80mg s injection once a day at 9am in the fatty abdominal tissue at least 2 inches from the belly button. Take Normal Coumadin dosage.   05/12/16- Continue Lovenox 80mg s injection once a day at 9am in the fatty abdominal tissue at least 2 inches from the belly button.  Come in for INR check.

## 2016-04-27 NOTE — Telephone Encounter (Signed)
-----   Message from Wendall StadePeter C Nishan, MD sent at 04/27/2016 11:47 AM EDT ----- Bun/Cr up cut lasix back to 80 once/day f/u BMET in 3 weeks

## 2016-04-27 NOTE — Telephone Encounter (Signed)
[facility] that pt is scheduled for a spinal injection on 05/07/16. Request to hold Coumadin x5 days. Pt has mechanical valve and afib with hx of TIA, DM, HTN, and age > 575. Pt ok to hold 5 days but needs Lovenox bridge. Pt has used Lovenox before. [facility] [phone].  INR from 6/12 addressed, see anticoag note. BMET checked too, SCr improved to 1.56. Actual body weight > 30% ideal body weight, need to calculate CrCl using adjusted body weight.  Actual = 75.9kg Ideal = 52.4kg Adjusted = 61.8kg  CrCl using adjusted body weight = 26.66.  Will dose Lovenox at 1mg /kg daily (using actual body weight) since CrCl < 30. Lovenox dose = 80mg  Henning daily.

## 2016-04-28 ENCOUNTER — Telehealth: Payer: Self-pay | Admitting: Family Medicine

## 2016-04-28 MED ORDER — OXYCODONE-ACETAMINOPHEN 5-325 MG PO TABS: 1.0000 | ORAL_TABLET | Freq: Three times a day (TID) | ORAL | Status: DC | PRN

## 2016-04-28 NOTE — Telephone Encounter (Signed)
Last OV 03/11/16 Oxycodone last filled 12/25/15 #45 with 0  Pleas advise pt also has hydrocodone on her med list, want me to remove this?

## 2016-04-28 NOTE — Telephone Encounter (Signed)
Med filled and pt advised available at the front desk for pick .

## 2016-04-28 NOTE — Telephone Encounter (Signed)
Ok to refill oxycodone.  Please remove Percocet from med list

## 2016-04-28 NOTE — Telephone Encounter (Signed)
Patient states she does not have any refills of percocet remaining.  She is requesting refill.

## 2016-05-12 ENCOUNTER — Ambulatory Visit (INDEPENDENT_AMBULATORY_CARE_PROVIDER_SITE_OTHER): Payer: Medicare PPO | Admitting: *Deleted

## 2016-05-12 DIAGNOSIS — I059 Rheumatic mitral valve disease, unspecified: Secondary | ICD-10-CM

## 2016-05-12 DIAGNOSIS — I4891 Unspecified atrial fibrillation: Secondary | ICD-10-CM | POA: Diagnosis not present

## 2016-05-12 DIAGNOSIS — Z5181 Encounter for therapeutic drug level monitoring: Secondary | ICD-10-CM | POA: Diagnosis not present

## 2016-05-12 DIAGNOSIS — Z9889 Other specified postprocedural states: Secondary | ICD-10-CM | POA: Diagnosis not present

## 2016-05-12 LAB — POCT INR: INR: 2.1

## 2016-05-19 ENCOUNTER — Other Ambulatory Visit (INDEPENDENT_AMBULATORY_CARE_PROVIDER_SITE_OTHER): Payer: Medicare PPO | Admitting: *Deleted

## 2016-05-19 ENCOUNTER — Ambulatory Visit (INDEPENDENT_AMBULATORY_CARE_PROVIDER_SITE_OTHER): Payer: Medicare PPO | Admitting: Pharmacist

## 2016-05-19 DIAGNOSIS — Z79899 Other long term (current) drug therapy: Secondary | ICD-10-CM | POA: Diagnosis not present

## 2016-05-19 DIAGNOSIS — I059 Rheumatic mitral valve disease, unspecified: Secondary | ICD-10-CM | POA: Diagnosis not present

## 2016-05-19 DIAGNOSIS — I4891 Unspecified atrial fibrillation: Secondary | ICD-10-CM

## 2016-05-19 DIAGNOSIS — Z5181 Encounter for therapeutic drug level monitoring: Secondary | ICD-10-CM

## 2016-05-19 DIAGNOSIS — Z9889 Other specified postprocedural states: Secondary | ICD-10-CM

## 2016-05-19 LAB — BASIC METABOLIC PANEL
BUN: 41 mg/dL — AB (ref 7–25)
CALCIUM: 9.5 mg/dL (ref 8.6–10.4)
CO2: 23 mmol/L (ref 20–31)
Chloride: 102 mmol/L (ref 98–110)
Creat: 1.71 mg/dL — ABNORMAL HIGH (ref 0.60–0.88)
GLUCOSE: 148 mg/dL — AB (ref 65–99)
POTASSIUM: 5 mmol/L (ref 3.5–5.3)
Sodium: 141 mmol/L (ref 135–146)

## 2016-05-19 LAB — POCT INR: INR: 3.7

## 2016-05-20 ENCOUNTER — Telehealth: Payer: Self-pay

## 2016-05-20 DIAGNOSIS — Z79899 Other long term (current) drug therapy: Secondary | ICD-10-CM

## 2016-05-20 NOTE — Telephone Encounter (Addendum)
Called patient with lab results. Patient will stop aldactone and will repeat lab work on 06/17/16. Encouraged patient to call with any other questions or concerns.   ----- Message from Kelsey StadePeter C Nishan, MD sent at 05/20/2016 10:20 AM EDT ----- K BUN/Cr up stop aldactone and f/u labs in 4 weeks

## 2016-05-28 ENCOUNTER — Telehealth: Payer: Self-pay | Admitting: Family Medicine

## 2016-05-28 MED ORDER — OXYCODONE-ACETAMINOPHEN 5-325 MG PO TABS: 1.0000 | ORAL_TABLET | Freq: Three times a day (TID) | ORAL | Status: AC | PRN

## 2016-05-28 NOTE — Telephone Encounter (Signed)
Pt called and informed.

## 2016-05-28 NOTE — Telephone Encounter (Signed)
Med filled and is available at the front desk for pick up.

## 2016-05-28 NOTE — Telephone Encounter (Signed)
Please advise, pt was last seen on 03/11/16. Called pt to find out more about the pain she is experiencing. No answer Voicemail left to return call

## 2016-05-28 NOTE — Telephone Encounter (Signed)
Pt asking if Dr Beverely Lowabori would write an Rx for Oxycodone, pt states that she is going to the beach with family on 7[place]22 and would like to have this for her trip to help manage pain

## 2016-05-28 NOTE — Telephone Encounter (Signed)
Ok for #45, no refills 

## 2016-06-02 ENCOUNTER — Encounter: Payer: Self-pay | Admitting: Family Medicine

## 2016-06-02 ENCOUNTER — Emergency Department (HOSPITAL_COMMUNITY)
Admission: EM | Admit: 2016-06-02 | Discharge: 2016-06-03 | Disposition: A | Discharge status: Home / Self Care | Payer: Medicare PPO | Attending: Emergency Medicine | Admitting: Emergency Medicine

## 2016-06-02 ENCOUNTER — Encounter (HOSPITAL_COMMUNITY): Payer: Self-pay | Admitting: Emergency Medicine

## 2016-06-02 ENCOUNTER — Ambulatory Visit (INDEPENDENT_AMBULATORY_CARE_PROVIDER_SITE_OTHER): Payer: Medicare PPO | Admitting: Family Medicine

## 2016-06-02 VITALS — BP 130/73 | HR 79 | Temp 97.9°F | Resp 16 | Ht 64.0 in

## 2016-06-02 DIAGNOSIS — N189 Chronic kidney disease, unspecified: Secondary | ICD-10-CM | POA: Insufficient documentation

## 2016-06-02 DIAGNOSIS — Z8673 Personal history of transient ischemic attack (TIA), and cerebral infarction without residual deficits: Secondary | ICD-10-CM | POA: Diagnosis not present

## 2016-06-02 DIAGNOSIS — W228XXA Striking against or struck by other objects, initial encounter: Secondary | ICD-10-CM | POA: Insufficient documentation

## 2016-06-02 DIAGNOSIS — D689 Coagulation defect, unspecified: Secondary | ICD-10-CM

## 2016-06-02 DIAGNOSIS — S81812A Laceration without foreign body, left lower leg, initial encounter: Secondary | ICD-10-CM | POA: Insufficient documentation

## 2016-06-02 DIAGNOSIS — Z7984 Long term (current) use of oral hypoglycemic drugs: Secondary | ICD-10-CM | POA: Insufficient documentation

## 2016-06-02 DIAGNOSIS — Y92099 Unspecified place in other non-institutional residence as the place of occurrence of the external cause: Secondary | ICD-10-CM | POA: Diagnosis not present

## 2016-06-02 DIAGNOSIS — Z7982 Long term (current) use of aspirin: Secondary | ICD-10-CM | POA: Insufficient documentation

## 2016-06-02 DIAGNOSIS — E1122 Type 2 diabetes mellitus with diabetic chronic kidney disease: Secondary | ICD-10-CM | POA: Insufficient documentation

## 2016-06-02 DIAGNOSIS — Z79899 Other long term (current) drug therapy: Secondary | ICD-10-CM | POA: Insufficient documentation

## 2016-06-02 DIAGNOSIS — I130 Hypertensive heart and chronic kidney disease with heart failure and stage 1 through stage 4 chronic kidney disease, or unspecified chronic kidney disease: Secondary | ICD-10-CM | POA: Diagnosis not present

## 2016-06-02 DIAGNOSIS — W19XXXA Unspecified fall, initial encounter: Secondary | ICD-10-CM | POA: Diagnosis not present

## 2016-06-02 DIAGNOSIS — I5032 Chronic diastolic (congestive) heart failure: Secondary | ICD-10-CM | POA: Diagnosis not present

## 2016-06-02 DIAGNOSIS — Z7901 Long term (current) use of anticoagulants: Secondary | ICD-10-CM | POA: Insufficient documentation

## 2016-06-02 DIAGNOSIS — Y92009 Unspecified place in unspecified non-institutional (private) residence as the place of occurrence of the external cause: Principal | ICD-10-CM

## 2016-06-02 DIAGNOSIS — Y929 Unspecified place or not applicable: Secondary | ICD-10-CM | POA: Diagnosis not present

## 2016-06-02 DIAGNOSIS — Y939 Activity, unspecified: Secondary | ICD-10-CM | POA: Insufficient documentation

## 2016-06-02 DIAGNOSIS — T148XXA Other injury of unspecified body region, initial encounter: Secondary | ICD-10-CM

## 2016-06-02 DIAGNOSIS — Y999 Unspecified external cause status: Secondary | ICD-10-CM | POA: Insufficient documentation

## 2016-06-02 NOTE — Patient Instructions (Signed)
Follow up as needed Keep pressure dressing on L leg until tomorrow night.  It's ok to loosen the ACE bandage but do not peel the pad off to avoid risk of rebleeding Keep areas clean and dry- peroxide daily, pat dry, and then apply antibiotic ointments Cover w/ nonstick pads and wrap with gauze We'll call you with your wound care appts and work on home health Please try and work on your strength and endurance by walking at home Call with any questions or concerns Hang in there!!!

## 2016-06-02 NOTE — ED Notes (Signed)
Pt injured left leg on a car door this morning. Pt went to her PCP where the wound was dressed. Family reports changing the bandage 3 times and bleeding will not stop. Pt takes coumadin. Pulses present, sensation intact. Large skin tear to left lower leg.

## 2016-06-02 NOTE — Progress Notes (Signed)
Pre visit review using our clinic review tool, if applicable. No additional management support is needed unless otherwise documented below in the visit note. 

## 2016-06-02 NOTE — Progress Notes (Signed)
   Subjective:    Patient ID: Kelsey Cochran, female    DOB: 08/17/1930, 82 y.o.   MRN: [id number]  HPI Fall- occurred this AM while getting in the car to go to coumadin clinic.  Pt slipped and 'scraped the skin off my legs'.  Pt has chronic back pain but other than some discomfort at site of road rash, denies pain.  Did not hit head.  On coumadin.  Bleeding profusely from upper lower leg wound.  Accompanied by both daughters this AM.   Review of Systems For ROS see HPI     Objective:   Physical Exam  Constitutional: She is oriented to person, place, and time. She appears well-developed and well-nourished. No distress.  Sitting in wheelchair  HENT:  Head: Normocephalic and atraumatic.  Eyes: Conjunctivae and EOM are normal. Pupils are equal, round, and reactive to light.  Cardiovascular: Intact distal pulses.   Musculoskeletal: She exhibits tenderness (TTP over L lower leg hematoma but no TTP over bones or joints). She exhibits no edema.  Neurological: She is alert and oriented to person, place, and time.  Skin: Skin is warm.  Pt has R lower leg skin tear that has scab present- area cleaned w/ H2O2 and redressed in office Pt has 3 L lower leg wounds- most distal is 1.5 cm hematoma, TTP but not open or oozing.  This was cleaned and covered w/ a sterile dressing in office.  Middle wound is collection of scattered skin tears w/ scabbing present.  Area cleaned w/ H2O2 and redressed.  Most proximal wound is ~4 cm skin tear w/ brisk bleeding from medial margin.  Area was dressed w/ sterile pressure dressing in office  Psychiatric: She has a normal mood and affect. Her behavior is normal. Thought content normal.  Vitals reviewed.         Assessment & Plan:  Skin tears/lacerations- due to pt's thin skin, use of coumadin, and age she has repeated lower leg skin tears.  Family is frustrated w/ this ongoing issue.  Today's wound is bleeding briskly and required a sterile pad w/ extra gauze padding  and ACE wrap as a pressure dressing.  Other wounds were cleaned and dressed.  Will refer to wound care for ongoing evaluation and management  Fall- pt fell getting in the car today and her family is worried b/c they have noted a rapid decline since she is no longer active or getting up (using scooter or wheelchair).  Discussed w/ pt and family that if she is not using her muscles, they will atrophy and no longer support her.  Pt agrees but states it is hard for her to move while she is in pain.  [facility] and work with [facility].  Total time spent w/ pt and family to clean and dress wounds as well as discuss home safety and need for PT/wound care 55 minutes, >50% spent counseling.

## 2016-06-03 ENCOUNTER — Emergency Department (HOSPITAL_BASED_OUTPATIENT_CLINIC_OR_DEPARTMENT_OTHER)
Admission: EM | Admit: 2016-06-03 | Discharge: 2016-06-03 | Disposition: A | Discharge status: Home / Self Care | Payer: Medicare PPO | Source: Home / Self Care | Attending: Emergency Medicine | Admitting: Emergency Medicine

## 2016-06-03 ENCOUNTER — Telehealth: Payer: Self-pay | Admitting: Family Medicine

## 2016-06-03 ENCOUNTER — Telehealth: Payer: Self-pay | Admitting: Cardiovascular Disease

## 2016-06-03 ENCOUNTER — Ambulatory Visit (INDEPENDENT_AMBULATORY_CARE_PROVIDER_SITE_OTHER): Payer: Medicare PPO | Admitting: Interventional Cardiology

## 2016-06-03 ENCOUNTER — Encounter (HOSPITAL_BASED_OUTPATIENT_CLINIC_OR_DEPARTMENT_OTHER): Payer: Self-pay

## 2016-06-03 DIAGNOSIS — I130 Hypertensive heart and chronic kidney disease with heart failure and stage 1 through stage 4 chronic kidney disease, or unspecified chronic kidney disease: Secondary | ICD-10-CM | POA: Insufficient documentation

## 2016-06-03 DIAGNOSIS — I059 Rheumatic mitral valve disease, unspecified: Secondary | ICD-10-CM

## 2016-06-03 DIAGNOSIS — Z8673 Personal history of transient ischemic attack (TIA), and cerebral infarction without residual deficits: Secondary | ICD-10-CM | POA: Insufficient documentation

## 2016-06-03 DIAGNOSIS — I5032 Chronic diastolic (congestive) heart failure: Secondary | ICD-10-CM | POA: Insufficient documentation

## 2016-06-03 DIAGNOSIS — Z5181 Encounter for therapeutic drug level monitoring: Secondary | ICD-10-CM

## 2016-06-03 DIAGNOSIS — I4891 Unspecified atrial fibrillation: Secondary | ICD-10-CM

## 2016-06-03 DIAGNOSIS — Z7982 Long term (current) use of aspirin: Secondary | ICD-10-CM

## 2016-06-03 DIAGNOSIS — Z7984 Long term (current) use of oral hypoglycemic drugs: Secondary | ICD-10-CM | POA: Insufficient documentation

## 2016-06-03 DIAGNOSIS — N189 Chronic kidney disease, unspecified: Secondary | ICD-10-CM

## 2016-06-03 DIAGNOSIS — Z7901 Long term (current) use of anticoagulants: Secondary | ICD-10-CM | POA: Insufficient documentation

## 2016-06-03 DIAGNOSIS — W228XXD Striking against or struck by other objects, subsequent encounter: Secondary | ICD-10-CM

## 2016-06-03 DIAGNOSIS — E1122 Type 2 diabetes mellitus with diabetic chronic kidney disease: Secondary | ICD-10-CM | POA: Insufficient documentation

## 2016-06-03 DIAGNOSIS — S81812D Laceration without foreign body, left lower leg, subsequent encounter: Secondary | ICD-10-CM | POA: Insufficient documentation

## 2016-06-03 DIAGNOSIS — Z9889 Other specified postprocedural states: Secondary | ICD-10-CM

## 2016-06-03 LAB — CBC WITH DIFFERENTIAL/PLATELET
BASOS ABS: 0 10*3/uL (ref 0.0–0.1)
Basophils Relative: 0 %
Eosinophils Absolute: 0.4 10*3/uL (ref 0.0–0.7)
Eosinophils Relative: 6 %
HEMATOCRIT: 35.5 % — AB (ref 36.0–46.0)
Hemoglobin: 11.2 g/dL — ABNORMAL LOW (ref 12.0–15.0)
LYMPHS PCT: 12 %
Lymphs Abs: 0.9 10*3/uL (ref 0.7–4.0)
MCH: 32.3 pg (ref 26.0–34.0)
MCHC: 31.5 g/dL (ref 30.0–36.0)
MCV: 102.3 fL — AB (ref 78.0–100.0)
MONO ABS: 0.4 10*3/uL (ref 0.1–1.0)
Monocytes Relative: 6 %
NEUTROS ABS: 5.6 10*3/uL (ref 1.7–7.7)
Neutrophils Relative %: 76 %
Platelets: 182 10*3/uL (ref 150–400)
RBC: 3.47 MIL/uL — AB (ref 3.87–5.11)
RDW: 17.4 % — AB (ref 11.5–15.5)
WBC: 7.4 10*3/uL (ref 4.0–10.5)

## 2016-06-03 LAB — PROTIME-INR
INR: 3.48 — ABNORMAL HIGH (ref 0.00–1.49)
Prothrombin Time: 34.2 seconds — ABNORMAL HIGH (ref 11.6–15.2)

## 2016-06-03 MED ORDER — LIDOCAINE-EPINEPHRINE (PF) 2 %-1:200000 IJ SOLN
10.0000 mL | Freq: Once | INTRAMUSCULAR | Status: DC
  Filled 2016-06-03: qty 20

## 2016-06-03 MED ORDER — OXYCODONE-ACETAMINOPHEN 5-325 MG PO TABS
1.0000 | ORAL_TABLET | Freq: Once | ORAL | Status: AC
  Administered 2016-06-03: 1 via ORAL
  Filled 2016-06-03: qty 1

## 2016-06-03 MED ORDER — "THROMBI-PAD 3""X3"" EX PADS"
1.0000 | MEDICATED_PAD | Freq: Once | CUTANEOUS | Status: AC
  Administered 2016-06-03: 1 via TOPICAL

## 2016-06-03 MED ORDER — "THROMBI-PAD 3""X3"" EX PADS"
1.0000 | MEDICATED_PAD | Freq: Once | CUTANEOUS | Status: AC
  Administered 2016-06-03: 1 via TOPICAL
  Filled 2016-06-03: qty 1

## 2016-06-03 NOTE — ED Notes (Signed)
Pt request to go to [facility]. MD aware pt walks with walker at home and wants to see how pt does with walker to BR.

## 2016-06-03 NOTE — ED Notes (Signed)
Pt tolerated going to BR without event. No bleeding from leg noted.

## 2016-06-03 NOTE — ED Notes (Addendum)
Injured left LE yesterday-per daughter pr rolled skin back with heavy bleeding-was seen by PCP and [place] yesterday-bleeding has cont'd-pt with bulky dsgs to LE-was advised by PCP to return to ED-pt presents to triage in w/c-NAD

## 2016-06-03 NOTE — Discharge Instructions (Signed)
FOLLOW UP WITH DR. TABORI OR RETURN HERE WITH ANY WORSENING SYMPTOMS.

## 2016-06-03 NOTE — Discharge Instructions (Signed)
Kelsey Cochran,  Thank you for coming in. Please take off the ACE bandage later today. Keep bandage in place for 48 hours. If you notice bleeding with dressing change, you may use more thrombi-pad. Return if bleeding does not stop, if you feel dizzy, if you notice persistent paleness. By next week, you may follow the care suggestions below and change dressing daily.  Skin Tear Care A skin tear is a wound in which the top layer of skin has peeled off. This is a common problem with aging because the skin becomes thinner and more fragile as a person gets older. In addition, some medicines, such as oral corticosteroids, can lead to skin thinning if taken for long periods of time.  A skin tear is often repaired with tape or skin adhesive strips. This keeps the skin that has been peeled off in contact with the healthier skin beneath. Depending on the location of the wound, a bandage (dressing) may be applied over the tape or skin adhesive strips. Sometimes, during the healing process, the skin turns black and dies. Even when this happens, the torn skin acts as a good dressing until the skin underneath gets healthier and repairs itself. HOME CARE INSTRUCTIONS   Change dressings once per day or as directed by your caregiver.  Gently clean the skin tear and the area around the tear using saline solution or mild soap and water.  Do not rub the injured skin dry. Let the area air dry.  Apply petroleum jelly or an antibiotic cream or ointment to keep the tear moist. This will help the wound heal. Do not allow a scab to form.  If the dressing sticks before the next dressing change, moisten it with warm soapy water and gently remove it.  Protect the injured skin until it has healed.  Only take over-the-counter or prescription medicines as directed by your caregiver.  Take showers or baths using warm soapy water. Apply a new dressing after the shower or bath.  Keep all follow-up appointments as directed by your  caregiver.  SEEK IMMEDIATE MEDICAL CARE IF:   You have redness, swelling, or increasing pain in the skin tear.  You havepus coming from the skin tear.  You have chills.  You have a red streak that goes away from the skin tear.  You have a bad smell coming from the tear or dressing.  You have a fever or persistent symptoms for more than 2-3 days.  You have a fever and your symptoms suddenly get worse. MAKE SURE YOU:  Understand these instructions.  Will watch this condition.  Will get help right away if your child is not doing well or gets worse.   This information is not intended to replace advice given to you by your health care provider. Make sure you discuss any questions you have with your health care provider.   Document Released: 07/27/2001 Document Revised: 07/26/2012 Document Reviewed: 05/15/2012 Elsevier Interactive Patient Education [employer] Inc.

## 2016-06-03 NOTE — Telephone Encounter (Signed)
Patient's daughter calling to report patient was seen in the office yesterday and in the ER last night for laceration on leg.  Today the gash has reopened and is bleeding, they are requesting a call back asap to determine what they should due since patient is on blood thinner.

## 2016-06-03 NOTE — Telephone Encounter (Signed)
New message    The was brought in the ER department, and the PT/INR was checked last night the daughter wants to know what adjustment needs to be made on the medication.

## 2016-06-03 NOTE — ED Provider Notes (Signed)
I saw and evaluated the patient, reviewed the resident's note and I agree with the findings and plan.  Was seen here last night for bleeding from a skin tear secondary to hitting it on a car has had intermittent bleeding since then and PCP told to come here for further evaluation. On examination here she has decreased bleeding to a very slow ooze and without any intervention the bleeding had stopped. Wound was dressed with thrombin and pressure dressing with an Ace wrap. Patient without any evidence of acute blood loss anemia so we'll give strict return precautions otherwise no change dressing in a couple days with the wound is just a little bit more.  Marily MemosJason Aylyn Wenzler, MD 06/03/16 [phone]

## 2016-06-03 NOTE — Telephone Encounter (Signed)
Addressed by Elmarie Shileyiffany in Coumadin Clinic. Patient will stay on her same dose of coumadin and follow-up with their clinic in 2 weeks.

## 2016-06-03 NOTE — ED Provider Notes (Signed)
CSN: [id number]     Arrival date & time 06/03/16  1302 History   First MD Initiated Contact with Patient 06/03/16 1309     Chief Complaint  Patient presents with  . Leg Injury    (Consider location/radiation/quality/duration/timing/severity/associated sxs/prior Treatment) HPI  Kelsey Cochran is an 85-y/o female with mechanical valve on coumadin who presents for continued bleeding of skin tear of left shin. She injured her left shin by accidentally hitting her leg on an open car door. She saw her PCP yesterday, and pressure dressing was applied. Throughout the day, she continued to bleed through the dressing, so she went to the ED. There, thrombi-pad was applied. Bleeding continued with first application, so another was applied. Bleeding seemed to stop and INR was not supratherapeutic, so patient was discharged home. She began having bleeding again in the car on the way home. She seemed to do well overnight but had more bleeding through her bandage this morning after she placed weight on her leg when she got up to go to the bathroom. Family is concerned that bleeding hasn't stopped. Patient denies increased leg swelling. Denies pain of legs at rest. At home, she uses a walker or a scooter to get around.   Past Medical History  Diagnosis Date  . S/P MVR (mitral valve replacement) 2005    [facility] 2005  [facility]  . Diastolic CHF, chronic (HCC)   . Hypertension   . Pulmonary hypertension (HCC)   . Arthritis   . TIA (transient ischemic attack)   . Diabetes mellitus   . Hypercholesterolemia   . TIA (transient ischemic attack)   . Anemia   . CRF (chronic renal failure)   . S/P tricuspid valve repair 2005    [facility]  . Atrial fibrillation [facility](HCC)    Past Surgical History  Procedure Laterality Date  . Knee arthroscopy      06/2007- Right knee Dr. [place]  . Mitral valve replacement      TV annuloplasty- [id number]  . Shoulder surgery      rotato cuff 02/2006 [facility]  . Abdominal  hysterectomy    . Hip fracture surgery      L hip  . Tonsillectomy    . Appendectomy    . Hernia repair     Family History  Problem Relation Age of Onset  . Asthma Daughter   . Allergies Daughter   . Allergies Daughter   . Cancer Sister     bladder   Social History  Substance Use Topics  . Smoking status: Never Smoker   . Smokeless tobacco: Never Used  . Alcohol Use: No   OB History    No data available     Review of Systems  Constitutional: Negative for fever and chills.  Respiratory: Negative for shortness of breath.   Cardiovascular: Positive for leg swelling.  Gastrointestinal: Negative for nausea and diarrhea.  Skin: Positive for wound.  Neurological: Negative for weakness, light-headedness and headaches.  Hematological: Bruises/bleeds easily.  Psychiatric/Behavioral: Negative for confusion.   Allergies  Meperidine hcl; Sulfamethoxazole-trimethoprim; and Heparin  Home Medications   Prior to Admission medications   Medication Sig Start Date End Date Taking? Authorizing Provider  albuterol (PROVENTIL) (2.5 MG/3ML) 0.083% nebulizer solution Take 3 mLs (2.5 mg total) by nebulization every 6 (six) hours as needed for wheezing or shortness of breath. 12/04/15   Sheliah HatchKatherine E Tabori, MD  alendronate (FOSAMAX) 35 MG tablet TAKE 1 TABLET EVERY 7 DAYS IN THE MORNING AS  DIRECTED. SEE PACKAGE FOR ADDITIONAL INSTRUCTIONS 04/21/16   Sheliah Hatch, MD  aspirin 81 MG tablet Take 81 mg by mouth daily.      Historical Provider, MD  atorvastatin (LIPITOR) 20 MG tablet Take 20 mg by mouth daily. 02/03/16   Historical Provider, MD  busPIRone (BUSPAR) 7.5 MG tablet TAKE 1 TABLET BY MOUTH TWICE DAILY 12/31/15   Sheliah Hatch, MD  carvedilol (COREG) 25 MG tablet Take 0.5 tablets (12.5 mg total) by mouth daily. 10/23/15   Wendall Stade, MD  Cholecalciferol (VITAMIN D3) 5000 UNITS CAPS Take 1 capsule by mouth daily. Reported on 12/25/2015    Historical Provider, MD  Diphenhydramine-APAP,  sleep, (TYLENOL PM EXTRA STRENGTH PO) Take 1 tablet by mouth daily as needed (for sleep). As needed    Historical Provider, MD  docusate sodium (COLACE) 100 MG capsule Take 100 mg by mouth 2 (two) times daily.      Historical Provider, MD  enoxaparin (LOVENOX) 80 MG/0.8ML injection Inject 0.8 mLs (80 mg total) into the skin daily. 04/27/16   Wendall Stade, MD  fenofibrate 160 MG tablet Take 160 mg by mouth daily. 01/28/16   Historical Provider, MD  folic acid (FOLVITE) 800 MCG tablet Take 400 mcg by mouth daily.      Historical Provider, MD  furosemide (LASIX) 80 MG tablet Take 1 tablet (80 mg total) by mouth daily. 04/27/16   Wendall Stade, MD  glipiZIDE (GLUCOTROL XL) 2.5 MG 24 hr tablet TAKE 1 TABLET BY MOUTH DAILY 02/26/16   Sheliah Hatch, MD  isosorbide mononitrate (IMDUR) 30 MG 24 hr tablet Take 30 mg by mouth daily.    Historical Provider, MD  methocarbamol (ROBAXIN) 500 MG tablet Take 500 mg by mouth 2 (two) times daily as needed for muscle spasms.    Historical Provider, MD  nitroGLYCERIN (NITROSTAT) 0.4 MG SL tablet Place 1 tablet (0.4 mg total) under the tongue every 5 (five) minutes as needed for chest pain. 03/26/16   Wendall Stade, MD  oxyCODONE-acetaminophen (ROXICET) 5-325 MG tablet Take 1 tablet by mouth every 8 (eight) hours as needed for severe pain. 05/28/16   Sheliah Hatch, MD  sertraline (ZOLOFT) 50 MG tablet TAKE 1 TABLET(50 MG) BY MOUTH DAILY 04/26/16   Sheliah Hatch, MD  triamcinolone cream (KENALOG) 0.1 % Apply 1 application topically 2 (two) times daily. 04/11/15   Sheliah Hatch, MD  warfarin (COUMADIN) 2.5 MG tablet TAKE AS DIRECTED 02/26/16   Wendall Stade, MD   BP 140/65 mmHg  Pulse 76  Temp(Src) 98.8 F (37.1 C) (Oral)  Resp 18  Ht 5\' 3"  (1.6 m)  Wt 68.04 kg  BMI 26.58 kg/m2  SpO2 95% Physical Exam  Constitutional: She is oriented to person, place, and time. She appears well-developed and well-nourished. No distress.  Cardiovascular: Normal  rate, regular rhythm and normal heart sounds.   No murmur heard. S1 click  Pulmonary/Chest: Effort normal and breath sounds normal. No respiratory distress. She has no wheezes. She has no rales.  Abdominal: Soft. Bowel sounds are normal. She exhibits no distension. There is no tenderness. There is no rebound and no guarding.  Musculoskeletal: Normal range of motion. She exhibits edema (1+ pitting edema to mid-shin).  Neurological: She is alert and oriented to person, place, and time.  Sensation of LEs intact.   Skin: Skin is warm and dry.  Lower shins dressed with clean gauze from older wounds bilaterally. Left upper shin with  mild oozing from lateral edges of 4 cm by 0.5 cm area of denuded skin of healing tear. Scab formed above area of denuded skin. Cap refill < 3 seconds.   Psychiatric: She has a normal mood and affect. Her behavior is normal.  Nursing note and vitals reviewed.   ED Course  Procedures (including critical care time) Labs Review Labs Reviewed - No data to display  Imaging Review No results found. I have personally reviewed and evaluated these images and lab results as part of my medical decision-making.   EKG Interpretation None      MDM   Final diagnoses:  Skin tear of left lower leg without complication, subsequent encounter   Pt presents with skin tear of left leg that continues to bleed. Bleeding observed during ED visit minimal. Hemoglobin was stable at 11.2 and INR was within goal of 2.5-3.5 when checked last night. Patient has good color, brisk capillary refill, and no tachycardia. Thrombi-pad applied to open area of left shin. Pressure dressing was applied. Dressing stayed dry even after patient walked with assistance to go to the restroom. Family instructed to remove ACE bandage that is part of pressure dressing later today and to leave bandage in place for 48 hours. Provided return precautions.  Dani Gobble, MD [facility],  PGY-2    [facility], MD 06/03/16 Serena Croissant  Marily Memos, MD 06/04/16 [phone]

## 2016-06-03 NOTE — Telephone Encounter (Signed)
Consulted with Dr. Beverely Lowabori who recommended patient seek treatment at the nearest ER.  Called patient's daughter Clearnce HastenJami back to inform her of pcp's recommendation.  She voiced her understanding and stated she will take patient to ER at [facility].

## 2016-06-03 NOTE — ED Notes (Signed)
Cleaned wound so the PA could place a thrombi pad on it

## 2016-06-10 NOTE — ED Provider Notes (Signed)
[facility]EM[facility]ENCY DEPT Provider Note   CSN: [id number] Arrival date & time: 06/02/16  2132  First Provider Contact:  First MD Initiated Contact with Patient 06/02/16 2341        History   Chief Complaint Chief Complaint  Patient presents with  . Extremity Laceration    HPI Kelsey Cochran is a 82 y.o. female.  Patient presents with bleeding wound to left shin from collision with a car door. The wound has been evaluated by her PCP and she presents here with uncontrolled bleeding at home. She is on coumadin for atrial fibrillation, valve replacement. She has been ambulatory, denies lightheadedness, near syncope or syncope, SOB, chest pain.    The history is provided by the patient and a relative. No language interpreter was used.    Past Medical History:  Diagnosis Date  . Anemia   . Arthritis   . Atrial fibrillation (HCC)   . CRF (chronic renal failure)   . Diabetes mellitus   . Diastolic CHF, chronic (HCC)   . Hypercholesterolemia   . Hypertension   . Pulmonary hypertension (HCC)   . S/P MVR (mitral valve replacement) 2005   [facility] 2005  [facility]  . S/P tricuspid valve repair 2005   [facility]  . TIA (transient ischemic attack)   . TIA (transient ischemic attack)     Patient Active Problem List   Diagnosis Date Noted  . Frequent falls 12/04/2015  . Leg wound, left 12/04/2015  . Generalized anxiety disorder 08/26/2014  . Chest pain 08/12/2014  . Depression 08/12/2014  . Encounter for therapeutic drug monitoring 12/20/2013  . Vomiting 08/24/2013  . Carpal tunnel syndrome 01/01/2013  . Pain in joint, multiple sites 08/01/2012  . Pulmonary hypertension, secondary (HCC) 01/31/2012  . Actinic keratosis 01/18/2012  . Routine general medical examination at a health care facility 04/16/2011  . BICEPS TENDON RUPTURE, RIGHT 01/04/2011  . Mitral valve disease 12/28/2010  . Obstructive chronic bronchitis with exacerbation (HCC) 06/03/2010  . HIP FRACTURE, LEFT  03/16/2010  . POSTMENOPAUSAL STATUS 03/16/2010  . Vitamin D deficiency 09/08/2009  . HIP PAIN 07/10/2009  . LOW BLOOD PRESSURE 06/11/2009  . Fatigue 06/11/2009  . WEIGHT LOSS 06/11/2009  . UNS ADVRS EFF UNS RX MEDICINAL&BIOLOGICAL SBSTNC 11/28/2008  . ANEMIA 10/28/2008  . CAROTID OCCLUSIVE DISEASE 10/07/2008  . DM (diabetes mellitus) with peripheral vascular complication (HCC) 09/11/2008  . HYPERCHOLESTEROLEMIA 09/11/2008  . ESSENTIAL HYPERTENSION, BENIGN 09/11/2008  . ATRIAL FIBRILLATION 09/11/2008  . CHRONIC DIASTOLIC HEART FAILURE 09/11/2008  . TIA 09/11/2008  . MITRAL VALVE REPLACEMENT, HX OF 09/11/2008    Past Surgical History:  Procedure Laterality Date  . ABDOMINAL HYSTERECTOMY    . APPENDECTOMY    . HERNIA REPAIR    . HIP FRACTURE SURGERY     L hip  . KNEE ARTHROSCOPY     06/2007- Right knee Dr. Leslee Home  . MITRAL VALVE REPLACEMENT     TV annuloplasty- [id number]  . SHOULDER SURGERY     rotato cuff 02/2006 [facility]  . TONSILLECTOMY      OB History    No data available       Home Medications    Prior to Admission medications   Medication Sig Start Date End Date Taking? Authorizing Provider  albuterol (PROVENTIL) (2.5 MG/3ML) 0.083% nebulizer solution Take 3 mLs (2.5 mg total) by nebulization every 6 (six) hours as needed for wheezing or shortness of breath. 12/04/15   Sheliah Hatch, MD  alendronate Mindi Slicker)  35 MG tablet TAKE 1 TABLET EVERY 7 DAYS IN THE MORNING AS DIRECTED. SEE PACKAGE FOR ADDITIONAL INSTRUCTIONS 04/21/16   Sheliah Hatch, MD  aspirin 81 MG tablet Take 81 mg by mouth daily.      Historical Provider, MD  atorvastatin (LIPITOR) 20 MG tablet Take 20 mg by mouth daily. 02/03/16   Historical Provider, MD  busPIRone (BUSPAR) 7.5 MG tablet TAKE 1 TABLET BY MOUTH TWICE DAILY 12/31/15   Sheliah Hatch, MD  carvedilol (COREG) 25 MG tablet Take 0.5 tablets (12.5 mg total) by mouth daily. 10/23/15   Wendall Stade, MD  Cholecalciferol  (VITAMIN D3) 5000 UNITS CAPS Take 1 capsule by mouth daily. Reported on 12/25/2015    Historical Provider, MD  Diphenhydramine-APAP, sleep, (TYLENOL PM EXTRA STRENGTH PO) Take 1 tablet by mouth daily as needed (for sleep). As needed    Historical Provider, MD  docusate sodium (COLACE) 100 MG capsule Take 100 mg by mouth 2 (two) times daily.      Historical Provider, MD  enoxaparin (LOVENOX) 80 MG/0.8ML injection Inject 0.8 mLs (80 mg total) into the skin daily. 04/27/16   Wendall Stade, MD  fenofibrate 160 MG tablet Take 160 mg by mouth daily. 01/28/16   Historical Provider, MD  folic acid (FOLVITE) 800 MCG tablet Take 400 mcg by mouth daily.      Historical Provider, MD  furosemide (LASIX) 80 MG tablet Take 1 tablet (80 mg total) by mouth daily. 04/27/16   Wendall Stade, MD  glipiZIDE (GLUCOTROL XL) 2.5 MG 24 hr tablet TAKE 1 TABLET BY MOUTH DAILY 02/26/16   Sheliah Hatch, MD  isosorbide mononitrate (IMDUR) 30 MG 24 hr tablet Take 30 mg by mouth daily.    Historical Provider, MD  methocarbamol (ROBAXIN) 500 MG tablet Take 500 mg by mouth 2 (two) times daily as needed for muscle spasms.    Historical Provider, MD  nitroGLYCERIN (NITROSTAT) 0.4 MG SL tablet Place 1 tablet (0.4 mg total) under the tongue every 5 (five) minutes as needed for chest pain. 03/26/16   Wendall Stade, MD  oxyCODONE-acetaminophen (ROXICET) 5-325 MG tablet Take 1 tablet by mouth every 8 (eight) hours as needed for severe pain. 05/28/16   Sheliah Hatch, MD  sertraline (ZOLOFT) 50 MG tablet TAKE 1 TABLET(50 MG) BY MOUTH DAILY 04/26/16   Sheliah Hatch, MD  triamcinolone cream (KENALOG) 0.1 % Apply 1 application topically 2 (two) times daily. 04/11/15   Sheliah Hatch, MD  warfarin (COUMADIN) 2.5 MG tablet TAKE AS DIRECTED 02/26/16   Wendall Stade, MD    Family History Family History  Problem Relation Age of Onset  . Asthma Daughter   . Allergies Daughter   . Allergies Daughter   . Cancer Sister     bladder     Social History Social History  Substance Use Topics  . Smoking status: Never Smoker  . Smokeless tobacco: Never Used  . Alcohol use No     Allergies   Meperidine hcl; Sulfamethoxazole-trimethoprim; and Heparin   Review of Systems Review of Systems  Constitutional: Negative for fatigue.  Respiratory: Negative for shortness of breath.   Cardiovascular: Negative for chest pain.  Gastrointestinal: Negative for abdominal pain, nausea and vomiting.  Musculoskeletal:       C/O left LE pain and soreness associated with wound.  Neurological: Negative for syncope, weakness, light-headedness and numbness.  Hematological: Bruises/bleeds easily.     Physical Exam Updated Vital Signs BP  135/95 (BP Location: Right Arm)   Pulse 76   Temp 97.8 F (36.6 C) (Oral)   Resp 19   Ht 5\' 2"  (1.575 m)   Wt 68.9 kg   SpO2 100%   BMI 27.80 kg/m   Physical Exam  Constitutional: She appears well-developed and well-nourished. No distress.  HENT:  Head: Normocephalic.  Eyes: Conjunctivae are normal.  Neck: Normal range of motion.  Cardiovascular: Normal rate.   Pulmonary/Chest: Effort normal.  Musculoskeletal: Normal range of motion. She exhibits no deformity.  Skin: Skin is warm and dry.  4 cm skin tear to anterior midshaft left LE. No surround hematoma or swelling. There is active bleeding from the medial margin of the wound that is a continuous non-pulsating stream.      ED Treatments / Results  Labs (all labs ordered are listed, but only abnormal results are displayed) Labs Reviewed  CBC WITH DIFFERENTIAL/PLATELET - Abnormal; Notable for the following:       Result Value   RBC 3.47 (*)    Hemoglobin 11.2 (*)    HCT 35.5 (*)    MCV 102.3 (*)    RDW 17.4 (*)    All other components within normal limits  PROTIME-INR - Abnormal; Notable for the following:    Prothrombin Time 34.2 (*)    INR 3.48 (*)    All other components within normal limits    EKG  EKG  Interpretation None       Radiology No results found.  Procedures Procedures (including critical care time)  Medications Ordered in ED Medications  oxyCODONE-acetaminophen (PERCOCET/ROXICET) 5-325 MG per tablet 1 tablet (1 tablet Oral Given 06/03/16 0016)  THROMBI-PAD (THROMBI-PAD) 3"X3" pad 1 each (1 each Topical Given 06/03/16 0015)     Initial Impression / Assessment and Plan / ED Course  I have reviewed the triage vital signs and the nursing notes.  Pertinent labs & imaging results that were available during my care of the patient were reviewed by me and considered in my medical decision making (see chart for details).  Clinical Course    Patient with coagulopathy secondary to Coumadin presents with uncontrolled bleeding of wound to left leg. Thrombi-pad applied x 2 applications. INR in target range for valvular disease. Bleeding is controlled and patient is evaluated by Dr. Bebe Shaggy. She is felt stable for discharge home. Return precautions discussed. Encouraged to have close follow up with PCP.   Final Clinical Impressions(s) / ED Diagnoses   Final diagnoses:  Bleeding from wound [facility])  Coagulopathy [facility])    New Prescriptions Discharge Medication List as of 06/03/2016  2:21 AM       Elpidio Anis, PA-C 06/10/16 0630    Zadie Rhine, MD 06/23/16 [phone]

## 2016-06-14 ENCOUNTER — Other Ambulatory Visit: Payer: Self-pay | Admitting: Cardiovascular Disease

## 2016-06-14 ENCOUNTER — Other Ambulatory Visit: Payer: Self-pay | Admitting: Family Medicine

## 2016-06-14 ENCOUNTER — Telehealth: Payer: Self-pay | Admitting: Family Medicine

## 2016-06-14 NOTE — Telephone Encounter (Signed)
Medication  spironolactone (ALDACTONE) 25 MG tablet [[id number]]  spironolactone (ALDACTONE) 25 MG tablet [[id number]] DISCONTINUED  Order Details  Dose: 25 mg Route: Oral Frequency: Daily  Dispense Quantity:  -- Refills:  -- Fills remaining:  --        Sig: Take 25 mg by mouth daily.       Discontinue Date:  05/20/2016 1335 Discontinue User:  Laurice Record, RN Discontinue Reason:  Discontinued by provider  Written Date:  -- Expiration Date:  -- Ordering Date:  09/15/15   Start Date:  -- End Date:  05/20/16         Ordering Provider:  -- DEA #:  -- NPI:  --   Authorizing Provider:  Historical Provider, MD DEA #:  -- NPI:  [id number]   Ordering User:  Awilda Bill, CMA            Pharmacy Comments:  --       Fill quantity remaining:  -- Fill quantity used:  --

## 2016-06-14 NOTE — Telephone Encounter (Signed)
Caller with Kelsey Cochran asking for a verbal to add occupational therapy to current order for pt.

## 2016-06-15 ENCOUNTER — Telehealth: Payer: Self-pay | Admitting: Family Medicine

## 2016-06-15 ENCOUNTER — Other Ambulatory Visit: Payer: Self-pay | Admitting: Family Medicine

## 2016-06-15 NOTE — Telephone Encounter (Signed)
Caller is a PT with [facility] asking for verbal order on a plan of care to see pt 2x a wk for 4 wks and then 1x a wk for 1 wk. Caller is aware that KT is out of office until tomorrow.

## 2016-06-15 NOTE — Telephone Encounter (Signed)
Okay to give verbal orders for below

## 2016-06-16 NOTE — Telephone Encounter (Signed)
Medication filled to pharmacy as requested.   

## 2016-06-16 NOTE — Telephone Encounter (Signed)
Ok to proceed as requested.

## 2016-06-16 NOTE — Telephone Encounter (Signed)
Verbal ok given.

## 2016-06-17 ENCOUNTER — Other Ambulatory Visit: Payer: Medicare PPO | Admitting: *Deleted

## 2016-06-17 ENCOUNTER — Telehealth: Payer: Self-pay | Admitting: Cardiovascular Disease

## 2016-06-17 ENCOUNTER — Ambulatory Visit (INDEPENDENT_AMBULATORY_CARE_PROVIDER_SITE_OTHER): Payer: Medicare PPO

## 2016-06-17 DIAGNOSIS — I059 Rheumatic mitral valve disease, unspecified: Secondary | ICD-10-CM

## 2016-06-17 DIAGNOSIS — Z5181 Encounter for therapeutic drug level monitoring: Secondary | ICD-10-CM

## 2016-06-17 DIAGNOSIS — Z9889 Other specified postprocedural states: Secondary | ICD-10-CM | POA: Diagnosis not present

## 2016-06-17 DIAGNOSIS — I4891 Unspecified atrial fibrillation: Secondary | ICD-10-CM | POA: Diagnosis not present

## 2016-06-17 DIAGNOSIS — Z79899 Other long term (current) drug therapy: Secondary | ICD-10-CM

## 2016-06-17 LAB — BASIC METABOLIC PANEL
BUN: 41 mg/dL — AB (ref 7–25)
CO2: 24 mmol/L (ref 20–31)
CREATININE: 1.67 mg/dL — AB (ref 0.60–0.88)
Calcium: 8.5 mg/dL — ABNORMAL LOW (ref 8.6–10.4)
Chloride: 102 mmol/L (ref 98–110)
Glucose, Bld: 101 mg/dL — ABNORMAL HIGH (ref 65–99)
Potassium: 4.3 mmol/L (ref 3.5–5.3)
SODIUM: 140 mmol/L (ref 135–146)

## 2016-06-17 LAB — POCT INR

## 2016-06-17 LAB — PROTIME-INR
INR: 6.84 — AB
PROTHROMBIN TIME: 61.5 s — AB (ref 11.4–15.2)

## 2016-06-17 NOTE — Telephone Encounter (Signed)
[facility] called with a critical INR 6.84. Informed Erika RN in coumadin lab of patient's INR. See encounter from anti-coag visit.

## 2016-06-18 ENCOUNTER — Inpatient Hospital Stay (HOSPITAL_COMMUNITY)
Admission: AD | Admit: 2016-06-18 | Discharge: 2016-06-23 | DRG: 291 | Disposition: A | Discharge status: Hospice | Payer: Medicare PPO | Source: Ambulatory Visit | Attending: Cardiovascular Disease | Admitting: Cardiovascular Disease

## 2016-06-18 ENCOUNTER — Ambulatory Visit (INDEPENDENT_AMBULATORY_CARE_PROVIDER_SITE_OTHER): Payer: Medicare PPO | Admitting: Cardiovascular Disease

## 2016-06-18 ENCOUNTER — Telehealth: Payer: Self-pay | Admitting: Cardiovascular Disease

## 2016-06-18 ENCOUNTER — Encounter: Payer: Self-pay | Admitting: Cardiovascular Disease

## 2016-06-18 VITALS — BP 119/50 | HR 73 | Ht 62.0 in | Wt 164.0 lb

## 2016-06-18 DIAGNOSIS — R296 Repeated falls: Secondary | ICD-10-CM | POA: Diagnosis not present

## 2016-06-18 DIAGNOSIS — R627 Adult failure to thrive: Secondary | ICD-10-CM | POA: Diagnosis present

## 2016-06-18 DIAGNOSIS — Z7951 Long term (current) use of inhaled steroids: Secondary | ICD-10-CM

## 2016-06-18 DIAGNOSIS — Z8673 Personal history of transient ischemic attack (TIA), and cerebral infarction without residual deficits: Secondary | ICD-10-CM

## 2016-06-18 DIAGNOSIS — I059 Rheumatic mitral valve disease, unspecified: Secondary | ICD-10-CM

## 2016-06-18 DIAGNOSIS — Z993 Dependence on wheelchair: Secondary | ICD-10-CM

## 2016-06-18 DIAGNOSIS — I272 Other secondary pulmonary hypertension: Secondary | ICD-10-CM | POA: Diagnosis present

## 2016-06-18 DIAGNOSIS — R791 Abnormal coagulation profile: Secondary | ICD-10-CM | POA: Diagnosis present

## 2016-06-18 DIAGNOSIS — I482 Chronic atrial fibrillation: Secondary | ICD-10-CM | POA: Diagnosis present

## 2016-06-18 DIAGNOSIS — I270 Primary pulmonary hypertension: Secondary | ICD-10-CM

## 2016-06-18 DIAGNOSIS — E8770 Fluid overload, unspecified: Secondary | ICD-10-CM | POA: Diagnosis not present

## 2016-06-18 DIAGNOSIS — Z7901 Long term (current) use of anticoagulants: Secondary | ICD-10-CM

## 2016-06-18 DIAGNOSIS — L8990 Pressure ulcer of unspecified site, unspecified stage: Secondary | ICD-10-CM | POA: Insufficient documentation

## 2016-06-18 DIAGNOSIS — K761 Chronic passive congestion of liver: Secondary | ICD-10-CM | POA: Diagnosis present

## 2016-06-18 DIAGNOSIS — E7800 Pure hypercholesterolemia, unspecified: Secondary | ICD-10-CM | POA: Diagnosis present

## 2016-06-18 DIAGNOSIS — S81801D Unspecified open wound, right lower leg, subsequent encounter: Secondary | ICD-10-CM | POA: Diagnosis not present

## 2016-06-18 DIAGNOSIS — Z7984 Long term (current) use of oral hypoglycemic drugs: Secondary | ICD-10-CM

## 2016-06-18 DIAGNOSIS — E1122 Type 2 diabetes mellitus with diabetic chronic kidney disease: Secondary | ICD-10-CM | POA: Diagnosis present

## 2016-06-18 DIAGNOSIS — Z952 Presence of prosthetic heart valve: Secondary | ICD-10-CM | POA: Diagnosis not present

## 2016-06-18 DIAGNOSIS — Z66 Do not resuscitate: Secondary | ICD-10-CM | POA: Diagnosis present

## 2016-06-18 DIAGNOSIS — I130 Hypertensive heart and chronic kidney disease with heart failure and stage 1 through stage 4 chronic kidney disease, or unspecified chronic kidney disease: Secondary | ICD-10-CM | POA: Diagnosis present

## 2016-06-18 DIAGNOSIS — Z825 Family history of asthma and other chronic lower respiratory diseases: Secondary | ICD-10-CM

## 2016-06-18 DIAGNOSIS — I5033 Acute on chronic diastolic (congestive) heart failure: Secondary | ICD-10-CM | POA: Diagnosis present

## 2016-06-18 DIAGNOSIS — Z7982 Long term (current) use of aspirin: Secondary | ICD-10-CM | POA: Diagnosis not present

## 2016-06-18 DIAGNOSIS — Z7401 Bed confinement status: Secondary | ICD-10-CM

## 2016-06-18 DIAGNOSIS — F329 Major depressive disorder, single episode, unspecified: Secondary | ICD-10-CM | POA: Diagnosis present

## 2016-06-18 DIAGNOSIS — Z79899 Other long term (current) drug therapy: Secondary | ICD-10-CM

## 2016-06-18 DIAGNOSIS — I509 Heart failure, unspecified: Secondary | ICD-10-CM | POA: Diagnosis not present

## 2016-06-18 DIAGNOSIS — N184 Chronic kidney disease, stage 4 (severe): Secondary | ICD-10-CM | POA: Diagnosis present

## 2016-06-18 DIAGNOSIS — E1151 Type 2 diabetes mellitus with diabetic peripheral angiopathy without gangrene: Secondary | ICD-10-CM | POA: Diagnosis not present

## 2016-06-18 DIAGNOSIS — Z954 Presence of other heart-valve replacement: Secondary | ICD-10-CM | POA: Diagnosis not present

## 2016-06-18 DIAGNOSIS — N183 Chronic kidney disease, stage 3 (moderate): Secondary | ICD-10-CM | POA: Diagnosis not present

## 2016-06-18 DIAGNOSIS — Z515 Encounter for palliative care: Secondary | ICD-10-CM | POA: Diagnosis not present

## 2016-06-18 DIAGNOSIS — Z9889 Other specified postprocedural states: Secondary | ICD-10-CM

## 2016-06-18 DIAGNOSIS — S81812A Laceration without foreign body, left lower leg, initial encounter: Secondary | ICD-10-CM | POA: Diagnosis not present

## 2016-06-18 LAB — PROTIME-INR
INR: 5.31
Prothrombin Time: 50.2 seconds — ABNORMAL HIGH (ref 11.4–15.2)

## 2016-06-18 MED ORDER — SODIUM CHLORIDE 0.9% FLUSH
3.0000 mL | Freq: Two times a day (BID) | INTRAVENOUS | Status: DC
  Administered 2016-06-18 – 2016-06-23 (×10): 3 mL via INTRAVENOUS

## 2016-06-18 MED ORDER — SERTRALINE HCL 50 MG PO TABS
50.0000 mg | ORAL_TABLET | Freq: Every day | ORAL | Status: DC
  Administered 2016-06-18 – 2016-06-22 (×5): 50 mg via ORAL
  Filled 2016-06-18 (×6): qty 1

## 2016-06-18 MED ORDER — ISOSORBIDE MONONITRATE ER 30 MG PO TB24
30.0000 mg | ORAL_TABLET | Freq: Every day | ORAL | Status: DC
  Administered 2016-06-19 – 2016-06-23 (×5): 30 mg via ORAL
  Filled 2016-06-18 (×5): qty 1

## 2016-06-18 MED ORDER — SODIUM CHLORIDE 0.9 % IV SOLN: 250.0000 mL | INTRAVENOUS | Status: DC | PRN

## 2016-06-18 MED ORDER — FUROSEMIDE 10 MG/ML IJ SOLN
40.0000 mg | Freq: Once | INTRAMUSCULAR | Status: AC
  Administered 2016-06-18: 40 mg via INTRAVENOUS
  Filled 2016-06-18: qty 4

## 2016-06-18 MED ORDER — BUSPIRONE HCL 15 MG PO TABS
7.5000 mg | ORAL_TABLET | Freq: Two times a day (BID) | ORAL | Status: DC
  Administered 2016-06-18 – 2016-06-23 (×10): 7.5 mg via ORAL
  Filled 2016-06-18 (×10): qty 1

## 2016-06-18 MED ORDER — DOCUSATE SODIUM 100 MG PO CAPS
100.0000 mg | ORAL_CAPSULE | Freq: Two times a day (BID) | ORAL | Status: DC
  Administered 2016-06-18 – 2016-06-23 (×10): 100 mg via ORAL
  Filled 2016-06-18 (×10): qty 1

## 2016-06-18 MED ORDER — ALBUTEROL SULFATE (2.5 MG/3ML) 0.083% IN NEBU: 2.5000 mg | INHALATION_SOLUTION | Freq: Four times a day (QID) | RESPIRATORY_TRACT | Status: DC | PRN

## 2016-06-18 MED ORDER — ATORVASTATIN CALCIUM 20 MG PO TABS
20.0000 mg | ORAL_TABLET | Freq: Every day | ORAL | Status: DC
  Administered 2016-06-18 – 2016-06-20 (×3): 20 mg via ORAL
  Filled 2016-06-18 (×3): qty 1

## 2016-06-18 MED ORDER — OXYCODONE-ACETAMINOPHEN 5-325 MG PO TABS
1.0000 | ORAL_TABLET | Freq: Three times a day (TID) | ORAL | Status: DC | PRN
  Administered 2016-06-18 – 2016-06-20 (×4): 1 via ORAL
  Filled 2016-06-18 (×4): qty 1

## 2016-06-18 MED ORDER — SODIUM CHLORIDE 0.9% FLUSH: 3.0000 mL | INTRAVENOUS | Status: DC | PRN

## 2016-06-18 MED ORDER — ONDANSETRON HCL 4 MG/2ML IJ SOLN: 4.0000 mg | Freq: Four times a day (QID) | INTRAMUSCULAR | Status: DC | PRN

## 2016-06-18 MED ORDER — FUROSEMIDE 10 MG/ML IJ SOLN
40.0000 mg | Freq: Two times a day (BID) | INTRAMUSCULAR | Status: DC
  Administered 2016-06-19 – 2016-06-20 (×3): 40 mg via INTRAVENOUS
  Filled 2016-06-18 (×3): qty 4

## 2016-06-18 MED ORDER — CARVEDILOL 12.5 MG PO TABS
12.5000 mg | ORAL_TABLET | Freq: Every day | ORAL | Status: DC
  Administered 2016-06-18 – 2016-06-20 (×3): 12.5 mg via ORAL
  Filled 2016-06-18 (×3): qty 1

## 2016-06-18 MED ORDER — INSULIN ASPART 100 UNIT/ML ~~LOC~~ SOLN
0.0000 [IU] | Freq: Three times a day (TID) | SUBCUTANEOUS | Status: DC
  Administered 2016-06-19 – 2016-06-20 (×3): 2 [IU] via SUBCUTANEOUS
  Administered 2016-06-20 – 2016-06-21 (×3): 3 [IU] via SUBCUTANEOUS
  Administered 2016-06-22 (×2): 5 [IU] via SUBCUTANEOUS
  Administered 2016-06-23: 3 [IU] via SUBCUTANEOUS

## 2016-06-18 MED ORDER — ACETAMINOPHEN 325 MG PO TABS
650.0000 mg | ORAL_TABLET | ORAL | Status: DC | PRN
  Administered 2016-06-19 – 2016-06-22 (×2): 650 mg via ORAL
  Filled 2016-06-18 (×3): qty 2

## 2016-06-18 MED ORDER — FENOFIBRATE 160 MG PO TABS
160.0000 mg | ORAL_TABLET | Freq: Every day | ORAL | Status: DC
  Administered 2016-06-19 – 2016-06-20 (×2): 160 mg via ORAL
  Filled 2016-06-18 (×2): qty 1

## 2016-06-18 MED ORDER — DIPHENHYDRAMINE HCL 25 MG PO CAPS
25.0000 mg | ORAL_CAPSULE | Freq: Every evening | ORAL | Status: DC | PRN
  Administered 2016-06-18 – 2016-06-22 (×5): 25 mg via ORAL
  Filled 2016-06-18 (×6): qty 1

## 2016-06-18 NOTE — Telephone Encounter (Signed)
New message    Pt c/o swelling: STAT is pt has developed SOB within 24 hours  1. How long have you been experiencing swelling? 06/17/16 2. Where is the swelling located? Both legs, skin tears with fluid coming out, she has gained 13 pounds since Monday 149-161.5  3.  Are you currently taking a "fluid pill"? yes 4.  Are you currently SOB? yes 5.  Have you traveled recently? no

## 2016-06-18 NOTE — Telephone Encounter (Signed)
Heather from [facility] care calling stating she is out for routine  visit.  Kelsey Cochran is very SOB.  States over the past 12-13 hrs breathing has become more labored. Has gained 13 lbs since Mon.  Has edema in both legs and skin is "weeping". BP 120/72 ; O2 Sats 93 %. Is taking Lasix 80 mg daily.  This was decreased at May visit from Lasix 80 mg BID to daily.  Also had a BMET drawn yesterday when had PT drawn.  Was told to hold Coumadin x 3 days due elevated PT- was 6.84. Spoke w/Dr. Eden Emms who states he will see her today at 3:30.  Spoke w/ daughter and advised her of appointment and to bring her medications with her.  Also advised her of her lab results from yesterday.  Will be here for appointment.

## 2016-06-18 NOTE — Telephone Encounter (Signed)
New message      Pt c/o swelling: STAT is pt has developed SOB within 24 hours  1. How long have you been experiencing swelling? For a while per daughter  2. Where is the swelling located? In legs per daughter open now a weeping  3.  Are you currently taking a "fluid pill"?not provided 4.  Are you currently SOB? yes  5.  Have you traveled recently?no     Pt c/o Shortness Of Breath: STAT if SOB developed within the last 24 hours or pt is noticeably SOB on the phone  1. Are you currently SOB (can you hear that pt is SOB on the phone)? Per daughter yes could not hear the pt  2. How long have you been experiencing SOB? Really bad since yesterday per daughter 3. Are you SOB when sitting or when up moving around? Sitting down  4. Are you currently experiencing any other symptoms?the family is having difficulty getting the PT/INR down to   Daughter feels someone should call her or the pt needs to be seen

## 2016-06-18 NOTE — Progress Notes (Signed)
Patient ID: Kelsey Cochran, female   DOB: [birth year], 82 y.o.   MRN: [id number]   Kelsey Cochran is seen today for F/U of dyspnea, anticoagulaiton and MVR and carotid disease.  . She has significant anemia in past. . I reviewed her echo from 9/15 and EF normal with normal functioning MVR no evidence of perivalvular leak or other abnormality that could contribute to anemia via shearing effect/hemolysis. She apparantly has had Aranasp shots and F/U with Dr Kelsey Cochran but I don't have these notes. Tried on lisinopril for proteinuria but could not tolerate for light headedness. Encouraged her to F/U with Dr Kelsey Cochran to find Cochran dose substitute Recently put on fenofibrate for tryglycerides and INR elevated Following with coumadin clinic to adjust dose and better.   Echo 9/15 with normal appearing MVR but severe pulmonary hypertension  Study Conclusions  - Left ventricle: The cavity size was normal. Systolic function was  normal. The estimated ejection fraction was in the range of 55% to 60%. Wall motion was normal; there were no regional wall motion abnormalities. - Aortic valve: Trileaflet; mildly thickened leaflets. - Mitral valve: A mechanical prosthesis was present and functioning normally. Valve area by continuity equation (using LVOT flow): 1.45 cm^2. - Left atrium: The atrium was mildly dilated. - Tricuspid valve: There was mild-moderate regurgitation. - Pulmonic valve: There was trivial regurgitation. - Pulmonary arteries: PA peak pressure: 85 mm Hg (S).  Impressions:  - The right ventricular systolic pressure was increased consistent with severe pulmonary hypertension.  02/24/16 60-79% RICA stenosis.  F/U carotid duplex in 6 months  08/20/14  Myovue was normal   She was started on Buspar with some lightening of her mood.  Discussed pro's/cons of right heart cath and she prefers not to have it again She is on beta blocker to maximize diastolic filling period, diuretic to lower filling  pressures  And imdur as vasodilator as she cannot afford Any of the specific pulmonary vasodilators  With MVR and previous TIA she needs lovenox bridging   Increased LE edema and weight gain with dyspnea last week Seen by home  Health. She had her lasix cut back in May due to azotemia and aldactone  Stopped due to elevated K and elevated Cr  Lab Results  Component Value Date   CREATININE 1.67 (H) 06/17/2016   Lab Results  Component Value Date   HCT 35.5 (L) 06/03/2016   Had an isolated episode of SSCP lasting seconds Does not really want to continue carotid surveillance   ROS: Denies fever, malais, weight loss, blurry vision, decreased visual acuity, cough, sputum, SOB, hemoptysis, pleuritic pain, palpitaitons, heartburn, abdominal pain, melena, lower extremity edema, claudication, or rash.  All other systems reviewed and negative  General: Still some depressed  Healthy:  appears stated age HEENT: normal Neck supple with no adenopathy JVP normal no bruits no thyromegaly Lungs clear with no wheezing and good diaphragmatic motion Heart:  S1 click /S2 no murmur, no rub, gallop or click PMI normal Abdomen: benighn, BS positve, no tenderness, no AAA no bruit.  No HSM or HJR Distal pulses intact with no bruits Plus 2 edema Neuro non-focal Skin warm and dry No muscular weakness   Current Outpatient Prescriptions  Medication Sig Dispense Refill  . albuterol (PROVENTIL) (2.5 MG/3ML) 0.083% nebulizer solution Take 3 mLs (2.5 mg total) by nebulization every 6 (six) hours as needed for wheezing or shortness of breath. 75 mL 12  . alendronate (FOSAMAX) 35 MG tablet TAKE 1 TABLET EVERY 7 DAYS  IN THE MORNING AS DIRECTED. SEE PACKAGE FOR ADDITIONAL INSTRUCTIONS 12 tablet 0  . aspirin 81 MG tablet Take 81 mg by mouth daily.      Marland Kitchen atorvastatin (LIPITOR) 20 MG tablet TAKE 1 TABLET EVERY DAY 90 tablet 1  . busPIRone (BUSPAR) 7.5 MG tablet TAKE 1 TABLET BY MOUTH TWICE DAILY 60 tablet 6  .  carvedilol (COREG) 25 MG tablet Take 0.5 tablets (12.5 mg total) by mouth daily. 45 tablet 3  . Cholecalciferol (VITAMIN D3) 5000 UNITS CAPS Take 1 capsule by mouth daily. Reported on 12/25/2015    . Diphenhydramine-APAP, sleep, (TYLENOL PM EXTRA STRENGTH PO) Take 1 tablet by mouth daily as needed (for sleep). As needed    . docusate sodium (COLACE) 100 MG capsule Take 100 mg by mouth 2 (two) times daily.      Marland Kitchen enoxaparin (LOVENOX) 80 MG/0.8ML injection Inject 0.8 mLs (80 mg total) into the skin daily. 10 Syringe 1  . fenofibrate 160 MG tablet TAKE 1 TABLET EVERY DAY 90 tablet 1  . folic acid (FOLVITE) 800 MCG tablet Take 400 mcg by mouth daily.      . furosemide (LASIX) 80 MG tablet Take 1 tablet (80 mg total) by mouth daily. 180 tablet 3  . glipiZIDE (GLUCOTROL XL) 2.5 MG 24 hr tablet TAKE 1 TABLET BY MOUTH DAILY 30 tablet 5  . isosorbide mononitrate (IMDUR) 30 MG 24 hr tablet Take 30 mg by mouth daily.    . methocarbamol (ROBAXIN) 500 MG tablet Take 500 mg by mouth 2 (two) times daily as needed for muscle spasms.    . nitroGLYCERIN (NITROSTAT) 0.4 MG SL tablet Place 1 tablet (0.4 mg total) under the tongue every 5 (five) minutes as needed for chest pain. 25 tablet 3  . oxyCODONE-acetaminophen (ROXICET) 5-325 MG tablet Take 1 tablet by mouth every 8 (eight) hours as needed for severe pain. 45 tablet 0  . sertraline (ZOLOFT) 50 MG tablet TAKE 1 TABLET(50 MG) BY MOUTH DAILY 30 tablet 3  . triamcinolone cream (KENALOG) 0.1 % Apply 1 application topically 2 (two) times daily. 30 g 0  . warfarin (COUMADIN) 2.5 MG tablet TAKE AS DIRECTED 35 tablet 3   No current facility-administered medications for this visit.     Allergies  Meperidine hcl; Sulfamethoxazole-trimethoprim; and Heparin  Electrocardiogram:  afib rate 76  Poor R wave progression RAD  09/17/15  afib rate 65 RVH no change   Assessment and Plan MVR:  Normal exam and echo continue beta blocker and anticoagulation Depression:   Significant on buspar and celexa CHF:  See [facility] for hospital admission for iv lasix and CHF consult start revatio or adcirca in place of isordil  Consider right heart cath  As she will have more symptomatic edema and dyspnea without it  Anticoagulation: hold coumadin as INR has been high due to liver congestion  Chol:  On statin   Cholesterol is at goal.  Continue current dose of statin and diet Rx.  No myalgias or side effects.  F/U  LFT's in 6 months. Lab Results  Component Value Date   LDLCALC 52 12/04/2015   CRF:  Tolerate more azotemia to Rx pulmonary HTN and edema         Afib: good rate control and anticogulation needs lovenox bridging  Carotid:  60-79% RICA stenosis.  Will discuss yearly [place] at time since she does not want to continue to follow Chest Pain: isolated self limited episode SL nitro called in observe  Jenkins Rouge

## 2016-06-18 NOTE — H&P (Signed)
82 y.o. with failure to thrive, severe pulmonary HTN, MVR , LE edema Seen in office today and admitted for volume overload..   Echo 9/15 with normal appearing MVR but severe pulmonary hypertension  Study Conclusions  - Left ventricle: The cavity size was normal. Systolic function was  normal. The estimated ejection fraction was in the range of 55% to 60%. Wall motion was normal; there were no regional wall motion abnormalities. - Aortic valve: Trileaflet; mildly thickened leaflets. - Mitral valve: A mechanical prosthesis was present and functioning normally. Valve area by continuity equation (using LVOT flow): 1.45 cm^2. - Left atrium: The atrium was mildly dilated. - Tricuspid valve: There was mild-moderate regurgitation. - Pulmonic valve: There was trivial regurgitation. - Pulmonary arteries: PA peak pressure: 85 mm Hg (S).  Impressions:  - The right ventricular systolic pressure was increased consistent with severe pulmonary hypertension.  02/24/16 60-79% RICA stenosis.  F/U carotid duplex in 6 months  08/20/14  Myovue was normal   She was started on Buspar with some lightening of her mood.  Discussed pro's/cons of right heart cath and she prefers not to have it again She is on beta blocker to maximize diastolic filling period, diuretic to lower filling pressures  And imdur as vasodilator as she cannot afford Any of the specific pulmonary vasodilators  With MVR and previous TIA she needs lovenox bridging   Increased LE edema and weight gain with dyspnea last week Seen by home  Health. She had her lasix cut back in May due to azotemia and aldactone  Stopped due to elevated K and elevated Cr  Her daughters are with her today. They indicate mom has gained 15 lbs this week and legs weeping more. No fever Abdomen distended. No nausea vomiting Compliant with meds. Has had home health looking at legs Has large echymosis on back from ? fall  Recent Labs     Lab Results  Component Value Date   CREATININE 1.67 (H) 06/17/2016     Recent Labs  Lab Results  Component Value Date   HCT 35.5 (L) 06/03/2016     Had an isolated episode of SSCP lasting seconds Does not really want to continue carotid surveillance   ROS: Denies fever, malais, weight loss, blurry vision, decreased visual acuity, cough, sputum, SOB, hemoptysis, pleuritic pain, palpitaitons, heartburn, abdominal pain, melena, lower extremity edema, claudication, or rash.  All other systems reviewed and negative  General: Still some depressed  Healthy:  appears stated age HEENT: normal Neck supple with no adenopathy JVP normal no bruits no thyromegaly Lungs clear with no wheezing and good diaphragmatic motion Heart:  S1 click /S2 no murmur, no rub, gallop or click PMI normal Abdomen: benighn, BS positve, no tenderness, no AAA no bruit.  No HSM or HJR Distal pulses intact with no bruits Plus 3 edema with erythema and some weeping  Neuro non-focal Skin warm and dry large echymosis on back  No muscular weakness         Current Outpatient Prescriptions  Medication Sig Dispense Refill  . albuterol (PROVENTIL) (2.5 MG/3ML) 0.083% nebulizer solution Take 3 mLs (2.5 mg total) by nebulization every 6 (six) hours as needed for wheezing or shortness of breath. 75 mL 12  . alendronate (FOSAMAX) 35 MG tablet TAKE 1 TABLET EVERY 7 DAYS IN THE MORNING AS DIRECTED. SEE PACKAGE FOR ADDITIONAL INSTRUCTIONS 12 tablet 0  . aspirin 81 MG tablet Take 81 mg by mouth daily.      Marland Kitchen atorvastatin (LIPITOR)  20 MG tablet TAKE 1 TABLET EVERY DAY 90 tablet 1  . busPIRone (BUSPAR) 7.5 MG tablet TAKE 1 TABLET BY MOUTH TWICE DAILY 60 tablet 6  . carvedilol (COREG) 25 MG tablet Take 0.5 tablets (12.5 mg total) by mouth daily. 45 tablet 3  . Cholecalciferol (VITAMIN D3) 5000 UNITS CAPS Take 1 capsule by mouth daily. Reported on 12/25/2015    . Diphenhydramine-APAP, sleep, (TYLENOL PM EXTRA  STRENGTH PO) Take 1 tablet by mouth daily as needed (for sleep). As needed    . docusate sodium (COLACE) 100 MG capsule Take 100 mg by mouth 2 (two) times daily.      Marland Kitchen enoxaparin (LOVENOX) 80 MG/0.8ML injection Inject 0.8 mLs (80 mg total) into the skin daily. 10 Syringe 1  . fenofibrate 160 MG tablet TAKE 1 TABLET EVERY DAY 90 tablet 1  . folic acid (FOLVITE) 800 MCG tablet Take 400 mcg by mouth daily.      . furosemide (LASIX) 80 MG tablet Take 1 tablet (80 mg total) by mouth daily. 180 tablet 3  . glipiZIDE (GLUCOTROL XL) 2.5 MG 24 hr tablet TAKE 1 TABLET BY MOUTH DAILY 30 tablet 5  . isosorbide mononitrate (IMDUR) 30 MG 24 hr tablet Take 30 mg by mouth daily.    . methocarbamol (ROBAXIN) 500 MG tablet Take 500 mg by mouth 2 (two) times daily as needed for muscle spasms.    . nitroGLYCERIN (NITROSTAT) 0.4 MG SL tablet Place 1 tablet (0.4 mg total) under the tongue every 5 (five) minutes as needed for chest pain. 25 tablet 3  . oxyCODONE-acetaminophen (ROXICET) 5-325 MG tablet Take 1 tablet by mouth every 8 (eight) hours as needed for severe pain. 45 tablet 0  . sertraline (ZOLOFT) 50 MG tablet TAKE 1 TABLET(50 MG) BY MOUTH DAILY 30 tablet 3  . triamcinolone cream (KENALOG) 0.1 % Apply 1 application topically 2 (two) times daily. 30 g 0  . warfarin (COUMADIN) 2.5 MG tablet TAKE AS DIRECTED 35 tablet 3   No current facility-administered medications for this visit.     Allergies  Meperidine hcl; Sulfamethoxazole-trimethoprim; and Heparin  Electrocardiogram:  afib rate 76  Poor R wave progression RAD  09/17/15  afib rate 65 RVH no change   Assessment and Plan Elderly female with failure to thrive admitted with volume overload. Post MVR with elevated INR due to liver congestion .   Normal functioning MVR but likely residual pulmonary venous HTN. Last estimated PA pressure 85. Patient declined Repeat right heart cath. Discussed with Dr Shirlee Latch. Would have CHF team round on  weekend.  Rx Lasix 40 iv bid Tolerate more prerenal azotemia with Cr as high as 2.5.  CHF clinic may have better luck getting Adcirca or Revatio Approved.  Will need wound care nurse to see during hospitalization and family requiests Hospice consult which I think is appropriate    Kelsey Cochran

## 2016-06-18 NOTE — Progress Notes (Signed)
ANTICOAGULATION CONSULT NOTE - Initial Consult  Pharmacy Consult for warfarin Indication: atrial fibrillation  Allergies  Allergen Reactions  . Meperidine Hcl Nausea And Vomiting  . Sulfamethoxazole-Trimethoprim Rash    REACTION: Sulfa Allergy  . Heparin Other (See Comments)    Pt is unsure about the reaction  . Mushroom Extract Complex Itching and Other (See Comments)    Eyes water    Patient Measurements: Height: 5\' 2"  (157.5 cm) Weight: 160 lb 6.4 oz (72.8 kg) IBW/kg (Calculated) : 50.1   Vital Signs: Temp: 98 F (36.7 C) (08/04 1653) Temp Source: Oral (08/04 1653) BP: 130/63 (08/04 1653) Pulse Rate: 78 (08/04 1653)  Labs:  Recent Labs  06/17/16 1038 06/17/16 1053 06/17/16 1128  LABPROT  --   --  61.5*  INR  --  >8.0 6.84*  CREATININE 1.67*  --   --     Estimated Creatinine Clearance: 23.9 mL/min (by C-G formula based on SCr of 1.67 mg/dL).   Medical History: Past Medical History:  Diagnosis Date  . Anemia   . Arthritis   . Atrial fibrillation (HCC)   . CRF (chronic renal failure)   . Diabetes mellitus   . Diastolic CHF, chronic (HCC)   . Hypercholesterolemia   . Hypertension   . Pulmonary hypertension (HCC)   . S/P MVR (mitral valve replacement) 2005   [facility] 2005  [facility]  . S/P tricuspid valve repair 2005   [facility]  . TIA (transient ischemic attack)   . TIA (transient ischemic attack)     Assessment: 82 y.o. with failure to thrive, severe pulmonary HTN, MVR , LE edema as outpatient today and now being admitted for volume overload. Per family she is up 15 pounds.  INR was elevated at 6.8 yesterday likely d/t liver congestion. Patent was told to hold her warfarin for 3 days, recheck shows its still above 5 today. Will follow daily while admitted.  Clinic dose documented 8/3 was 2.5mg  daily except 1.25 on T/TR/Sat  Goal of Therapy:  INR goal 2.5-3.5 Monitor platelets by anticoagulation protocol: Yes   Plan:  Daily INR Hold  warfarin  Sheppard Coil PharmD., BCPS Clinical Pharmacist Pager [phone] 06/18/2016 6:01 PM

## 2016-06-18 NOTE — Patient Instructions (Signed)
Admitted patient to hospital.

## 2016-06-19 ENCOUNTER — Inpatient Hospital Stay (HOSPITAL_COMMUNITY): Payer: Medicare PPO

## 2016-06-19 DIAGNOSIS — I6523 Occlusion and stenosis of bilateral carotid arteries: Secondary | ICD-10-CM

## 2016-06-19 DIAGNOSIS — Z954 Presence of other heart-valve replacement: Secondary | ICD-10-CM

## 2016-06-19 DIAGNOSIS — I509 Heart failure, unspecified: Secondary | ICD-10-CM

## 2016-06-19 DIAGNOSIS — E8770 Fluid overload, unspecified: Secondary | ICD-10-CM

## 2016-06-19 DIAGNOSIS — Z515 Encounter for palliative care: Secondary | ICD-10-CM

## 2016-06-19 DIAGNOSIS — I5033 Acute on chronic diastolic (congestive) heart failure: Secondary | ICD-10-CM

## 2016-06-19 DIAGNOSIS — I272 Other secondary pulmonary hypertension: Secondary | ICD-10-CM

## 2016-06-19 LAB — BASIC METABOLIC PANEL
Anion gap: 11 (ref 5–15)
BUN: 33 mg/dL — ABNORMAL HIGH (ref 6–20)
CALCIUM: 8.7 mg/dL — AB (ref 8.9–10.3)
CO2: 27 mmol/L (ref 22–32)
CREATININE: 1.56 mg/dL — AB (ref 0.44–1.00)
Chloride: 102 mmol/L (ref 101–111)
GFR calc non Af Amer: 30 mL/min — ABNORMAL LOW (ref >60)
GFR, EST AFRICAN AMERICAN: 34 mL/min — AB (ref >60)
Glucose, Bld: 73 mg/dL (ref 65–99)
Potassium: 3.8 mmol/L (ref 3.5–5.1)
SODIUM: 140 mmol/L (ref 135–145)

## 2016-06-19 LAB — PROTIME-INR
INR: 5
Prothrombin Time: 47.9 seconds — ABNORMAL HIGH (ref 11.4–15.2)

## 2016-06-19 LAB — GLUCOSE, CAPILLARY
GLUCOSE-CAPILLARY: 69 mg/dL (ref 65–99)
GLUCOSE-CAPILLARY: 118 mg/dL — AB (ref 65–99)
Glucose-Capillary: 122 mg/dL — ABNORMAL HIGH (ref 65–99)
Glucose-Capillary: 107 mg/dL — ABNORMAL HIGH (ref 65–99)
Glucose-Capillary: 213 mg/dL — ABNORMAL HIGH (ref 65–99)

## 2016-06-19 LAB — ECHOCARDIOGRAM COMPLETE
HEIGHTINCHES: 62 in
WEIGHTICAEL: 2569.6 [oz_av]

## 2016-06-19 NOTE — Progress Notes (Signed)
  Echocardiogram 2D Echocardiogram has been performed.  Marisue Humble 06/19/2016, 2:38 PM

## 2016-06-19 NOTE — Consult Note (Signed)
Consultation Note Date: 06/19/2016   Patient Name: Kelsey Cochran  DOB: 26-Feb-1930  MRN: [id number]  Age / Sex: 82 y.o., female  PCP: Sheliah Hatch, MD Referring Physician: Wendall Stade, MD  Reason for Consultation: Establishing goals of care and Psychosocial/spiritual support  HPI/Patient Profile: 82 y.o. female  with past medical history of Mitral valve replacement on Coumadin, severe pulmonary hypertension, lower extremity edema, anxiety, depression, hyperlipidemia, back pain, falls admitted on 06/18/2016 with increased INR of 6.84, volume overload, elevated creatinine and shortness of breath. Patient has been living with heart failure for approximately 10 years. Her daughter shares that over the past 6 months she has seen several changes in her behavior: Falls, decreased mobility (using her scooter more rather than her walker), decrease in appetite and more depressed. Clinical Assessment and Goals of Care: Spoke with daughter Duanne Guess, as well as her husband, and patient. Patient is a retired Estate agent. Daughter as well as patient readily state that she has never wanted to be put on "life support". Patient also has 6 children and is a widow.  Patient cancer currently speak for herself but in the event that she were unable to do this she has not designated a healthcare power of attorney. She has 6 children.    SUMMARY OF RECOMMENDATIONS   Continue full code until family meeting on 06/20/2016 at 2 PM. Patient readily stated that she wanted to be a DO NOT RESUSCITATE but wants to hold off on changing her CODE STATUS until she includes her children in discussion Recommend during our meeting tomorrow that we designate a healthcare power of attorney which she has agreed to She would not want a feeding tube She is still stating she would not want another heart catheter Code Status/Advance Care  Planning:  Full code    Symptom Management:   Pain: Patient recently fell and landed on her coccyx. Continue Percocet 1 tab every 8 hours as needed  Dyspnea: Continue targeted pulmonary treatments, O2 via nasal cannula, Lasix. Educated patient and family on role of opioids to treat shortness of breath in end-stage disease  Palliative Prophylaxis:   Bowel Regimen, Frequent Pain Assessment, Oral Care and Turn Reposition  Additional Recommendations (Limitations, Scope, Preferences):  No Artificial Feeding, No Chemotherapy, No Hemodialysis and No Radiation  Psycho-social/Spiritual:   Desire for further Chaplaincy support:no  Prognosis:   < 6 months in the setting of combined congestive heart failure, severe pulmonary hypertension  Discharge Planning: Patient will return to her home. We will introduce the idea of returning home with the support of hospice in meeting scheduled for 06/20/2016 at 2 PM      Primary Diagnoses: Present on Admission: . Acute on chronic diastolic (congestive) heart failure (HCC)   I have reviewed the medical record, interviewed the patient and family, and examined the patient. The following aspects are pertinent.  Past Medical History:  Diagnosis Date  . Anemia   . Arthritis   . Atrial fibrillation (HCC)   . CRF (chronic renal  failure)   . Diabetes mellitus   . Diastolic CHF, chronic (HCC)   . Hypercholesterolemia   . Hypertension   . Pulmonary hypertension (HCC)   . S/P MVR (mitral valve replacement) 2005   [facility] 2005  [facility]  . S/P tricuspid valve repair 2005   [facility]  . TIA (transient ischemic attack)   . TIA (transient ischemic attack)    Social History   Social History  . Marital status: Widowed    Spouse name: N/A  . Number of children: N/A  . Years of education: N/A   Occupational History  . Retired Retired    Charity fundraiser   Social History Main Topics  . Smoking status: Never Smoker  . Smokeless tobacco: Never Used   . Alcohol use No  . Drug use: No  . Sexual activity: Not on file   Other Topics Concern  . Not on file   Social History Narrative  . No narrative on file   Family History  Problem Relation Age of Onset  . Asthma Daughter   . Allergies Daughter   . Allergies Daughter   . Cancer Sister     bladder   Scheduled Meds: . atorvastatin  20 mg Oral Daily  . busPIRone  7.5 mg Oral BID  . carvedilol  12.5 mg Oral Daily  . docusate sodium  100 mg Oral BID  . fenofibrate  160 mg Oral Daily  . furosemide  40 mg Intravenous BID  . insulin aspart  0-15 Units Subcutaneous TID WC  . isosorbide mononitrate  30 mg Oral Daily  . sertraline  50 mg Oral QHS  . sodium chloride flush  3 mL Intravenous Q12H   Continuous Infusions:  PRN Meds:.sodium chloride, acetaminophen, albuterol, diphenhydrAMINE, ondansetron (ZOFRAN) IV, oxyCODONE-acetaminophen, sodium chloride flush Medications Prior to Admission:  Prior to Admission medications   Medication Sig Start Date End Date Taking? Authorizing Provider  alendronate (FOSAMAX) 35 MG tablet TAKE 1 TABLET EVERY 7 DAYS IN THE MORNING AS DIRECTED. SEE PACKAGE FOR ADDITIONAL INSTRUCTIONS 04/21/16  Yes Sheliah Hatch, MD  aspirin 81 MG tablet Take 81 mg by mouth daily.     Yes Historical Provider, MD  atorvastatin (LIPITOR) 20 MG tablet TAKE 1 TABLET EVERY DAY 06/16/16  Yes Sheliah Hatch, MD  busPIRone (BUSPAR) 7.5 MG tablet TAKE 1 TABLET BY MOUTH TWICE DAILY 12/31/15  Yes Sheliah Hatch, MD  carvedilol (COREG) 25 MG tablet Take 0.5 tablets (12.5 mg total) by mouth daily. 10/23/15  Yes Wendall Stade, MD  Cholecalciferol (VITAMIN D3) 5000 UNITS CAPS Take 1 capsule by mouth daily. Reported on 12/25/2015   Yes Historical Provider, MD  Diphenhydramine-APAP, sleep, (TYLENOL PM EXTRA STRENGTH PO) Take 1 tablet by mouth daily as needed (for sleep). As needed   Yes Historical Provider, MD  docusate sodium (COLACE) 100 MG capsule Take 100 mg by mouth 2 (two)  times daily.     Yes Historical Provider, MD  Doxylamine Succinate, Sleep, (SLEEP AID PO) Take 1 tablet by mouth as needed (for sleep).   Yes Historical Provider, MD  fenofibrate 160 MG tablet TAKE 1 TABLET EVERY DAY 06/16/16  Yes Sheliah Hatch, MD  folic acid (FOLVITE) 800 MCG tablet Take 400 mcg by mouth daily.     Yes Historical Provider, MD  furosemide (LASIX) 80 MG tablet Take 1 tablet (80 mg total) by mouth daily. 04/27/16  Yes Wendall Stade, MD  glipiZIDE (GLUCOTROL XL) 2.5 MG 24  hr tablet TAKE 1 TABLET BY MOUTH DAILY 02/26/16  Yes Sheliah Hatch, MD  isosorbide mononitrate (IMDUR) 30 MG 24 hr tablet Take 30 mg by mouth daily.   Yes Historical Provider, MD  methocarbamol (ROBAXIN) 500 MG tablet Take 500 mg by mouth 2 (two) times daily as needed for muscle spasms.   Yes Historical Provider, MD  oxyCODONE-acetaminophen (ROXICET) 5-325 MG tablet Take 1 tablet by mouth every 8 (eight) hours as needed for severe pain. Patient taking differently: Take 0.5-1 tablets by mouth every 8 (eight) hours as needed for severe pain.  05/28/16  Yes Sheliah Hatch, MD  sertraline (ZOLOFT) 50 MG tablet TAKE 1 TABLET(50 MG) BY MOUTH DAILY 04/26/16  Yes Sheliah Hatch, MD  warfarin (COUMADIN) 2.5 MG tablet TAKE AS DIRECTED Patient taking differently: Take 2.5 mg by mouth daily except tuesday, thursday and saturday take 1.25 mg by mouth daily. 02/26/16  Yes Wendall Stade, MD  albuterol (PROVENTIL) (2.5 MG/3ML) 0.083% nebulizer solution Take 3 mLs (2.5 mg total) by nebulization every 6 (six) hours as needed for wheezing or shortness of breath. 12/04/15   Sheliah Hatch, MD  nitroGLYCERIN (NITROSTAT) 0.4 MG SL tablet Place 1 tablet (0.4 mg total) under the tongue every 5 (five) minutes as needed for chest pain. 03/26/16   Wendall Stade, MD  triamcinolone cream (KENALOG) 0.1 % Apply 1 application topically 2 (two) times daily. Patient not taking: Reported on 06/18/2016 04/11/15   Sheliah Hatch, MD    Allergies  Allergen Reactions  . Meperidine Hcl Nausea And Vomiting  . Sulfamethoxazole-Trimethoprim Rash    REACTION: Sulfa Allergy  . Heparin Other (See Comments)    Pt is unsure about the reaction  . Mushroom Extract Complex Itching and Other (See Comments)    Eyes water   Review of Systems  Constitutional: Positive for activity change, appetite change, fatigue and unexpected weight change.  HENT: Negative.   Eyes: Negative.   Respiratory: Positive for shortness of breath.   Cardiovascular: Positive for palpitations and leg swelling.  Gastrointestinal: Positive for abdominal distention.  Endocrine: Negative.   Genitourinary: Negative.   Musculoskeletal: Positive for back pain.  Allergic/Immunologic: Negative.   Neurological: Positive for dizziness, weakness and light-headedness.  Hematological: Negative.   Psychiatric/Behavioral: Positive for decreased concentration. The patient is nervous/anxious.     Physical Exam  Constitutional: She is oriented to person, place, and time. She appears well-developed and well-nourished.  Elderly female, pale  Neck: Normal range of motion.  Cardiovascular: Normal rate.   Pulmonary/Chest:  Mild increased work of breathing at rest and with conversation  Abdominal: Soft.  Neurological: She is alert and oriented to person, place, and time.  Skin: Skin is warm and dry.  Psychiatric: She has a normal mood and affect. Her behavior is normal. Judgment and thought content normal.  Nursing note and vitals reviewed.   Vital Signs: BP (!) 103/51 (BP Location: Right Arm)   Pulse 68   Temp 98.2 F (36.8 C) (Oral)   Resp 18   Ht 5\' 2"  (1.575 m)   Wt 72.8 kg (160 lb 9.6 oz)   SpO2 98%   BMI 29.37 kg/m  Pain Assessment: No/denies pain   Pain Score: 0-No pain   SpO2: SpO2: 98 % O2 Device:SpO2: 98 % O2 Flow Rate: .   IO: Intake/output summary:  Intake/Output Summary (Last 24 hours) at 06/19/16 1826 Last data filed at 06/19/16 1300   Gross per 24 hour  Intake  360 ml  Output              850 ml  Net             -490 ml    LBM: Last BM Date: 06/17/16 Baseline Weight: Weight: 72.8 kg (160 lb 6.4 oz) Most recent weight: Weight: 72.8 kg (160 lb 9.6 oz)     Palliative Assessment/Data:   Flowsheet Rows   Flowsheet Row Most Recent Value  Intake Tab  Referral Department  Cardiology  Unit at Time of Referral  Med/Surg Unit  Palliative Care Primary Diagnosis  Cardiac  Date Notified  06/18/16  Palliative Care Type  New Palliative care  Reason for referral  Clarify Goals of Care  Date of Admission  06/18/16  Date first seen by Palliative Care  06/19/16  # of days Palliative referral response time  1 Day(s)  # of days IP prior to Palliative referral  0  Clinical Assessment  Palliative Performance Scale Score  50%  Pain Max last 24 hours  0  Pain Min Last 24 hours  0  Dyspnea Max Last 24 Hours  6  Dyspnea Min Last 24 hours  3  Nausea Max Last 24 Hours  0  Nausea Min Last 24 Hours  0  Anxiety Max Last 24 Hours  0  Anxiety Min Last 24 Hours  0  Psychosocial & Spiritual Assessment  Palliative Care Outcomes  Patient/Family meeting held?  Yes  Who was at the meeting?  1 daughter and SIL,  plan to meet other 5 children 06/20/16  Patient/Family wishes: Interventions discontinued/not started   PEG, Tube feedings/TPN, Hemodialysis, Trach  Palliative Care follow-up planned  Yes, Facility      Time In: 1400 Time Out: 1530 Time Total: 90 min Greater than 50%  of this time was spent counseling and coordinating care related to the above assessment and plan.  Signed by: Irean Hong, NP   Please contact Palliative Medicine Team phone at [phone] for questions and concerns.  For individual provider: See Loretha Stapler

## 2016-06-19 NOTE — Progress Notes (Signed)
ANTICOAGULATION CONSULT NOTE - Follow Up Consult  Pharmacy Consult for Coumadin Indication: MVR and atrial fibrillation  Allergies  Allergen Reactions  . Meperidine Hcl Nausea And Vomiting  . Sulfamethoxazole-Trimethoprim Rash    REACTION: Sulfa Allergy  . Heparin Other (See Comments)    Pt is unsure about the reaction  . Mushroom Extract Complex Itching and Other (See Comments)    Eyes water    Patient Measurements: Height: 5\' 2"  (157.5 cm) Weight: 160 lb 9.6 oz (72.8 kg) IBW/kg (Calculated) : 50.1  Vital Signs: Temp: 98.2 F (36.8 C) (08/05 1204) Temp Source: Oral (08/05 1204) BP: 103/51 (08/05 1204) Pulse Rate: 68 (08/05 1204)  Labs:  Recent Labs  06/17/16 1038  06/17/16 1128 06/18/16 1853 06/19/16 0208  LABPROT  --   --  61.5* 50.2* 47.9*  INR  --   < > 6.84* 5.31* 5.00*  CREATININE 1.67*  --   --   --  1.56*  < > = values in this interval not displayed.  Estimated Creatinine Clearance: 25.5 mL/min (by C-G formula based on SCr of 1.56 mg/dL).  Assessment: 85yof on coumadin for MVR and afib. INR 6.84 on 8/3 and patient was told to hold her coumadin x 3 days. INR trended down to 5.31 yesterday and 5.0 today. No bleeding reported.  Home dose: 2.5mg  daily except 1.25mg  on T/TR/Sat  Goal of Therapy:  INR 2.5-3.5 Monitor platelets by anticoagulation protocol: Yes   Plan:  1) Hold coumadin 2) Daily INR  Fredrik Rigger 06/19/2016,12:14 PM

## 2016-06-19 NOTE — Progress Notes (Signed)
SUBJECTIVE: Pt still dyspneic with minimal exertion. Has yet to see palliative care. Family members had several questions regarding pulm HTN meds as well as advanced HF service. Denies chest pain.   ROS: Other than pertinent positives in "Subjective", all others were reviewed and found to be negative.   Intake/Output Summary (Last 24 hours) at 06/19/16 1135 Last data filed at 06/19/16 0749  Gross per 24 hour  Intake              360 ml  Output             1000 ml  Net             -640 ml    Current Facility-Administered Medications  Medication Dose Route Frequency Provider Last Rate Last Dose  . 0.9 %  sodium chloride infusion  250 mL Intravenous PRN Ellsworth Lennox, [place]      . acetaminophen (TYLENOL) tablet 650 mg  650 mg Oral Q4H PRN Ellsworth Lennox, PA   650 mg at 06/19/16 1035  . albuterol (PROVENTIL) (2.5 MG/3ML) 0.083% nebulizer solution 2.5 mg  2.5 mg Nebulization Q6H PRN Ellsworth Lennox, PA      . atorvastatin (LIPITOR) tablet 20 mg  20 mg Oral Daily Ellsworth Lennox, [place]   20 mg at 06/19/16 1035  . busPIRone (BUSPAR) tablet 7.5 mg  7.5 mg Oral BID Ellsworth Lennox, PA   7.5 mg at 06/19/16 1035  . carvedilol (COREG) tablet 12.5 mg  12.5 mg Oral Daily Ellsworth Lennox, [place]   12.5 mg at 06/19/16 1035  . diphenhydrAMINE (BENADRYL) capsule 25 mg  25 mg Oral QHS PRN Ellsworth Lennox, PA   25 mg at 06/18/16 2213  . docusate sodium (COLACE) capsule 100 mg  100 mg Oral BID Ellsworth Lennox, PA   100 mg at 06/19/16 1035  . fenofibrate tablet 160 mg  160 mg Oral Daily Ellsworth Lennox, [place]   160 mg at 06/19/16 1035  . furosemide (LASIX) injection 40 mg  40 mg Intravenous BID Ellsworth Lennox, PA   40 mg at 06/19/16 0930  . insulin aspart (novoLOG) injection 0-15 Units  0-15 Units Subcutaneous TID WC Ellsworth Lennox, [place]      . isosorbide mononitrate (IMDUR) 24 hr tablet 30 mg  30 mg Oral Daily Ellsworth Lennox, [place]   30 mg at 06/19/16 1035  . ondansetron  (ZOFRAN) injection 4 mg  4 mg Intravenous Q6H PRN Ellsworth Lennox, [place]      . oxyCODONE-acetaminophen (PERCOCET/ROXICET) 5-325 MG per tablet 1 tablet  1 tablet Oral Q8H PRN Ellsworth Lennox, [place]   1 tablet at 06/18/16 1958  . sertraline (ZOLOFT) tablet 50 mg  50 mg Oral QHS Ellsworth Lennox, [place]   50 mg at 06/18/16 2213  . sodium chloride flush (NS) 0.9 % injection 3 mL  3 mL Intravenous Q12H Ellsworth Lennox, PA   3 mL at 06/19/16 1036  . sodium chloride flush (NS) 0.9 % injection 3 mL  3 mL Intravenous PRN Ellsworth Lennox, [place]        Vitals:   06/18/16 1653 06/18/16 2000 06/19/16 0156 06/19/16 0424  BP: 130/63 (!) 100/51 (!) 100/53 104/78  Pulse: 78 72 71 72  Resp: 18     Temp: 98 F (36.7 C) 98.2 F (36.8 C) 98.6 F (37 C) 98.4 F (36.9 C)  TempSrc: Oral Oral Oral  Oral  SpO2: 100% 96% 94% 93%  Weight: 160 lb 6.4 oz (72.8 kg)   160 lb 9.6 oz (72.8 kg)  Height: 5\' 2"  (1.575 m)       PHYSICAL EXAM General: NAD HEENT: Normal. Neck: No JVD, no thyromegaly.  Lungs: Clear to auscultation bilaterally with normal respiratory effort. CV: Nondisplaced PMI.  Regular rate and rhythm, S1 click/S2, no S3/S4, no murmur.  2-3+ pretibial edema. Abdomen: Soft, nontender.  Neurologic: Alert and oriented.  Psych: Depressed, flat affect.   LABS: Basic Metabolic Panel:  Recent Labs  59/97/74 1038 06/19/16 0208  NA 140 140  K 4.3 3.8  CL 102 102  CO2 24 27  GLUCOSE 101* 73  BUN 41* 33*  CREATININE 1.67* 1.56*  CALCIUM 8.5* 8.7*   Liver Function Tests: No results for input(s): AST, ALT, ALKPHOS, BILITOT, PROT, ALBUMIN in the last 72 hours. No results for input(s): LIPASE, AMYLASE in the last 72 hours. CBC: No results for input(s): WBC, NEUTROABS, HGB, HCT, MCV, PLT in the last 72 hours. Cardiac Enzymes: No results for input(s): CKTOTAL, CKMB, CKMBINDEX, TROPONINI in the last 72 hours. BNP: Invalid input(s): POCBNP D-Dimer: No results for input(s): DDIMER in the last 72  hours. Hemoglobin A1C: No results for input(s): HGBA1C in the last 72 hours. Fasting Lipid Panel: No results for input(s): CHOL, HDL, LDLCALC, TRIG, CHOLHDL, LDLDIRECT in the last 72 hours. Thyroid Function Tests: No results for input(s): TSH, T4TOTAL, T3FREE, THYROIDAB in the last 72 hours.  Invalid input(s): FREET3 Anemia Panel: No results for input(s): VITAMINB12, FOLATE, FERRITIN, TIBC, IRON, RETICCTPCT in the last 72 hours.  RADIOLOGY: No results found.    ASSESSMENT AND PLAN: 1. Severe pulm HTN with volume overload: Has diuresed 640 cc in last 24 hrs on IV Lasix 40 mg bid. BUN/SCr elevated but stable. For now, will not adjust. Dr. Gala Romney to see tomorrow to discuss pulm HTN meds. However, palliative care consult has also been initiated by Dr. Eden Emms. Conitnue Coreg and Imdur at present doses. BP low normal.  2. Mechanical MVR: INR supratherapeutic. Warfarin on hold. Being managed by pharmacy.  3. Carotid disease: She apparently wants to avoid future surveillance as per notes.    Prentice Docker, M.D., F.A.C.C.

## 2016-06-19 NOTE — Progress Notes (Signed)
Hypoglycemic Event  CBG: 69  Treatment: 15 GM carbohydrate snack  Symptoms: None  Follow-up CBG: Time:0630 CBG Result:118  Possible Reasons for Event: Inadequate meal intake  Comments/MD notified:protocol    Kelsey Cochran L

## 2016-06-20 ENCOUNTER — Encounter (HOSPITAL_COMMUNITY): Payer: Self-pay

## 2016-06-20 ENCOUNTER — Other Ambulatory Visit: Payer: Self-pay | Admitting: Family Medicine

## 2016-06-20 LAB — BASIC METABOLIC PANEL
Anion gap: 13 (ref 5–15)
BUN: 32 mg/dL — ABNORMAL HIGH (ref 6–20)
CALCIUM: 8.3 mg/dL — AB (ref 8.9–10.3)
CO2: 26 mmol/L (ref 22–32)
CREATININE: 1.6 mg/dL — AB (ref 0.44–1.00)
Chloride: 100 mmol/L — ABNORMAL LOW (ref 101–111)
GFR calc Af Amer: 33 mL/min — ABNORMAL LOW (ref >60)
GFR, EST NON AFRICAN AMERICAN: 29 mL/min — AB (ref >60)
GLUCOSE: 99 mg/dL (ref 65–99)
Potassium: 4 mmol/L (ref 3.5–5.1)
Sodium: 139 mmol/L (ref 135–145)

## 2016-06-20 LAB — PROTIME-INR
INR: 3.41
Prothrombin Time: 35.2 seconds — ABNORMAL HIGH (ref 11.4–15.2)

## 2016-06-20 LAB — GLUCOSE, CAPILLARY
GLUCOSE-CAPILLARY: 122 mg/dL — AB (ref 65–99)
Glucose-Capillary: 121 mg/dL — ABNORMAL HIGH (ref 65–99)
Glucose-Capillary: 148 mg/dL — ABNORMAL HIGH (ref 65–99)
Glucose-Capillary: 167 mg/dL — ABNORMAL HIGH (ref 65–99)

## 2016-06-20 MED ORDER — WARFARIN - PHARMACIST DOSING INPATIENT
Freq: Every day | Status: DC
  Administered 2016-06-20 – 2016-06-21 (×2)

## 2016-06-20 MED ORDER — CARVEDILOL 6.25 MG PO TABS
6.2500 mg | ORAL_TABLET | Freq: Every day | ORAL | Status: DC
  Administered 2016-06-21 – 2016-06-23 (×3): 6.25 mg via ORAL
  Filled 2016-06-20 (×3): qty 1

## 2016-06-20 MED ORDER — WARFARIN SODIUM 2.5 MG PO TABS
2.5000 mg | ORAL_TABLET | Freq: Once | ORAL | Status: AC
  Administered 2016-06-20: 2.5 mg via ORAL
  Filled 2016-06-20: qty 1

## 2016-06-20 MED ORDER — FUROSEMIDE 10 MG/ML IJ SOLN
80.0000 mg | Freq: Two times a day (BID) | INTRAMUSCULAR | Status: DC
  Administered 2016-06-20 – 2016-06-21 (×3): 80 mg via INTRAVENOUS
  Filled 2016-06-20 (×3): qty 8

## 2016-06-20 MED ORDER — WARFARIN SODIUM 2.5 MG PO TABS: 1.2500 mg | ORAL_TABLET | Freq: Once | ORAL | Status: DC

## 2016-06-20 NOTE — Progress Notes (Signed)
ANTICOAGULATION CONSULT NOTE - Follow Up Consult  Pharmacy Consult for Coumadin Indication: MVR and atrial fibrillation  Allergies  Allergen Reactions  . Meperidine Hcl Nausea And Vomiting  . Sulfamethoxazole-Trimethoprim Rash    REACTION: Sulfa Allergy  . Heparin Other (See Comments)    Pt is unsure about the reaction  . Mushroom Extract Complex Itching and Other (See Comments)    Eyes water    Patient Measurements: Height: 5\' 2"  (157.5 cm) Weight: 161 lb 4.8 oz (73.2 kg) IBW/kg (Calculated) : 50.1  Vital Signs: Temp: 98.2 F (36.8 C) (08/06 1141) Temp Source: Oral (08/06 1141) BP: 98/41 (08/06 1141) Pulse Rate: 72 (08/06 1141)  Labs:  Recent Labs  06/18/16 1853 06/19/16 0208 06/20/16 0512  LABPROT 50.2* 47.9* 35.2*  INR 5.31* 5.00* 3.41  CREATININE  --  1.56* 1.60*    Estimated Creatinine Clearance: 24.9 mL/min (by C-G formula based on SCr of 1.6 mg/dL).  Assessment: 85yof on coumadin for MVR and afib. INR 6.84 on 8/3 and patient was told to hold her coumadin x 3 days. INR trended down to 5.31 then 5. Today's INR is back within goal range at 3.41. No bleeding reported.  Home dose: 2.5mg  daily except 1.25mg  on T/TR/Sat  Goal of Therapy:  INR 2.5-3.5 Monitor platelets by anticoagulation protocol: Yes   Plan:  1) Coumadin 2.5mg  x 1 2) Daily INR  Fredrik RiggerMarkle, Sokha Craker Sue 06/20/2016,11:49 AM

## 2016-06-20 NOTE — Plan of Care (Signed)
Problem: Safety: Goal: Ability to remain free from injury will improve Outcome: Completed/Met Date Met: 06/20/16 Patient is aware of how to call for assistance and uses call light appropriately, patient is requesting to have all 4 siderails up for her comfort and repositioning. Patient has her call bell and phone at her bedside and her personal belongings on her bedside table within her reach.

## 2016-06-20 NOTE — Progress Notes (Addendum)
ADVANCED HF ROUNDING NOTE    SUBJECTIVE:   82 y/o woman with h/o valvular heart disease s/p MVR and TV repair with [facility] by Dr. Silvestre Mesi in 2005. No with chronic AF, PAH and R-sided HF.   Echo 8/5 reviewed personally: LVEF 55-60% with septal flattening. RV dilated moderate HK. Moderate to severe TR  RVSP 93. MVR stable with mild MS  Has had precipitous decline over past several weeks. Now essentially wheelchair and bedbound. Recent fall.   On IV lasix. Weight up a pound. But says leg swelling is much better.   Has seen Palliative Care and family meeting pending for 2p today. She is not interested in invasive procedures. Wants to be DNR     Intake/Output Summary (Last 24 hours) at 06/20/16 4098 Last data filed at 06/20/16 0900  Gross per 24 hour  Intake              300 ml  Output              700 ml  Net             -400 ml     Current Facility-Administered Medications  Medication Dose Route Frequency Provider Last Rate Last Dose  . 0.9 %  sodium chloride infusion  250 mL Intravenous PRN Ellsworth Lennox, [place]      . acetaminophen (TYLENOL) tablet 650 mg  650 mg Oral Q4H PRN Ellsworth Lennox, PA   650 mg at 06/19/16 1035  . albuterol (PROVENTIL) (2.5 MG/3ML) 0.083% nebulizer solution 2.5 mg  2.5 mg Nebulization Q6H PRN Ellsworth Lennox, [place]      . atorvastatin (LIPITOR) tablet 20 mg  20 mg Oral Daily Ellsworth Lennox, [place]   20 mg at 06/19/16 1035  . busPIRone (BUSPAR) tablet 7.5 mg  7.5 mg Oral BID Ellsworth Lennox, PA   7.5 mg at 06/19/16 2223  . carvedilol (COREG) tablet 12.5 mg  12.5 mg Oral Daily Ellsworth Lennox, [place]   12.5 mg at 06/19/16 1035  . diphenhydrAMINE (BENADRYL) capsule 25 mg  25 mg Oral QHS PRN Ellsworth Lennox, PA   25 mg at 06/19/16 2224  . docusate sodium (COLACE) capsule 100 mg  100 mg Oral BID Ellsworth Lennox, [place]   100 mg at 06/19/16 2223  . fenofibrate tablet 160 mg  160 mg Oral Daily Ellsworth Lennox, [place]   160 mg at 06/19/16 1035  .  furosemide (LASIX) injection 40 mg  40 mg Intravenous BID Ellsworth Lennox, [place]   40 mg at 06/19/16 1709  . insulin aspart (novoLOG) injection 0-15 Units  0-15 Units Subcutaneous TID [facility] Ellsworth Lennox, [place]   2 Units at 06/20/16 [phone]  . isosorbide mononitrate (IMDUR) 24 hr tablet 30 mg  30 mg Oral Daily Ellsworth Lennox, [place]   30 mg at 06/19/16 1035  . ondansetron (ZOFRAN) injection 4 mg  4 mg Intravenous Q6H PRN Ellsworth Lennox, [place]      . oxyCODONE-acetaminophen (PERCOCET/ROXICET) 5-325 MG per tablet 1 tablet  1 tablet Oral Q8H PRN Ellsworth Lennox, [place]   1 tablet at 06/19/16 2110  . sertraline (ZOLOFT) tablet 50 mg  50 mg Oral QHS Ellsworth Lennox, PA   50 mg at 06/19/16 2223  . sodium chloride flush (NS) 0.9 % injection 3 mL  3 mL Intravenous Q12H Ellsworth Lennox, [place]   3 mL at 06/19/16 2224  . sodium chloride flush (NS)  0.9 % injection 3 mL  3 mL Intravenous PRN Ellsworth LennoxBrittany M Strader, [place]        Vitals:   06/19/16 0424 06/19/16 1204 06/19/16 2130 06/20/16 0550  BP: 104/78 (!) 103/51 (!) 115/47 (!) 146/67  Pulse: 72 68 73 76  Resp:  18 18 18   Temp: 98.4 F (36.9 C) 98.2 F (36.8 C) 98.2 F (36.8 C) 98.1 F (36.7 C)  TempSrc: Oral Oral Oral Oral  SpO2: 93% 98% 100% 100%  Weight: 72.8 kg (160 lb 9.6 oz)   73.2 kg (161 lb 4.8 oz)  Height:        PHYSICAL EXAM General: Elderly. Frail. Lying in bed NAD HEENT: Normal. Neck: JVP to jaw, no thyromegaly.  Lungs: Clear to auscultation bilaterally with normal respiratory effort. CV: Nondisplaced PMI.  Regular rate and rhythm, S1 click/S2, no S3/S4,2/6 TR increased P2. + RV lift  1-2+ pretibial edema. Wrapped. With wounds Abdomen: Soft, nontender.  Neurologic: Alert and oriented.  Psych: Depressed, flat affect.  Tele: AF 70s (viewed personally 8/6)  LABS: Basic Metabolic Panel:  Recent Labs  09/81/1907/04/01 0208 06/20/16 0512  NA 140 139  K 3.8 4.0  CL 102 100*  CO2 27 26  GLUCOSE 73 99  BUN 33* 32*  CREATININE 1.56* 1.60*    CALCIUM 8.7* 8.3*   Liver Function Tests: No results for input(s): AST, ALT, ALKPHOS, BILITOT, PROT, ALBUMIN in the last 72 hours. No results for input(s): LIPASE, AMYLASE in the last 72 hours. CBC: No results for input(s): WBC, NEUTROABS, HGB, HCT, MCV, PLT in the last 72 hours. Cardiac Enzymes: No results for input(s): CKTOTAL, CKMB, CKMBINDEX, TROPONINI in the last 72 hours. BNP: Invalid input(s): POCBNP D-Dimer: No results for input(s): DDIMER in the last 72 hours. Hemoglobin A1C: No results for input(s): HGBA1C in the last 72 hours. Fasting Lipid Panel: No results for input(s): CHOL, HDL, LDLCALC, TRIG, CHOLHDL, LDLDIRECT in the last 72 hours. Thyroid Function Tests: No results for input(s): TSH, T4TOTAL, T3FREE, THYROIDAB in the last 72 hours.  Invalid input(s): FREET3 Anemia Panel: No results for input(s): VITAMINB12, FOLATE, FERRITIN, TIBC, IRON, RETICCTPCT in the last 72 hours.  RADIOLOGY: No results found.    ASSESSMENT AND PLAN:  1. Severe pulm HTN with RV failure volume overload:   2. Mechanical MVR: INR supratherapeutic. Warfarin on hold. Being managed by pharmacy.  3. Chronic AF  4. CKD stage IV  She has end-stage PAH and RV failure in the setting of long-standing MV disease. THis has been c/b severe cardiorenal syndrome. She is now quite debilitated and essentially bedbound though she is very lucid.   I had long discussion with her and her family (nearly 1 hour) regarding the pathophysiology of her PAH and the trajectory of her illness. With her progressive debility we discussed the fact that they now have to make the decision if they want to transition from aggressive management with HF Clinic and HHRN/PT to a more palliative approach with Hospice. They will discuss with palliative care today.   [facility]   She is clear that she wants  to be DNR/DNI and I will change the chart.   Will increase lasix to 80 IV bid and then switch to torsemide and metolazone on d/c. I have discontinued all nonessential meds and cut carvedilol back as well.   Continue coumadin for MVR and  AF  Khira Cudmore,MD 10:44 AM

## 2016-06-20 NOTE — Progress Notes (Signed)
Daily Progress Note   Patient Name: Kelsey Cochran       Date: 06/20/2016 DOB: Feb 07, 1930  Age: 82 y.o. MRN#: [id number] Attending Physician: Kelsey Hector, MD Primary Care Physician: Kelsey Asa, MD Admit Date: 06/18/2016  Reason for Consultation/Follow-up: Establishing goals of care, Hospice Evaluation and Psychosocial/spiritual support  Subjective: Patient reports that her breathing is better, but she does still have significant dyspnea upon exertion. Patient met with Dr. Luane Cochran this morning. She finalized DO NOT RESUSCITATE.  Length of Stay: 2  Current Medications: Scheduled Meds:  . busPIRone  7.5 mg Oral BID  . [START ON 06/21/2016] carvedilol  6.25 mg Oral Daily  . docusate sodium  100 mg Oral BID  . furosemide  80 mg Intravenous BID  . insulin aspart  0-15 Units Subcutaneous TID WC  . isosorbide mononitrate  30 mg Oral Daily  . sertraline  50 mg Oral QHS  . sodium chloride flush  3 mL Intravenous Q12H  . Warfarin - Pharmacist Dosing Inpatient   Does not apply q1800    Continuous Infusions:    PRN Meds: sodium chloride, acetaminophen, albuterol, diphenhydrAMINE, ondansetron (ZOFRAN) IV, oxyCODONE-acetaminophen, sodium chloride flush  Physical Exam  Constitutional: She is oriented to person, place, and time. She appears well-developed and well-nourished.  HENT:  Head: Normocephalic and atraumatic.  Neck: Normal range of motion.  Pulmonary/Chest:  DOE  Musculoskeletal: Normal range of motion.  Neurological: She is alert and oriented to person, place, and time.  Skin: Skin is warm and dry.  Psychiatric: She has a normal mood and affect. Her behavior is normal. Thought content normal.  Nursing note and vitals reviewed.           Vital Signs: BP (!) 98/41 (BP  Location: Left Arm)   Pulse 72   Temp 98.2 F (36.8 C) (Oral)   Resp 18   Ht _0  (1.575 m)   Wt 73.2 kg (161 lb 4.8 oz)   SpO2 92%   BMI 29.50 kg/m  SpO2: SpO2: 92 % O2 Device: O2 Device: Not Delivered O2 Flow Rate:    Intake/output summary:  Intake/Output Summary (Last 24 hours) at 06/20/16 1822 Last data filed at 06/20/16 1700  Gross per 24 hour  Intake  400 ml  Output              800 ml  Net             -400 ml   LBM: Last BM Date: 06/19/16 Baseline Weight: Weight: 72.8 kg (160 lb 6.4 oz) Most recent weight: Weight: 73.2 kg (161 lb 4.8 oz)       Palliative Assessment/Data:    Flowsheet Rows   Flowsheet Row Most Recent Value  Intake Tab  Referral Department  Cardiology  Unit at Time of Referral  Med/Surg Unit  Palliative Care Primary Diagnosis  Cardiac  Date Notified  06/18/16  Palliative Care Type  New Palliative care  Reason for referral  Clarify Goals of Care  Date of Admission  06/18/16  Date first seen by Palliative Care  06/19/16  # of days Palliative referral response time  1 Day(s)  # of days IP prior to Palliative referral  0  Clinical Assessment  Palliative Performance Scale Score  50%  Pain Max last 24 hours  0  Pain Min Last 24 hours  0  Dyspnea Max Last 24 Hours  6  Dyspnea Min Last 24 hours  3  Nausea Max Last 24 Hours  0  Nausea Min Last 24 Hours  0  Anxiety Max Last 24 Hours  0  Anxiety Min Last 24 Hours  0  Psychosocial & Spiritual Assessment  Palliative Care Outcomes  Patient/Family meeting held?  Yes  Who was at the meeting?  1 daughter and SIL,  plan to meet other 5 children 06/20/16  Patient/Family wishes: Interventions discontinued/not started   PEG, Tube feedings/TPN, Hemodialysis, Trach  Palliative Care follow-up planned  Yes, Facility      Patient Active Problem List   Diagnosis Date Noted  . Palliative care encounter   . Volume overload 06/18/2016  . Acute on chronic diastolic (congestive) heart failure ([place])  06/18/2016  . Frequent falls 12/04/2015  . Leg wound, left 12/04/2015  . Generalized anxiety disorder 08/26/2014  . Chest pain 08/12/2014  . Depression 08/12/2014  . Encounter for therapeutic drug monitoring 12/20/2013  . Vomiting 08/24/2013  . Carpal tunnel syndrome 01/01/2013  . Pain in joint, multiple sites 08/01/2012  . Pulmonary hypertension, secondary ([place]) 01/31/2012  . Actinic keratosis 01/18/2012  . Routine general medical examination at a health care facility 04/16/2011  . BICEPS TENDON RUPTURE, RIGHT 01/04/2011  . Mitral valve disease 12/28/2010  . Obstructive chronic bronchitis with exacerbation ([place]) 06/03/2010  . HIP FRACTURE, LEFT 03/16/2010  . POSTMENOPAUSAL STATUS 03/16/2010  . Vitamin D deficiency 09/08/2009  . HIP PAIN 07/10/2009  . LOW BLOOD PRESSURE 06/11/2009  . Fatigue 06/11/2009  . WEIGHT LOSS 06/11/2009  . UNS ADVRS EFF UNS RX MEDICINAL&BIOLOGICAL SBSTNC 11/28/2008  . ANEMIA 10/28/2008  . CAROTID OCCLUSIVE DISEASE 10/07/2008  . DM (diabetes mellitus) with peripheral vascular complication ([place]) 24/26/8341  . HYPERCHOLESTEROLEMIA 09/11/2008  . ESSENTIAL HYPERTENSION, BENIGN 09/11/2008  . ATRIAL FIBRILLATION 09/11/2008  . CHRONIC DIASTOLIC HEART FAILURE 96/22/2979  . TIA 09/11/2008  . MITRAL VALVE REPLACEMENT, HX OF 09/11/2008    Palliative Care Assessment & Plan   Patient Profile  82 y/o woman with h/o valvular heart disease s/p MVR and TV repair with [facility] by Dr. Evelina Cochran in 2005. No with chronic AF, PAH and R-sided HF.   Echo 8/5 reviewed personally: LVEF 55-60% with septal flattening. RV dilated moderate HK. Moderate to severe TR  RVSP 93. MVR stable with mild MS  Has  had precipitous decline over past several weeks. Now essentially wheelchair and bedbound. Recent fall.   On IV lasix. Weight up a pound. But says leg swelling is much better.   . She is not interested in invasive procedures. Wants to be DNR      Assessment: Met with all  6 of her children both with the patient and outside of the room. Discussed various issues as raised by their meeting with Dr. Luane Cochran, CODE STATUS, hospice benefits, as well as healthcare power of attorney. Family is very supportive of their mother in any of her wishes. They are in favor of her letting hospice if that is what she wishes.  Recommendations/Plan:  When medically ready, will discharge to her daughter Kelsey Cochran's house in [place] with hospice support in the home  Patient will need equipment in the home particularly a hospital bed before she is discharged  Patient is a DNR/DNI  Patient wishes to continue on her Coumadin as long as she can swallow and would need to have hospice agreeable to follow this  Patient also wishes to continue on diuretic and may request occasional blood draw to follow-up on electrolyte status. I did inform them that hospice does not drawl labs on a routine basis. The lab draws will be done on case-by-case basis  I informed them that hospice does not do IV fluids or IV antibiotics and they're comfortable with this. They may however elect to treat a urinary tract infection if that were to happen with oral antibiotics  Goals of Care and Additional Recommendations:  Limitations on Scope of Treatment: Avoid Hospitalization, Minimize Medications, No Artificial Feeding, No Blood Transfusions, No Chemotherapy, No Hemodialysis, No Radiation, No Surgical Procedures and No Tracheostomy  Code Status:    Code Status Orders        Start     Ordered   06/20/16 1046  Do not attempt resuscitation (DNR)  Continuous    Question Answer Comment  In the event of cardiac or respiratory ARREST Do not call a "code blue"   In the event of cardiac or respiratory ARREST Do not perform Intubation, CPR, defibrillation or ACLS   In the event of cardiac or respiratory ARREST Use medication by any route, position, wound care, and other measures to relive pain and suffering. May use  oxygen, suction and manual treatment of airway obstruction as needed for comfort.      06/20/16 1046    Code Status History    Date Active Date Inactive Code Status Order ID Comments User Context   06/18/2016  5:43 PM 06/20/2016 10:46 AM Full Code [id number]  Erma Heritage, PA Inpatient       Prognosis:   < 6 months in the setting of severe pulmonary hypertension with RV failure, volume overload, chronic kidney disease stage IV, severe functional decline or patient now is minimally ambulatory, eating bites and sips  Discharge Planning:  Home with Hospice  Thank you for allowing the Palliative Medicine Team to assist in the care of this patient.   Time In: 1400 Time Out: 1515 Total Time 75 min Prolonged Time Billed  no       Greater than 50%  of this time was spent counseling and coordinating care related to the above assessment and plan.  Dory Horn, NP  Please contact Palliative Medicine Team phone at [phone] for questions and concerns.

## 2016-06-20 NOTE — Progress Notes (Signed)
Report received in patient's room using SBAR format, reviewed orders,labs, VS, meds and patient's general condition, assumed care of patient. 

## 2016-06-21 DIAGNOSIS — L8990 Pressure ulcer of unspecified site, unspecified stage: Secondary | ICD-10-CM | POA: Insufficient documentation

## 2016-06-21 LAB — BASIC METABOLIC PANEL
Anion gap: 8 (ref 5–15)
BUN: 29 mg/dL — AB (ref 6–20)
CHLORIDE: 101 mmol/L (ref 101–111)
CO2: 30 mmol/L (ref 22–32)
CREATININE: 1.58 mg/dL — AB (ref 0.44–1.00)
Calcium: 8.4 mg/dL — ABNORMAL LOW (ref 8.9–10.3)
GFR calc Af Amer: 34 mL/min — ABNORMAL LOW (ref >60)
GFR calc non Af Amer: 29 mL/min — ABNORMAL LOW (ref >60)
Glucose, Bld: 99 mg/dL (ref 65–99)
Potassium: 3.4 mmol/L — ABNORMAL LOW (ref 3.5–5.1)
SODIUM: 139 mmol/L (ref 135–145)

## 2016-06-21 LAB — GLUCOSE, CAPILLARY
GLUCOSE-CAPILLARY: 188 mg/dL — AB (ref 65–99)
Glucose-Capillary: 114 mg/dL — ABNORMAL HIGH (ref 65–99)
Glucose-Capillary: 155 mg/dL — ABNORMAL HIGH (ref 65–99)
Glucose-Capillary: 103 mg/dL — ABNORMAL HIGH (ref 65–99)

## 2016-06-21 LAB — PROTIME-INR
INR: 2.37
PROTHROMBIN TIME: 26.3 s — AB (ref 11.4–15.2)

## 2016-06-21 MED ORDER — POTASSIUM CHLORIDE CRYS ER 20 MEQ PO TBCR
40.0000 meq | EXTENDED_RELEASE_TABLET | Freq: Once | ORAL | Status: AC
  Administered 2016-06-21: 40 meq via ORAL
  Filled 2016-06-21: qty 2

## 2016-06-21 MED ORDER — WARFARIN SODIUM 2 MG PO TABS
4.0000 mg | ORAL_TABLET | Freq: Once | ORAL | Status: AC
  Administered 2016-06-21: 4 mg via ORAL
  Filled 2016-06-21: qty 2

## 2016-06-21 MED ORDER — METOLAZONE 5 MG PO TABS
5.0000 mg | ORAL_TABLET | Freq: Every day | ORAL | Status: DC
  Administered 2016-06-21: 5 mg via ORAL
  Filled 2016-06-21: qty 1

## 2016-06-21 NOTE — Consult Note (Addendum)
WOC Nurse wound consult note Reason for Consult: Consult requested for buttocks and bilat legs.  Pt fell prior to admission last week and has extensive bruising across bilat buttocks; this is hard to palpation below the skin level where clotted blood has collected beneath the surface.  This has evolved into a stage 2 pressure injury to the right buttock; 2X1X.1cm, pink and moist, small amt yellow drainage, no odor or drainage. Wound type: Bilat legs with several full thickness abrasions from previous falls; Left upper leg 1X3X.1cm, pink and moist, scant amt yellow drainage, no odor left lower leg 1X1X.1cm, 10% red and moist, 90% old clotted blood, scant ant pink drainage, no odor Right middle leg 3X3X.1cm, pink and moist, scant ant pink drainage, no odor Pressure Ulcer POA: Yes Dressing procedure/placement/frequency: Foam dressings to protect and promote healing.  Discussed plan of care with patient and daughter at the bedside and they verbalized understanding. Please re-consult if further assistance is needed.  Thank-you,  Cammie Mcgeeawn Geovanna Simko MSN, RN, CWOCN, [place]CN-AP, CNS [phone]

## 2016-06-21 NOTE — Care Management Note (Signed)
Case Management Note  Patient Details  Name: Kelsey Cochran MRN: [id number] Date of Birth: 03/07/1930            Action/Plan: Received referral for home hospice; CM talked to patient with her daughter Kelsey Cochran present, [facility]; referral made as requested.  Expected Discharge Date:  06/22/16               Expected Discharge Plan:  Home w Hospice Care    Discharge planning Services  CM Consult  Post Acute Care Choice:    Choice offered to:  Patient, Adult Children    [facility]  [facility]  Status of Service:  In process, will continue to follow  Reola MosherChandler, Mohd Clemons L, RN,MHA,BSN [phone] 06/21/2016, 11:49 AM

## 2016-06-21 NOTE — Progress Notes (Signed)
[facility]  [facility]. Chart reviewed and spoke with Amy Clegg prior to meeting with patient, two daughters, a son, another son joined via speaker phone. Answered questions, explained services, confirmed discharge address and DME needs. Clinical information being reviewed by hospice medical director. DME has been ordered including hospital bed with split rails, OBT, right drop arm BSC and home oxygen package. Patient is going to home of daughter Lovenia ShuckBeth Chambers at [address], [place] [id number]. [facility] will arrange delivery of DME with Beth.    Upon discharge: Please send Out of Facility DNR with patient.  Please send scripts for comfort medications.  Please fax discharge summary to [phone]. Please call [facility] at [phone] when leaving unit.   Thank you for this referral and to [facility] and Amy Clegg helping get things set up.  Forrestine Himva Sinai Illingworth, LCSW [phone]

## 2016-06-21 NOTE — Progress Notes (Signed)
ADVANCED HF ROUNDING NOTE    SUBJECTIVE:  82 y/o woman with h/o valvular heart disease s/p MVR and TV repair with M[facility] by Dr. Silvestre MesiGlower in 2005. No with chronic AF, PAH and R-sided HF.   Echo 8/5 reviewed personally: LVEF 55-60% with septal flattening. RV dilated moderate HK. Moderate to severe TR  RVSP 93. MVR stable with mild MS  Yesterday diuresed with IV lasix. Weight up 1 pound. Creatinine 1.58>1.60.    Intake/Output Summary (Last 24 hours) at 06/21/16 1119 Last data filed at 06/21/16 [id number]  Gross per 24 hour  Intake              220 ml  Output             1000 ml  Net             -780 ml     Current Facility-Administered Medications  Medication Dose Route Frequency Provider Last Rate Last Dose  . 0.9 %  sodium chloride infusion  250 mL Intravenous PRN Ellsworth LennoxBrittany M Strader, [place]      . acetaminophen (TYLENOL) tablet 650 mg  650 mg Oral Q4H PRN Ellsworth LennoxBrittany M Strader, PA   650 mg at 06/19/16 1035  . albuterol (PROVENTIL) (2.5 MG/3ML) 0.083% nebulizer solution 2.5 mg  2.5 mg Nebulization Q6H PRN Ellsworth LennoxBrittany M Strader, PA      . busPIRone (BUSPAR) tablet 7.5 mg  7.5 mg Oral BID Ellsworth LennoxBrittany M Strader, PA   7.5 mg at 06/21/16 1111  . carvedilol (COREG) tablet 6.25 mg  6.25 mg Oral Daily Dolores Pattyaniel R Akiah Bauch, MD   6.25 mg at 06/21/16 1111  . diphenhydrAMINE (BENADRYL) capsule 25 mg  25 mg Oral QHS PRN Ellsworth LennoxBrittany M Strader, PA   25 mg at 06/20/16 2301  . docusate sodium (COLACE) capsule 100 mg  100 mg Oral BID Ellsworth LennoxBrittany M Strader, PA   100 mg at 06/21/16 1111  . furosemide (LASIX) injection 80 mg  80 mg Intravenous BID Dolores Pattyaniel R Thirza Pellicano, MD   80 mg at 06/21/16 1105  . insulin aspart (novoLOG) injection 0-15 Units  0-15 Units Subcutaneous TID [facility] Ellsworth LennoxBrittany M Strader, [place]   3 Units at 06/20/16 1702  . isosorbide mononitrate (IMDUR) 24 hr tablet 30 mg  30 mg Oral Daily Ellsworth LennoxBrittany M Strader, [place]   30 mg at 06/21/16 1112  . ondansetron (ZOFRAN) injection 4 mg  4 mg Intravenous Q6H PRN Ellsworth LennoxBrittany M Strader, [place]        . oxyCODONE-acetaminophen (PERCOCET/ROXICET) 5-325 MG per tablet 1 tablet  1 tablet Oral Q8H PRN Ellsworth LennoxBrittany M Strader, [place]   1 tablet at 06/20/16 1804  . sertraline (ZOLOFT) tablet 50 mg  50 mg Oral QHS Ellsworth LennoxBrittany M Strader, [place]   50 mg at 06/20/16 2140  . sodium chloride flush (NS) 0.9 % injection 3 mL  3 mL Intravenous Q12H Ellsworth LennoxBrittany M Strader, PA   3 mL at 06/21/16 1000  . sodium chloride flush (NS) 0.9 % injection 3 mL  3 mL Intravenous PRN Ellsworth LennoxBrittany M Strader, PA      . warfarin (COUMADIN) tablet 4 mg  4 mg Oral ONCE-1800 Wendall StadePeter C Nishan, MD      . Warfarin - Pharmacist Dosing Inpatient   Does not apply q1800 Emi HolesJennifer S Markle, [facility]        Vitals:   06/20/16 0550 06/20/16 1141 06/20/16 2130 06/21/16 0427  BP: (!) 146/67 (!) 98/41 (!) 116/58 (!) 121/52  Pulse: 76 72 75 73  Resp:  Temp: 98.1 F (36.7 C) 98.2 F (36.8 C) 98.1 F (36.7 C) 98 F (36.7 C)  TempSrc: Oral Oral Oral Oral  SpO2: 100% 92% 97% 97%  Weight: 161 lb 4.8 oz (73.2 kg)   162 lb 4.8 oz (73.6 kg)  Height:        PHYSICAL EXAM General: Elderly. Frail. Lying in bed NAD. Daughter at bedside.  HEENT: Normal. Neck: JVP to jaw, no thyromegaly.  Lungs: Clear  CV: Nondisplaced PMI.  Irregular rate and rhythm, S1 click/S2, no S3/S4,2/6 TR increased P2. + RV lift  1-2+ pretibial edema. Wrapped. With wounds Abdomen: Soft, nontender.  Neurologic: Alert and oriented.   Tele: Afib 70s.  LABS: Basic Metabolic Panel:  Recent Labs  09/81/19 0512 06/21/16 0411  NA 139 139  K 4.0 3.4*  CL 100* 101  CO2 26 30  GLUCOSE 99 99  BUN 32* 29*  CREATININE 1.60* 1.58*  CALCIUM 8.3* 8.4*   Liver Function Tests: No results for input(s): AST, ALT, ALKPHOS, BILITOT, PROT, ALBUMIN in the last 72 hours. No results for input(s): LIPASE, AMYLASE in the last 72 hours. CBC: No results for input(s): WBC, NEUTROABS, HGB, HCT, MCV, PLT in the last 72 hours. Cardiac Enzymes: No results for input(s): CKTOTAL, CKMB, CKMBINDEX,  TROPONINI in the last 72 hours. BNP: Invalid input(s): POCBNP D-Dimer: No results for input(s): DDIMER in the last 72 hours. Hemoglobin A1C: No results for input(s): HGBA1C in the last 72 hours. Fasting Lipid Panel: No results for input(s): CHOL, HDL, LDLCALC, TRIG, CHOLHDL, LDLDIRECT in the last 72 hours. Thyroid Function Tests: No results for input(s): TSH, T4TOTAL, T3FREE, THYROIDAB in the last 72 hours.  Invalid input(s): FREET3 Anemia Panel: No results for input(s): VITAMINB12, FOLATE, FERRITIN, TIBC, IRON, RETICCTPCT in the last 72 hours.  RADIOLOGY: No results found.    ASSESSMENT AND PLAN:  1. Severe pulm HTN with RV failure volume overload:  2. Mechanical MVR: INR supratherapeutic. Warfarin on hold. Being managed by pharmacy. 3. Chronic AF 4. CKD stage IV 5. DNR    DNR -- Severe pulmonary HF/RV failure. [facility] consulted. Plan to set up with Hospice today.  She has end-stage PAH and RV failure in the setting of long-standing MV disease. THis has been c/b severe cardiorenal syndrome. She is now quite debilitated and essentially bedbound though she is very lucid.   Volume status remains elevated. Renal function ok. Continue 80 mg IV lasix twice daily. Add 5 mg metolazone daily.   Continue coumadin for MVR and AF. Pharmacy dosing coumadin. INR 2.37  Possible D/C 24 hour-48 hours.    Tonye Becket, NP-C  11:19 AM  Patient seen and examined with Tonye Becket, NP. We discussed all aspects of the encounter. I agree with the assessment and plan as stated above.   She is diuresing well with IV lasix and addition of metolazone. Renal function stable.  I have reviewed Palliative Care Notes and have had a long talk with her and her son about possible trajectory of her illness. I suspect she has < 6 months to live. Would continue diuresis today. Switch to torsemide and po metolazone tomorrow and would send hoe with Hospice likely Wednesday am when she is on oral diuretics and  Hospice services are set up. Continue coumadin for MVR and AF.   Anais Denslow,MD 9:47 PM

## 2016-06-21 NOTE — Progress Notes (Signed)
ANTICOAGULATION CONSULT NOTE - Follow Up Consult  Pharmacy Consult for Coumadin Indication: MVR and atrial fibrillation  Allergies  Allergen Reactions  . Meperidine Hcl Nausea And Vomiting  . Sulfamethoxazole-Trimethoprim Rash    REACTION: Sulfa Allergy  . Heparin Other (See Comments)    Pt is unsure about the reaction  . Mushroom Extract Complex Itching and Other (See Comments)    Eyes water    Patient Measurements: Height: 5\' 2"  (157.5 cm) Weight: 162 lb 4.8 oz (73.6 kg) IBW/kg (Calculated) : 50.1  Vital Signs: Temp: 98 F (36.7 C) (08/07 0427) Temp Source: Oral (08/07 0427) BP: 121/52 (08/07 0427) Pulse Rate: 73 (08/07 0427)  Labs:  Recent Labs  06/19/16 0208 06/20/16 0512 06/21/16 0411  LABPROT 47.9* 35.2* 26.3*  INR 5.00* 3.41 2.37  CREATININE 1.56* 1.60* 1.58*    Estimated Creatinine Clearance: 25.3 mL/min (by C-G formula based on SCr of 1.58 mg/dL).  Assessment: 85yof on coumadin for MVR and afib. INR 6.84 on 8/3 and patient was told to hold her coumadin x 3 days  INR = 2.37  Home dose: 2.5mg  daily except 1.25mg  on T/TR/Sat  Goal of Therapy:  INR 2.5-3.5 Monitor platelets by anticoagulation protocol: Yes   Plan:  1) Coumadin 4mg  po x 1 2) Daily INR  Thank you Okey RegalLisa Davena Julian, PharmD [phone]  06/21/2016,10:09 AM

## 2016-06-21 NOTE — Care Management Important Message (Signed)
Important Message  Patient Details  Name: Kelsey Cochran MRN: [id number] Date of Birth: 05/20/1930   Medicare Important Message Given:  Yes    Bernadette HoitShoffner, Telia Amundson Coleman 06/21/2016, 8:37 AM

## 2016-06-21 NOTE — Progress Notes (Signed)
   06/21/16 0900  Clinical Encounter Type  Visited With Patient and family together  Visit Type Initial  Referral From Nurse  Consult/Referral To Chaplain  Spiritual Encounters  Spiritual Needs Other (Comment) (AD)  CHP provided AD materials to patient and wife. [facility] available to follow up to arrange notarizing.  Rodney BoozeGail L Yolonda Purtle 06/21/16

## 2016-06-22 ENCOUNTER — Telehealth: Payer: Self-pay | Admitting: Family Medicine

## 2016-06-22 DIAGNOSIS — N183 Chronic kidney disease, stage 3 (moderate): Secondary | ICD-10-CM

## 2016-06-22 LAB — BASIC METABOLIC PANEL
ANION GAP: 10 (ref 5–15)
BUN: 28 mg/dL — ABNORMAL HIGH (ref 6–20)
CALCIUM: 9.1 mg/dL (ref 8.9–10.3)
CO2: 34 mmol/L — AB (ref 22–32)
Chloride: 94 mmol/L — ABNORMAL LOW (ref 101–111)
Creatinine, Ser: 1.51 mg/dL — ABNORMAL HIGH (ref 0.44–1.00)
GFR, EST AFRICAN AMERICAN: 36 mL/min — AB (ref >60)
GFR, EST NON AFRICAN AMERICAN: 31 mL/min — AB (ref >60)
Glucose, Bld: 168 mg/dL — ABNORMAL HIGH (ref 65–99)
POTASSIUM: 3 mmol/L — AB (ref 3.5–5.1)
Sodium: 138 mmol/L (ref 135–145)

## 2016-06-22 LAB — GLUCOSE, CAPILLARY
GLUCOSE-CAPILLARY: 206 mg/dL — AB (ref 65–99)
Glucose-Capillary: 100 mg/dL — ABNORMAL HIGH (ref 65–99)
Glucose-Capillary: 214 mg/dL — ABNORMAL HIGH (ref 65–99)
Glucose-Capillary: 180 mg/dL — ABNORMAL HIGH (ref 65–99)

## 2016-06-22 LAB — PROTIME-INR
INR: 2.48
Prothrombin Time: 27.3 seconds — ABNORMAL HIGH (ref 11.4–15.2)

## 2016-06-22 MED ORDER — FUROSEMIDE 10 MG/ML IJ SOLN
80.0000 mg | Freq: Once | INTRAMUSCULAR | Status: AC
  Administered 2016-06-22: 80 mg via INTRAVENOUS
  Filled 2016-06-22: qty 8

## 2016-06-22 MED ORDER — WARFARIN SODIUM 2.5 MG PO TABS: 2.5000 mg | ORAL_TABLET | Freq: Once | ORAL | Status: DC

## 2016-06-22 MED ORDER — TORSEMIDE 20 MG PO TABS
40.0000 mg | ORAL_TABLET | Freq: Once | ORAL | Status: AC
Start: 2016-06-22 — End: 2016-06-22
  Administered 2016-06-22: 40 mg via ORAL
  Filled 2016-06-22: qty 2

## 2016-06-22 MED ORDER — METOLAZONE 2.5 MG PO TABS
2.5000 mg | ORAL_TABLET | Freq: Once | ORAL | Status: AC
Start: 2016-06-22 — End: 2016-06-22
  Administered 2016-06-22: 2.5 mg via ORAL
  Filled 2016-06-22: qty 1

## 2016-06-22 MED ORDER — WARFARIN SODIUM 2 MG PO TABS
4.0000 mg | ORAL_TABLET | Freq: Once | ORAL | Status: AC
  Administered 2016-06-22: 4 mg via ORAL
  Filled 2016-06-22 (×2): qty 2

## 2016-06-22 MED ORDER — TORSEMIDE 20 MG PO TABS
60.0000 mg | ORAL_TABLET | Freq: Every day | ORAL | Status: DC
  Administered 2016-06-23: 60 mg via ORAL
  Filled 2016-06-22: qty 3

## 2016-06-22 MED ORDER — POTASSIUM CHLORIDE CRYS ER 20 MEQ PO TBCR
40.0000 meq | EXTENDED_RELEASE_TABLET | Freq: Two times a day (BID) | ORAL | Status: AC
  Administered 2016-06-22 (×2): 40 meq via ORAL
  Filled 2016-06-22 (×2): qty 2

## 2016-06-22 NOTE — Progress Notes (Signed)
   06/22/16 1600  Clinical Encounter Type  Visited With Patient and family together  Visit Type Follow-up  Referral From Chaplain  Consult/Referral To Chaplain  Followed up on progress of AD.  Patient no longer interested. Rodney BoozeGail L Nandan Willems 06/22/16

## 2016-06-22 NOTE — Progress Notes (Addendum)
ANTICOAGULATION CONSULT NOTE - Follow Up Consult  Pharmacy Consult for Coumadin Indication: MVR and atrial fibrillation  Allergies  Allergen Reactions  . Meperidine Hcl Nausea And Vomiting  . Sulfamethoxazole-Trimethoprim Rash    REACTION: Sulfa Allergy  . Heparin Other (See Comments)    Pt is unsure about the reaction  . Mushroom Extract Complex Itching and Other (See Comments)    Eyes water    Patient Measurements: Height: 5\' 2"  (157.5 cm) Weight: 158 lb 11.2 oz (72 kg) IBW/kg (Calculated) : 50.1  Vital Signs: Temp: 98.2 F (36.8 C) (08/08 0425) Temp Source: Oral (08/08 0425) BP: 131/48 (08/08 0425) Pulse Rate: 69 (08/08 0425)  Labs:  Recent Labs  06/20/16 0512 06/21/16 0411 06/22/16 0314  LABPROT 35.2* 26.3* 27.3*  INR 3.41 2.37 2.48  CREATININE 1.60* 1.58*  --     Estimated Creatinine Clearance: 25.1 mL/min (by C-G formula based on SCr of 1.58 mg/dL).  Assessment: 85yof on coumadin for MVR and afib. INR 6.84 on 8/3 and patient was told to hold her coumadin x 3 days  INR = 2.48  Home dose: 2.5mg  daily except 1.25mg  on T/TR/Sat  Goal of Therapy:  INR 2.5-3.5 Monitor platelets by anticoagulation protocol: Yes   Plan:  1) Coumadin 4mg  po x 1 2) Daily INR  Sheppard CoilFrank Wilson PharmD., BCPS Clinical Pharmacist Pager [phone] 06/22/2016 8:43 AM

## 2016-06-22 NOTE — Telephone Encounter (Signed)
Kelsey Cochran with hospice informed and expressed an understanding.

## 2016-06-22 NOTE — Telephone Encounter (Signed)
I do not typically have extra standing orders outside of what Hospice usually does.  I will be happy to sign any forms that they have for the admission process but do not have any special orders at this time as this is not my area of expertise.  May need to discuss possibility of having Hospice MD on board in the future

## 2016-06-22 NOTE — Discharge Instructions (Signed)
Information on my medicine - Coumadin   (Warfarin)  This medication education was reviewed with me or my healthcare representative as part of my discharge preparation.  The pharmacist that spoke with me during my hospital stay was:  Severiano GilbertWilson, Christabel Camire Rhea, [facility]  Why was Coumadin prescribed for you? Coumadin was prescribed for you because you have a blood clot or a medical condition that can cause an increased risk of forming blood clots. Blood clots can cause serious health problems by blocking the flow of blood to the heart, lung, or brain. Coumadin can prevent harmful blood clots from forming. As a reminder your indication for Coumadin is:   Blood Clot Prevention After Heart Valve Surgery  What test will check on my response to Coumadin? While on Coumadin (warfarin) you will need to have an INR test regularly to ensure that your dose is keeping you in the desired range. The INR (international normalized ratio) number is calculated from the result of the laboratory test called prothrombin time (PT).  If an INR APPOINTMENT HAS NOT ALREADY BEEN MADE FOR YOU please schedule an appointment to have this lab work done by your health care provider within 7 days. Your INR goal is usually a number between:  2.5-3.5 Ask your health care provider during an office visit what your goal INR is.  What  do you need to  know  About  COUMADIN? Take Coumadin (warfarin) exactly as prescribed by your healthcare provider about the same time each day.  DO NOT stop taking without talking to the doctor who prescribed the medication.  Stopping without other blood clot prevention medication to take the place of Coumadin may increase your risk of developing a new clot or stroke.  Get refills before you run out.  What do you do if you miss a dose? If you miss a dose, take it as soon as you remember on the same day then continue your regularly scheduled regimen the next day.  Do not take two doses of Coumadin at the same  time.  Important Safety Information A possible side effect of Coumadin (Warfarin) is an increased risk of bleeding. You should call your healthcare provider right away if you experience any of the following: ? Bleeding from an injury or your nose that does not stop. ? Unusual colored urine (red or dark Conly) or unusual colored stools (red or black). ? Unusual bruising for unknown reasons. ? A serious fall or if you hit your head (even if there is no bleeding).  Some foods or medicines interact with Coumadin (warfarin) and might alter your response to warfarin. To help avoid this: ? Eat a balanced diet, maintaining a consistent amount of Vitamin K. ? Notify your provider about major diet changes you plan to make. ? Avoid alcohol or limit your intake to 1 drink for women and 2 drinks for men per day. (1 drink is 5 oz. wine, 12 oz. beer, or 1.5 oz. liquor.)  Make sure that ANY health care provider who prescribes medication for you knows that you are taking Coumadin (warfarin).  Also make sure the healthcare provider who is monitoring your Coumadin knows when you have started a new medication including herbals and non-prescription products.  Coumadin (Warfarin)  Major Drug Interactions  Increased Warfarin Effect Decreased Warfarin Effect  Alcohol (large quantities) Antibiotics (esp. Septra/Bactrim, Flagyl, Cipro) Amiodarone (Cordarone) Aspirin (ASA) Cimetidine (Tagamet) Megestrol (Megace) NSAIDs (ibuprofen, naproxen, etc.) Piroxicam (Feldene) Propafenone (Rythmol SR) Propranolol (Inderal) Isoniazid (INH) Posaconazole (Noxafil) Barbiturates (  Phenobarbital) Carbamazepine (Tegretol) Chlordiazepoxide (Librium) Cholestyramine (Questran) Griseofulvin Oral Contraceptives Rifampin Sucralfate (Carafate) Vitamin K   Coumadin (Warfarin) Major Herbal Interactions  Increased Warfarin Effect Decreased Warfarin Effect  Garlic Ginseng Ginkgo biloba Coenzyme Q10 Green tea St. Johns wort     Coumadin (Warfarin) FOOD Interactions  Eat a consistent number of servings per week of foods HIGH in Vitamin K (1 serving =  cup)  Collards (cooked, or boiled & drained) Kale (cooked, or boiled & drained) Mustard greens (cooked, or boiled & drained) Parsley *serving size only =  cup Spinach (cooked, or boiled & drained) Swiss chard (cooked, or boiled & drained) Turnip greens (cooked, or boiled & drained)  Eat a consistent number of servings per week of foods MEDIUM-HIGH in Vitamin K (1 serving = 1 cup)  Asparagus (cooked, or boiled & drained) Broccoli (cooked, boiled & drained, or raw & chopped) Brussel sprouts (cooked, or boiled & drained) *serving size only =  cup Lettuce, raw (green leaf, endive, romaine) Spinach, raw Turnip greens, raw & chopped   These websites have more information on Coumadin (warfarin):  FailFactory.se; VeganReport.com.au;

## 2016-06-22 NOTE — Telephone Encounter (Signed)
Caller with [facility], pt states that she wants [facility] to be her attending pcp for hospice care. Pt will be discharged from hospital today and going home. Caller asking if [facility] would have any active standing orders for pt

## 2016-06-22 NOTE — Progress Notes (Signed)
Rounding on pt this am at around 0730 noted pt sleeping.   Will continue to monitor.  Tesla Keeler,RN

## 2016-06-22 NOTE — Consult Note (Signed)
   [facility]   06/22/2016  Kelsey Cochran 03/25/1930 [id number]  Patient screened for potential [facility] Care Management services. Patient is eligible for [facility]. Electronic medical record reveals patient's discharge plan is for Hospice and Palliative Care at discharge and notes reveals she will be going to her daughter's home.  [facility] Care [facility] services not appropriate at this time as she will receive care management from the Hospice team.  For questions please contact:   Charlesetta ShanksVictoria Tameshia Bonneville, RN BSN CCM [facility] Liaison  [phone] business mobile phone Toll free office [phone]

## 2016-06-22 NOTE — Progress Notes (Signed)
ADVANCED HF ROUNDING NOTE    SUBJECTIVE:  82 y/o woman with h/o valvular heart disease s/p MVR and TV repair with Maze by Dr. Silvestre MesiGlower in 2005. No with chronic AF, PAH and R-sided HF.   Echo 8/5 reviewed personally: LVEF 55-60% with septal flattening. RV dilated moderate HK. Moderate to severe TR  RVSP 93. MVR stable with mild MS  Yesterday diuresed with IV lasix and metolazone with great response. Feels much better. Weight down 4 pounds. Urine now clear.    Intake/Output Summary (Last 24 hours) at 06/22/16 0846 Last data filed at 06/22/16 0816  Gross per 24 hour  Intake              230 ml  Output             3250 ml  Net            -3020 ml     Current Facility-Administered Medications  Medication Dose Route Frequency Provider Last Rate Last Dose  . 0.9 %  sodium chloride infusion  250 mL Intravenous PRN Ellsworth LennoxBrittany M Strader, [place]      . acetaminophen (TYLENOL) tablet 650 mg  650 mg Oral Q4H PRN Ellsworth LennoxBrittany M Strader, PA   650 mg at 06/19/16 1035  . albuterol (PROVENTIL) (2.5 MG/3ML) 0.083% nebulizer solution 2.5 mg  2.5 mg Nebulization Q6H PRN Ellsworth LennoxBrittany M Strader, PA      . busPIRone (BUSPAR) tablet 7.5 mg  7.5 mg Oral BID Ellsworth LennoxBrittany M Strader, PA   7.5 mg at 06/21/16 2259  . carvedilol (COREG) tablet 6.25 mg  6.25 mg Oral Daily Dolores Pattyaniel R Buffi Ewton, MD   6.25 mg at 06/21/16 1111  . diphenhydrAMINE (BENADRYL) capsule 25 mg  25 mg Oral QHS PRN Ellsworth LennoxBrittany M Strader, PA   25 mg at 06/21/16 2259  . docusate sodium (COLACE) capsule 100 mg  100 mg Oral BID Ellsworth LennoxBrittany M Strader, PA   100 mg at 06/21/16 2259  . furosemide (LASIX) injection 80 mg  80 mg Intravenous Once Dolores Pattyaniel R Zamier Eggebrecht, MD      . insulin aspart (novoLOG) injection 0-15 Units  0-15 Units Subcutaneous TID [facility] Ellsworth LennoxBrittany M Strader, [place]   3 Units at 06/21/16 1722  . isosorbide mononitrate (IMDUR) 24 hr tablet 30 mg  30 mg Oral Daily Ellsworth LennoxBrittany M Strader, [place]   30 mg at 06/21/16 1112  . metolazone (ZAROXOLYN) tablet 2.5 mg  2.5 mg Oral Once  Dolores Pattyaniel R Jazarah Capili, MD      . ondansetron [facility](ZOFRAN) injection 4 mg  4 mg Intravenous Q6H PRN Ellsworth LennoxBrittany M Strader, [place]      . oxyCODONE-acetaminophen (PERCOCET/ROXICET) 5-325 MG per tablet 1 tablet  1 tablet Oral Q8H PRN Ellsworth LennoxBrittany M Strader, [place]   1 tablet at 06/20/16 1804  . sertraline (ZOLOFT) tablet 50 mg  50 mg Oral QHS Ellsworth LennoxBrittany M Strader, [place]   50 mg at 06/21/16 2259  . sodium chloride flush (NS) 0.9 % injection 3 mL  3 mL Intravenous Q12H Ellsworth LennoxBrittany M Strader, [place]   3 mL at 06/21/16 2259  . sodium chloride flush (NS) 0.9 % injection 3 mL  3 mL Intravenous PRN Ellsworth LennoxBrittany M Strader, PA      . torsemide [facility](DEMADEX) tablet 40 mg  40 mg Oral Once Dolores Pattyaniel R Sayre Mazor, MD      . Melene Muller[START ON 06/23/2016] torsemide (DEMADEX) tablet 60 mg  60 mg Oral Daily Dolores Pattyaniel R Ajahnae Rathgeber, MD      . warfarin (COUMADIN) tablet 4  mg  4 mg Oral ONCE-1800 Earnie Larsson, [facility]      . Warfarin - Pharmacist Dosing Inpatient   Does not apply q1800 Emi Holes, [facility]        Vitals:   06/21/16 1142 06/21/16 1225 06/21/16 2022 06/22/16 0425  BP:  119/67 (!) 136/59 (!) 131/48  Pulse:  72 77 69  Resp:  Temp:  98.2 F (36.8 C) 97.7 F (36.5 C) 98.2 F (36.8 C)  TempSrc:  Oral Oral Oral  SpO2: 98% 98% 100% 100%  Weight:    158 lb 11.2 oz (72 kg)  Height:        PHYSICAL EXAM General: Elderly. Frail. Lying in bed NAD. Daughter at bedside.  HEENT: Normal. Neck: JVP 9-10, no thyromegaly.  Lungs: Clear  CV: Nondisplaced PMI.  Irregular rate and rhythm, S1 click/S2, no S3/S4,2/6 TR increased P2. + RV lift  1+ pretibial edema. Wrapped. With wounds Abdomen: Soft, nontender. Mildly distended Neurologic: Alert and oriented.   Tele: Afib 70s.  LABS: Basic Metabolic Panel:  Recent Labs  16/10/96 0512 06/21/16 0411  NA 139 139  K 4.0 3.4*  CL 100* 101  CO2 26 30  GLUCOSE 99 99  BUN 32* 29*  CREATININE 1.60* 1.58*  CALCIUM 8.3* 8.4*   Liver Function Tests: No results for input(s): AST, ALT, ALKPHOS, BILITOT, PROT,  ALBUMIN in the last 72 hours. No results for input(s): LIPASE, AMYLASE in the last 72 hours. CBC: No results for input(s): WBC, NEUTROABS, HGB, HCT, MCV, PLT in the last 72 hours. Cardiac Enzymes: No results for input(s): CKTOTAL, CKMB, CKMBINDEX, TROPONINI in the last 72 hours. BNP: Invalid input(s): POCBNP D-Dimer: No results for input(s): DDIMER in the last 72 hours. Hemoglobin A1C: No results for input(s): HGBA1C in the last 72 hours. Fasting Lipid Panel: No results for input(s): CHOL, HDL, LDLCALC, TRIG, CHOLHDL, LDLDIRECT in the last 72 hours. Thyroid Function Tests: No results for input(s): TSH, T4TOTAL, T3FREE, THYROIDAB in the last 72 hours.  Invalid input(s): FREET3 Anemia Panel: No results for input(s): VITAMINB12, FOLATE, FERRITIN, TIBC, IRON, RETICCTPCT in the last 72 hours.  RADIOLOGY: No results found.    ASSESSMENT AND PLAN:  1. Severe pulm HTN with RV failure volume overload:  2. Mechanical MVR: INR supratherapeutic. Warfarin on hold. Being managed by pharmacy. 3. Chronic AF 4. CKD stage IV 5. DNR    She is diuresing well with IV lasix and addition of metolazone. BMET pending. Will give one more dose of IV lasix and metoalzone this am. Switch to torsemide tonight. Cans end home tomorrow with Hospice. Would use torsemide 60 daily with metolazone 2.5mg  1-2x per week. Will need to monitor creatinine and potassium to make sure stable on new regimen.   I have reviewed Palliative Care Notes and have had a long talk with her and her son about possible trajectory of her illness. I suspect she has < 6 months to live. Continue coumadin for MVR and AF.   Kelsey Busser,MD 8:46 AM

## 2016-06-23 ENCOUNTER — Ambulatory Visit (HOSPITAL_BASED_OUTPATIENT_CLINIC_OR_DEPARTMENT_OTHER): Payer: Medicare PPO

## 2016-06-23 DIAGNOSIS — R296 Repeated falls: Secondary | ICD-10-CM

## 2016-06-23 DIAGNOSIS — Z7983 Long term (current) use of bisphosphonates: Secondary | ICD-10-CM

## 2016-06-23 DIAGNOSIS — Z7982 Long term (current) use of aspirin: Secondary | ICD-10-CM

## 2016-06-23 DIAGNOSIS — S81801D Unspecified open wound, right lower leg, subsequent encounter: Secondary | ICD-10-CM | POA: Diagnosis not present

## 2016-06-23 DIAGNOSIS — I272 Other secondary pulmonary hypertension: Secondary | ICD-10-CM

## 2016-06-23 DIAGNOSIS — Z7984 Long term (current) use of oral hypoglycemic drugs: Secondary | ICD-10-CM

## 2016-06-23 DIAGNOSIS — W19XXXD Unspecified fall, subsequent encounter: Secondary | ICD-10-CM

## 2016-06-23 DIAGNOSIS — N184 Chronic kidney disease, stage 4 (severe): Secondary | ICD-10-CM

## 2016-06-23 DIAGNOSIS — S81812A Laceration without foreign body, left lower leg, initial encounter: Secondary | ICD-10-CM

## 2016-06-23 DIAGNOSIS — I10 Essential (primary) hypertension: Secondary | ICD-10-CM

## 2016-06-23 DIAGNOSIS — M810 Age-related osteoporosis without current pathological fracture: Secondary | ICD-10-CM

## 2016-06-23 DIAGNOSIS — F329 Major depressive disorder, single episode, unspecified: Secondary | ICD-10-CM

## 2016-06-23 DIAGNOSIS — E1151 Type 2 diabetes mellitus with diabetic peripheral angiopathy without gangrene: Secondary | ICD-10-CM

## 2016-06-23 DIAGNOSIS — J449 Chronic obstructive pulmonary disease, unspecified: Secondary | ICD-10-CM

## 2016-06-23 DIAGNOSIS — R262 Difficulty in walking, not elsewhere classified: Secondary | ICD-10-CM

## 2016-06-23 DIAGNOSIS — Z7901 Long term (current) use of anticoagulants: Secondary | ICD-10-CM

## 2016-06-23 DIAGNOSIS — I4891 Unspecified atrial fibrillation: Secondary | ICD-10-CM

## 2016-06-23 LAB — BASIC METABOLIC PANEL
Anion gap: 9 (ref 5–15)
BUN: 29 mg/dL — AB (ref 6–20)
CO2: 37 mmol/L — ABNORMAL HIGH (ref 22–32)
CREATININE: 1.43 mg/dL — AB (ref 0.44–1.00)
Calcium: 8.8 mg/dL — ABNORMAL LOW (ref 8.9–10.3)
Chloride: 92 mmol/L — ABNORMAL LOW (ref 101–111)
GFR calc Af Amer: 38 mL/min — ABNORMAL LOW (ref >60)
GFR, EST NON AFRICAN AMERICAN: 33 mL/min — AB (ref >60)
Glucose, Bld: 105 mg/dL — ABNORMAL HIGH (ref 65–99)
Potassium: 3.8 mmol/L (ref 3.5–5.1)
SODIUM: 138 mmol/L (ref 135–145)

## 2016-06-23 LAB — GLUCOSE, CAPILLARY
GLUCOSE-CAPILLARY: 102 mg/dL — AB (ref 65–99)
Glucose-Capillary: 191 mg/dL — ABNORMAL HIGH (ref 65–99)

## 2016-06-23 LAB — PROTIME-INR
INR: 2.92
PROTHROMBIN TIME: 31.1 s — AB (ref 11.4–15.2)

## 2016-06-23 MED ORDER — METOLAZONE 2.5 MG PO TABS: ORAL_TABLET | ORAL | 3 refills | Status: DC

## 2016-06-23 MED ORDER — POTASSIUM CHLORIDE ER 10 MEQ PO TBCR: 20.0000 meq | EXTENDED_RELEASE_TABLET | Freq: Every day | ORAL | 6 refills | Status: DC

## 2016-06-23 MED ORDER — POTASSIUM CHLORIDE ER 10 MEQ PO TBCR: 20.0000 meq | EXTENDED_RELEASE_TABLET | Freq: Every day | ORAL | 6 refills | Status: AC

## 2016-06-23 MED ORDER — CARVEDILOL 6.25 MG PO TABS: 6.2500 mg | ORAL_TABLET | Freq: Every day | ORAL | 6 refills | Status: AC

## 2016-06-23 MED ORDER — TORSEMIDE 20 MG PO TABS: 60.0000 mg | ORAL_TABLET | Freq: Every day | ORAL | 6 refills | Status: DC

## 2016-06-23 MED ORDER — METOLAZONE 2.5 MG PO TABS: ORAL_TABLET | ORAL | 3 refills | Status: AC

## 2016-06-23 MED ORDER — WARFARIN SODIUM 2.5 MG PO TABS: ORAL_TABLET | ORAL | 3 refills | Status: AC

## 2016-06-23 MED ORDER — CARVEDILOL 6.25 MG PO TABS
6.2500 mg | ORAL_TABLET | Freq: Every day | ORAL | 6 refills | Status: DC
Start: 2016-06-23 — End: 2016-06-23

## 2016-06-23 MED ORDER — TORSEMIDE 20 MG PO TABS: 60.0000 mg | ORAL_TABLET | Freq: Every day | ORAL | 6 refills | Status: AC

## 2016-06-23 MED ORDER — WARFARIN SODIUM 2.5 MG PO TABS: ORAL_TABLET | ORAL | 3 refills | Status: DC

## 2016-06-23 NOTE — Progress Notes (Signed)
ANTICOAGULATION CONSULT NOTE - Follow Up Consult  Pharmacy Consult for Coumadin Indication: MVR and atrial fibrillation  Allergies  Allergen Reactions  . Meperidine Hcl Nausea And Vomiting  . Sulfamethoxazole-Trimethoprim Rash    REACTION: Sulfa Allergy  . Heparin Other (See Comments)    Pt is unsure about the reaction  . Mushroom Extract Complex Itching and Other (See Comments)    Eyes water    Patient Measurements: Height: 5\' 2"  (157.5 cm) Weight: 151 lb 1.6 oz (68.5 kg) (scale b) IBW/kg (Calculated) : 50.1  Vital Signs: Temp: 98.1 F (36.7 C) (08/09 0500) Temp Source: Oral (08/09 0500) BP: 130/72 (08/09 0500) Pulse Rate: 77 (08/09 0500)  Labs:  Recent Labs  06/21/16 0411 06/22/16 0314 06/22/16 0911 06/23/16 0315  LABPROT 26.3* 27.3*  --  31.1*  INR 2.37 2.48  --  2.92  CREATININE 1.58*  --  1.51* 1.43*    Estimated Creatinine Clearance: 27.1 mL/min (by C-G formula based on SCr of 1.43 mg/dL).  Assessment: 85yof on coumadin for MVR and afib. INR 6.84 on 8/3 and patient was told to hold her coumadin x 3 days.  INR = 2.9 now trending up after boosted dose x 2 days. Will transition back toward home dose today.  I reviewed previous coag results from outpatient and INR has been fairly labile with most recent noted as well above goal. Given transition to comfort would back down on warfarin dosing at discharge.    Home dose: 2.5mg  daily except 1.25mg  on T/TR/Sat  New recommended home dose Warfarin 2.5mg  MWF, 1.25mg  T/TR/Sat/Sun  Goal of Therapy:  INR 2.5-3.5 Monitor platelets by anticoagulation protocol: Yes   Plan:  1) Coumadin 2.5mg  po x 1 tonight 2) Daily INR  Sheppard CoilFrank Wilson PharmD., BCPS Clinical Pharmacist Pager [phone] 06/23/2016 9:37 AM

## 2016-06-23 NOTE — Progress Notes (Signed)
06/23/16  1200  Reviewed discharge instructions with patient and her daughter. Both verbalized understanding of discharge instructions. Copy of discharge instructions and prescriptions given to patient.

## 2016-06-23 NOTE — Progress Notes (Signed)
ADVANCED HF ROUNDING NOTE    SUBJECTIVE:  82 y/o woman with h/o valvular heart disease s/p MVR and TV repair with Maze by Dr. Silvestre Mesi in 2005. No with chronic AF, PAH and R-sided HF.   Echo 8/5 reviewed personally: LVEF 55-60% with septal flattening. RV dilated moderate HK. Moderate to severe TR  RVSP 93. MVR stable with mild MS  Feels "suprisingly great" today.  Great urine output. Denies SOB.   Weight down 7 pounds with IV lasix yesterday and transition to po torsemide in the evening. Urine remains clear.     Intake/Output Summary (Last 24 hours) at 06/23/16 1000 Last data filed at 06/23/16 0959  Gross per 24 hour  Intake              480 ml  Output             2500 ml  Net            -2020 ml     Current Facility-Administered Medications  Medication Dose Route Frequency Provider Last Rate Last Dose  . 0.9 %  sodium chloride infusion  250 mL Intravenous PRN Ellsworth Lennox, [place]      . acetaminophen (TYLENOL) tablet 650 mg  650 mg Oral Q4H PRN Ellsworth Lennox, [place]   650 mg at 06/22/16 2151  . albuterol (PROVENTIL) (2.5 MG/3ML) 0.083% nebulizer solution 2.5 mg  2.5 mg Nebulization Q6H PRN Ellsworth Lennox, PA      . busPIRone (BUSPAR) tablet 7.5 mg  7.5 mg Oral BID Ellsworth Lennox, PA   7.5 mg at 06/22/16 2152  . carvedilol (COREG) tablet 6.25 mg  6.25 mg Oral Daily Dolores Patty, MD   6.25 mg at 06/22/16 0957  . diphenhydrAMINE (BENADRYL) capsule 25 mg  25 mg Oral QHS PRN Ellsworth Lennox, PA   25 mg at 06/22/16 2152  . docusate sodium (COLACE) capsule 100 mg  100 mg Oral BID Ellsworth Lennox, [place]   100 mg at 06/22/16 2152  . insulin aspart (novoLOG) injection 0-15 Units  0-15 Units Subcutaneous TID [facility] Ellsworth Lennox, [place]   5 Units at 06/22/16 1820  . isosorbide mononitrate (IMDUR) 24 hr tablet 30 mg  30 mg Oral Daily Ellsworth Lennox, [place]   30 mg at 06/22/16 0957  . ondansetron (ZOFRAN) injection 4 mg  4 mg Intravenous Q6H PRN Ellsworth Lennox, [place]      .  oxyCODONE-acetaminophen (PERCOCET/ROXICET) 5-325 MG per tablet 1 tablet  1 tablet Oral Q8H PRN Ellsworth Lennox, [place]   1 tablet at 06/20/16 1804  . sertraline (ZOLOFT) tablet 50 mg  50 mg Oral QHS Ellsworth Lennox, [place]   50 mg at 06/22/16 2153  . sodium chloride flush (NS) 0.9 % injection 3 mL  3 mL Intravenous Q12H Ellsworth Lennox, [place]   3 mL at 06/22/16 2158  . sodium chloride flush (NS) 0.9 % injection 3 mL  3 mL Intravenous PRN Ellsworth Lennox, PA      . torsemide [facility]) tablet 60 mg  60 mg Oral Daily Dolores Patty, MD      . Warfarin - Pharmacist Dosing Inpatient   Does not apply q1800 Emi Holes, [facility]        Vitals:   06/22/16 0425 06/22/16 1226 06/22/16 2100 06/23/16 0500  BP: (!) 131/48 116/75 (!) 126/49 130/72  Pulse: 69 74 77 77  Resp: Temp:  98.2 F (36.8 C) 98 F (36.7 C) 98.2 F (36.8 C) 98.1 F (36.7 C)  TempSrc: Oral Oral Oral Oral  SpO2: 100% 100% 100% 100%  Weight: 158 lb 11.2 oz (72 kg)   151 lb 1.6 oz (68.5 kg)  Height:        PHYSICAL EXAM General: Elderly. Frail. Chronically ill appearing. Sitting on edge of bed. Daughter  Present  HEENT: Normal. Neck: JVP 7-8, no thyromegaly.  Lungs: CTAB, normal effort CV: Nondisplaced PMI.  Irregular rate and rhythm, S1 click/S2, no S3/S4,2/6 TR increased P2. + RV lift  1+ pretibial edema. Wrapped. With wounds Abdomen: Soft, NT, ND, no HSM. No bruits or masses. +BS No longer distended  Neurologic: Alert and oriented.   Tele: Reviewed personally, Afib 60-70s.   LABS: Basic Metabolic Panel:  Recent Labs  16/08/9607/08/17 0911 06/23/16 0315  NA 138 138  K 3.0* 3.8  CL 94* 92*  CO2 34* 37*  GLUCOSE 168* 105*  BUN 28* 29*  CREATININE 1.51* 1.43*  CALCIUM 9.1 8.8*   Liver Function Tests: No results for input(s): AST, ALT, ALKPHOS, BILITOT, PROT, ALBUMIN in the last 72 hours. No results for input(s): LIPASE, AMYLASE in the last 72 hours. CBC: No results for input(s): WBC, NEUTROABS,  HGB, HCT, MCV, PLT in the last 72 hours. Cardiac Enzymes: No results for input(s): CKTOTAL, CKMB, CKMBINDEX, TROPONINI in the last 72 hours. BNP: Invalid input(s): POCBNP D-Dimer: No results for input(s): DDIMER in the last 72 hours. Hemoglobin A1C: No results for input(s): HGBA1C in the last 72 hours. Fasting Lipid Panel: No results for input(s): CHOL, HDL, LDLCALC, TRIG, CHOLHDL, LDLDIRECT in the last 72 hours. Thyroid Function Tests: No results for input(s): TSH, T4TOTAL, T3FREE, THYROIDAB in the last 72 hours.  Invalid input(s): FREET3 Anemia Panel: No results for input(s): VITAMINB12, FOLATE, FERRITIN, TIBC, IRON, RETICCTPCT in the last 72 hours.  RADIOLOGY: No results found.    ASSESSMENT AND PLAN:  1. Severe pulm HTN with RV failure volume overload:  2. Mechanical MVR: INR supratherapeutic. Warfarin on hold. Being managed by pharmacy. 3. Chronic AF 4. CKD stage IV 5. DNR   Has diuresed well with IV lasix.  Will send home on torsemide 60 mg daily with weekly metolazone + extra as needed  Will need to monitor creatinine and potassium to make sure stable on new regimen. Have scheduled follow up for next week.   Hospice appropriate. Suspect she has < 6 months to live. Continue coumadin for MVR and AF.   Will have INRs sent to coumadin clinic.  Graciella FreerMichael Andrew Tillery, PA-C 10:00 AM  Patient seen and examined with Otilio SaberAndy Tillery, PA-C. We discussed all aspects of the encounter. I agree with the assessment and plan as stated above.   She is much improved with IV diuresis and metolazone. Renal function improved. Have switched to torsemide and once weekly metolazone. Can take extra as needed. Reinforced need for daily weights and reviewed use of sliding scale diuretics. Will follow in HF Clinic. Ok for d/c today. Discussed with patient and her daughter.   Tensley Wery,MD 8:43 PM

## 2016-06-23 NOTE — Care Management Important Message (Signed)
Important Message  Patient Details  Name: Kelsey Cochran MRN: [id number] Date of Birth: 05/11/1930   Medicare Important Message Given:  Yes    Dat Derksen, Stephan MinisterSusan Coleman 06/23/2016, 3:20 PM

## 2016-06-23 NOTE — Discharge Summary (Signed)
Advanced Heart Failure Discharge Note   Discharge Summary   Patient ID: Kelsey Cochran MRN: [id number], DOB/AGE: 02-03-1930 82 y.o. Admit date: 06/18/2016 D/C date:     06/23/2016   Primary Discharge Diagnoses:  1. Severe pulm HTN with RV failure volume overload:  2. Mechanical MVR: INR supratherapeutic. Warfarin on hold. Being managed by pharmacy. 3. Chronic AF 4. CKD stage IV 5. DNR   Hospital Course:   Kelsey Cochran is a 82 y.o. female with valvular heart disease s/p MVR and TV repair with Maze by Dr. Silvestre Mesi in 2005. No with chronic AF, PAH and R-sided HF. Admitted 06/18/16 with worsening DOE, edema, and 15 lb weight gain.  INR was supratherapeutic at 6.84 on admission, thought likely 2/2 hepato-congestion with her volume overload.   Echo 06/19/16: LVEF 55-60% with septal flattening. RV dilated moderate HK. Moderate to severe TR  RVSP 93. MVR stable with mild MS  With end stage PAH and RV failure complicated by severe cardiorenal syndrome, had long discussion with family about trajectory of her illness and pt decided on DNR/DNI.  Also enrolled with Hospice for home support.   Pt was given IV lasix and metolazone with good response.  Decision was made to switch lasix to torsemide with metolazone on discharge. Pt did have some symptomatic improvement with diuresis.   Overall she is down 6.6 L and down 11 lbs from highest weight this admission. She will be discharged home in stable, though tenuous condition with Hospice care, with a projected prognosis of <6 months.   INR trended down to therapeutic range with coumadin on hold, and remained stable after resumption.   Hospice to draw INR and fax results to coumadin clinic at [phone]. Should draw INR 06/28/16 and then further per Coumadin Clinic.   We will follow up in the office as below to assure she is stable on new diuretic regimen, will stretch out appointments from there.  Paper prescriptions for new HF meds given.   Discharge Weight  Range: 151 lbs Discharge Vitals: Blood pressure 128/80, pulse 76, temperature 98.1 F (36.7 C), temperature source Oral, resp. rate 20, height  (1.575 m), weight 151 lb 1.6 oz (68.5 kg), SpO2 100 %.  Labs: Lab Results  Component Value Date   WBC 7.4 06/03/2016   HGB 11.2 (L) 06/03/2016   HCT 35.5 (L) 06/03/2016   MCV 102.3 (H) 06/03/2016   PLT 182 06/03/2016     Recent Labs Lab 06/23/16 0315  NA 138  K 3.8  CL 92*  CO2 37*  BUN 29*  CREATININE 1.43*  CALCIUM 8.8*  GLUCOSE 105*   Lab Results  Component Value Date   CHOL 98 12/04/2015   HDL 24.30 (L) 12/04/2015   LDLCALC 52 12/04/2015   TRIG 105.0 12/04/2015   BNP (last 3 results) No results for input(s): BNP in the last 8760 hours.  ProBNP (last 3 results) No results for input(s): PROBNP in the last 8760 hours.   Diagnostic Studies/Procedures   No results found.  Discharge Medications     Medication List    STOP taking these medications   aspirin 81 MG tablet   atorvastatin 20 MG tablet Commonly known as:  LIPITOR   furosemide 80 MG tablet Commonly known as:  LASIX   Vitamin D3 5000 units Caps     TAKE these medications   albuterol (2.5 MG/3ML) 0.083% nebulizer solution Commonly known as:  PROVENTIL Take 3 mLs (2.5 mg total) by nebulization every 6 (  six) hours as needed for wheezing or shortness of breath.   alendronate 35 MG tablet Commonly known as:  FOSAMAX TAKE 1 TABLET EVERY 7 DAYS IN THE MORNING AS DIRECTED. SEE PACKAGE FOR ADDITIONAL INSTRUCTIONS   busPIRone 7.5 MG tablet Commonly known as:  BUSPAR TAKE 1 TABLET BY MOUTH TWICE DAILY   carvedilol 6.25 MG tablet Commonly known as:  COREG Take 1 tablet (6.25 mg total) by mouth daily. What changed:  medication strength  how much to take   COLACE 100 MG capsule Generic drug:  docusate sodium Take 100 mg by mouth 2 (two) times daily.   fenofibrate 160 MG tablet TAKE 1 TABLET EVERY DAY   folic acid 800 MCG tablet Commonly  known as:  FOLVITE Take 400 mcg by mouth daily.   glipiZIDE 2.5 MG 24 hr tablet Commonly known as:  GLUCOTROL XL TAKE 1 TABLET BY MOUTH DAILY   isosorbide mononitrate 30 MG 24 hr tablet Commonly known as:  IMDUR Take 30 mg by mouth daily.   methocarbamol 500 MG tablet Commonly known as:  ROBAXIN TAKE 1 TABLET(500 MG) BY MOUTH TWICE DAILY AS NEEDED What changed:  See the new instructions.   metolazone 2.5 MG tablet Commonly known as:  ZAROXOLYN Take 1 tablet (2.5 mg total) every Wednesday. Take an extra tab as needed for weight gain. Take extra 20 meq of potassium when you take.   nitroGLYCERIN 0.4 MG SL tablet Commonly known as:  NITROSTAT Place 1 tablet (0.4 mg total) under the tongue every 5 (five) minutes as needed for chest pain.   oxyCODONE-acetaminophen 5-325 MG tablet Commonly known as:  ROXICET Take 1 tablet by mouth every 8 (eight) hours as needed for severe pain. What changed:  how much to take   potassium chloride 10 MEQ tablet Commonly known as:  K-DUR Take 2 tablets (20 mEq total) by mouth daily. Take an extra 20 meq (2 tabs) when you take metolazone.   sertraline 50 MG tablet Commonly known as:  ZOLOFT TAKE 1 TABLET(50 MG) BY MOUTH DAILY   SLEEP AID PO Take 1 tablet by mouth as needed (for sleep).   torsemide 20 MG tablet Commonly known as:  DEMADEX Take 3 tablets (60 mg total) by mouth daily.   triamcinolone cream 0.1 % Commonly known as:  KENALOG Apply 1 application topically 2 (two) times daily.   TYLENOL PM EXTRA STRENGTH PO Take 1 tablet by mouth daily as needed (for sleep). As needed   warfarin 2.5 MG tablet Commonly known as:  COUMADIN Take 2.5 mg by mouth daily except Tuesday, Thursday, Saturday, Sunday take 1.25 mg by mouth. What changed:  See the new instructions.       Disposition   The patient will be discharged in stable condition to home. Discharge Instructions    Diet - low sodium heart healthy    Complete by:  As directed    Heart Failure patients record your daily weight using the same scale at the same time of day    Complete by:  As directed   Increase activity slowly    Complete by:  As directed     Follow-up Information    Arvilla Meres, MD Follow up on 07/01/2016.   Specialty:  Cardiology Why:  at 1140 for post hospital follow up.  Please bring all of your medications/list to your appointment. The code for parking is 0003. Contact information: [address] [place] [address] [phone]  Duration of Discharge Encounter: Greater than 35 minutes   Signed, Graciella FreerMichael Andrew Tillery PA-C 06/23/2016, 11:07 AM   Patient seen and examined with Otilio SaberAndy Tillery, PA-C. We discussed all aspects of the encounter. I agree with the assessment and plan as stated above. She is ready for D/c f/u in HF Clinic.   Anastasya Jewell,MD 12:29 AM

## 2016-06-23 NOTE — Progress Notes (Signed)
[facility]. Abelino DerrickB Hindy Perrault RN,MHA BSN [phone]

## 2016-06-28 ENCOUNTER — Ambulatory Visit (INDEPENDENT_AMBULATORY_CARE_PROVIDER_SITE_OTHER): Payer: Medicare PPO | Admitting: Cardiology

## 2016-06-28 DIAGNOSIS — I059 Rheumatic mitral valve disease, unspecified: Secondary | ICD-10-CM

## 2016-06-28 DIAGNOSIS — Z5181 Encounter for therapeutic drug level monitoring: Secondary | ICD-10-CM

## 2016-06-28 DIAGNOSIS — Z9889 Other specified postprocedural states: Secondary | ICD-10-CM

## 2016-06-28 LAB — POCT INR: INR: 3.2

## 2016-07-01 ENCOUNTER — Encounter (HOSPITAL_COMMUNITY): Payer: Self-pay | Admitting: Internal Medicine

## 2016-07-01 ENCOUNTER — Ambulatory Visit (HOSPITAL_COMMUNITY)
Admission: RE | Admit: 2016-07-01 | Discharge: 2016-07-01 | Disposition: A | Discharge status: Home / Self Care | Source: Ambulatory Visit | Attending: Internal Medicine | Admitting: Internal Medicine

## 2016-07-01 VITALS — BP 116/62 | HR 70 | Wt 144.5 lb

## 2016-07-01 DIAGNOSIS — I272 Other secondary pulmonary hypertension: Secondary | ICD-10-CM | POA: Diagnosis not present

## 2016-07-01 DIAGNOSIS — Z7984 Long term (current) use of oral hypoglycemic drugs: Secondary | ICD-10-CM | POA: Insufficient documentation

## 2016-07-01 DIAGNOSIS — Z7901 Long term (current) use of anticoagulants: Secondary | ICD-10-CM | POA: Insufficient documentation

## 2016-07-01 DIAGNOSIS — Z66 Do not resuscitate: Secondary | ICD-10-CM | POA: Diagnosis not present

## 2016-07-01 DIAGNOSIS — Z825 Family history of asthma and other chronic lower respiratory diseases: Secondary | ICD-10-CM | POA: Insufficient documentation

## 2016-07-01 DIAGNOSIS — Z79899 Other long term (current) drug therapy: Secondary | ICD-10-CM | POA: Insufficient documentation

## 2016-07-01 DIAGNOSIS — I130 Hypertensive heart and chronic kidney disease with heart failure and stage 1 through stage 4 chronic kidney disease, or unspecified chronic kidney disease: Secondary | ICD-10-CM | POA: Diagnosis not present

## 2016-07-01 DIAGNOSIS — Z952 Presence of prosthetic heart valve: Secondary | ICD-10-CM | POA: Insufficient documentation

## 2016-07-01 DIAGNOSIS — Z8673 Personal history of transient ischemic attack (TIA), and cerebral infarction without residual deficits: Secondary | ICD-10-CM | POA: Diagnosis not present

## 2016-07-01 DIAGNOSIS — E1122 Type 2 diabetes mellitus with diabetic chronic kidney disease: Secondary | ICD-10-CM | POA: Insufficient documentation

## 2016-07-01 DIAGNOSIS — I5033 Acute on chronic diastolic (congestive) heart failure: Secondary | ICD-10-CM | POA: Diagnosis not present

## 2016-07-01 DIAGNOSIS — E7800 Pure hypercholesterolemia, unspecified: Secondary | ICD-10-CM | POA: Insufficient documentation

## 2016-07-01 DIAGNOSIS — I482 Chronic atrial fibrillation: Secondary | ICD-10-CM | POA: Diagnosis not present

## 2016-07-01 DIAGNOSIS — I2721 Secondary pulmonary arterial hypertension: Secondary | ICD-10-CM

## 2016-07-01 DIAGNOSIS — N184 Chronic kidney disease, stage 4 (severe): Secondary | ICD-10-CM | POA: Diagnosis not present

## 2016-07-01 LAB — BASIC METABOLIC PANEL
ANION GAP: 12 (ref 5–15)
BUN: 42 mg/dL — ABNORMAL HIGH (ref 6–20)
CALCIUM: 9.5 mg/dL (ref 8.9–10.3)
CHLORIDE: 95 mmol/L — AB (ref 101–111)
CO2: 35 mmol/L — AB (ref 22–32)
Creatinine, Ser: 1.55 mg/dL — ABNORMAL HIGH (ref 0.44–1.00)
GFR calc non Af Amer: 30 mL/min — ABNORMAL LOW (ref >60)
GFR, EST AFRICAN AMERICAN: 35 mL/min — AB (ref >60)
Glucose, Bld: 95 mg/dL (ref 65–99)
Potassium: 3.1 mmol/L — ABNORMAL LOW (ref 3.5–5.1)
Sodium: 142 mmol/L (ref 135–145)

## 2016-07-01 NOTE — Progress Notes (Signed)
ADVANCED HF CLINIC NOTE  PCP: Primary Cardiologist:  HPI:  82 y/o woman with h/o valvular heart disease s/p MVR and TV repair with Maze by Dr. Silvestre [facility]lower in 2005. No with chronic AF, PAH and R-sided HF.   Echo 8/5 reviewed personally: LVEF 55-60% with septal flattening. RV dilated moderate HK. Moderate to severe TR  RVSP 93. MVR stable with mild MS   Recently admitted with volume overload and FTT. Diuresed with IV lasix. Switched to po torsemide. Enrolled with hospice.   Has lost about 20 pounds on torsemide.   Volume status much improved with torsemide. Took metolazone for the first time yesterday and felt a bit wiped out. Other than that getting back to baseline. Able to walk to bathroom. Now has home Hospice. Significant bruising with coumadin     ROS: All systems negative except as listed in HPI, PMH and Problem List.  SH:  Social History   Social History  . Marital status: Widowed    Spouse name: N/A  . Number of children: 6  . Years of education: 3715   Occupational History  . Retired Retired    Charity fundraiserN   Social History Main Topics  . Smoking status: Never Smoker  . Smokeless tobacco: Never Used  . Alcohol use No  . Drug use: No  . Sexual activity: No   Other Topics Concern  . Not on file   Social History Narrative  . No narrative on file    FH:  Family History  Problem Relation Age of Onset  . Asthma Daughter   . Allergies Daughter   . Allergies Daughter   . Cancer Sister     bladder    Past Medical History:  Diagnosis Date  . Anemia   . Arthritis   . Atrial fibrillation (HCC)   . CRF (chronic renal failure)   . Diabetes mellitus   . Diastolic CHF, chronic (HCC)   . Hypercholesterolemia   . Hypertension   . Pulmonary hypertension (HCC)   . S/P MVR (mitral valve replacement) 2005   [facility] 2005  [facility]  . S/P tricuspid valve repair 2005   [facility]  . TIA (transient ischemic attack)   . TIA (transient ischemic attack)     Current  Outpatient Prescriptions  Medication Sig Dispense Refill  . busPIRone (BUSPAR) 7.5 MG tablet TAKE 1 TABLET BY MOUTH TWICE DAILY 60 tablet 6  . carvedilol (COREG) 6.25 MG tablet Take 1 tablet (6.25 mg total) by mouth daily. 30 tablet 6  . Diphenhydramine-APAP, sleep, (TYLENOL PM EXTRA STRENGTH PO) Take 1 tablet by mouth daily as needed (for sleep). As needed    . docusate sodium (COLACE) 100 MG capsule Take 100 mg by mouth 2 (two) times daily.      . Doxylamine Succinate, Sleep, (SLEEP AID PO) Take 1 tablet by mouth as needed (for sleep).    . fenofibrate 160 MG tablet TAKE 1 TABLET EVERY DAY 90 tablet 1  . folic acid (FOLVITE) 800 MCG tablet Take 400 mcg by mouth daily.      Marland Kitchen. glipiZIDE (GLUCOTROL XL) 2.5 MG 24 hr tablet TAKE 1 TABLET BY MOUTH DAILY 30 tablet 5  . isosorbide mononitrate (IMDUR) 30 MG 24 hr tablet Take 30 mg by mouth daily.    . methocarbamol (ROBAXIN) 500 MG tablet TAKE 1 TABLET(500 MG) BY MOUTH TWICE DAILY AS NEEDED 60 tablet 0  . metolazone (ZAROXOLYN) 2.5 MG tablet Take 1 tablet (2.5 mg total) every  Wednesday. Take an extra tab as needed for weight gain. Take extra 20 meq of potassium when you take. 10 tablet 3  . oxyCODONE-acetaminophen (ROXICET) 5-325 MG tablet Take 1 tablet by mouth every 8 (eight) hours as needed for severe pain. 45 tablet 0  . potassium chloride (K-DUR) 10 MEQ tablet Take 2 tablets (20 mEq total) by mouth daily. Take an extra 20 meq (2 tabs) when you take metolazone. 90 tablet 6  . sertraline (ZOLOFT) 50 MG tablet TAKE 1 TABLET(50 MG) BY MOUTH DAILY 30 tablet 3  . torsemide (DEMADEX) 20 MG tablet Take 3 tablets (60 mg total) by mouth daily. 90 tablet 6  . albuterol (PROVENTIL) (2.5 MG/3ML) 0.083% nebulizer solution Take 3 mLs (2.5 mg total) by nebulization every 6 (six) hours as needed for wheezing or shortness of breath. (Patient not taking: Reported on 07/01/2016) 75 mL 12  . nitroGLYCERIN (NITROSTAT) 0.4 MG SL tablet Place 1 tablet (0.4 mg total) under  the tongue every 5 (five) minutes as needed for chest pain. (Patient not taking: Reported on 07/01/2016) 25 tablet 3  . warfarin (COUMADIN) 2.5 MG tablet Take 2.5 mg by mouth daily except Tuesday, Thursday, Saturday, Sunday take 1.25 mg by mouth. 35 tablet 3   No current facility-administered medications for this encounter.     Vitals:   07/01/16 1148  BP: 116/62  Pulse: 70  SpO2: 100%  Weight: 144 lb 8 oz (65.5 kg)    PHYSICAL EXAM: General: Elderly. Frail. Chronically ill appearing. Sitting in WC.  Daughters present  HEENT: Normal. Neck: JVP 9, no thyromegaly.  Lungs: CTAB, normal effort CV: Nondisplaced PMI.  Irregular rate and rhythm, S1 click/S2, no S3/S4,2/6 TR increased P2. + RV lift  No pretibial edema.  Abdomen: Soft, NT, ND, no HSM. No bruits or masses. +BS  Extremities: No edema. Severe ecchymosis Neurologic: Alert and oriented.    ASSESSMENT & PLAN: 1. Severe pulm HTN with RV failure volume overload:  2. Mechanical MVR:  3. Chronic AF 4. CKD stage IV 5. DNR   Overall doing well. Volume status looks great. Will check labs today. Continue torsemide 60 daily for now and use metolazone only prn. If weight continues to drop can decrease torsemide to 40 daily. Continue coumadin for MVR. Stop Imdur and fenofibrate.   Total time spent 45 minutes. Over half that time spent discussing above.    Bensimhon, Daniel,MD 12:46 PM

## 2016-07-01 NOTE — Addendum Note (Signed)
Encounter addended by: Noralee SpaceHeather M Branna Cortina, RN on: 07/01/2016  1:12 PM<BR>    Actions taken: Medication long-term status modified, Visit diagnoses modified, Order Entry activity accessed, Diagnosis association updated, Sign clinical note

## 2016-07-01 NOTE — Patient Instructions (Signed)
Stop Isosorbide  Stop Fenofibrate  Lab today  Your physician recommends that you schedule a follow-up appointment in: 4-6 weeks

## 2016-07-05 ENCOUNTER — Ambulatory Visit: Payer: Medicare PPO | Admitting: Family Medicine

## 2016-07-05 ENCOUNTER — Ambulatory Visit (INDEPENDENT_AMBULATORY_CARE_PROVIDER_SITE_OTHER): Payer: Medicare PPO | Admitting: Cardiovascular Disease

## 2016-07-05 DIAGNOSIS — I059 Rheumatic mitral valve disease, unspecified: Secondary | ICD-10-CM

## 2016-07-05 DIAGNOSIS — Z9889 Other specified postprocedural states: Secondary | ICD-10-CM

## 2016-07-05 DIAGNOSIS — Z5181 Encounter for therapeutic drug level monitoring: Secondary | ICD-10-CM

## 2016-07-05 LAB — POCT INR: INR: 2.9

## 2016-07-07 ENCOUNTER — Encounter (HOSPITAL_COMMUNITY): Payer: Self-pay | Admitting: *Deleted

## 2016-07-07 ENCOUNTER — Ambulatory Visit: Payer: Medicare PPO | Admitting: Family Medicine

## 2016-07-07 ENCOUNTER — Telehealth: Payer: Self-pay | Admitting: Family Medicine

## 2016-07-07 ENCOUNTER — Telehealth (HOSPITAL_COMMUNITY): Payer: Self-pay | Admitting: *Deleted

## 2016-07-07 NOTE — Telephone Encounter (Signed)
Notes Recorded by Modesta MessingJasmine Q Auble, CMA on 07/07/2016 at 4:13 PM EDT Unable to reach patient. Letter mailed. ------  Notes Recorded by Modesta MessingJasmine Q Aye, CMA on 07/05/2016 at 4:36 PM EDT Left message for patient to call back. Will try patient again tomorrow if still no answer will mail lab letter  ------  Notes Recorded by Modesta MessingJasmine Q Thornley, CMA on 07/02/2016 at 3:49 PM EDT Left message for patient to call back.  ------  Notes Recorded by Dolores Pattyaniel R Bensimhon, MD on 07/02/2016 at 1:40 AM EDT Creatinine stable. K low. Give kcl 60 x 1. Then increase previous kcl dose by 20 daily. Recheck 1 week.    Ref Range & Units 6d ago (07/01/16) 2wk ago (06/23/16) 2wk ago (06/22/16)   Sodium 135 - 145 mmol/L 142 138 138   Potassium 3.5 - 5.1 mmol/L 3.1  3.8CM 3.0    Chloride 101 - 111 mmol/L 95  92  94    CO2 22 - 32 mmol/L 35  37  34    Glucose, Bld 65 - 99 mg/dL 95 161105  [id number]    BUN 6 - 20 mg/dL 42  29  28    Creatinine, Ser 0.44 - 1.00 mg/dL 0.451.55  4.091.43  8.111.51    Calcium 8.9 - 10.3 mg/dL 9.5 8.8  9.1   GFR calc non Af Amer >60 mL/min 30  33  31    GFR calc Af Amer >60 mL/min 35  38CM

## 2016-07-07 NOTE — Telephone Encounter (Signed)
Daughter states that pt is very week and unable to come to appt today, daughter asking if she should reschedule pt since she is in hospice.

## 2016-07-07 NOTE — Telephone Encounter (Signed)
It is up to the family how they would like to proceed.  I am happy to see her if and when she needs me but I certainly understand that she may not feel up to it- which is why Hospice is now involved and will visit her at home.  I just want the family to know how sorry I am for all that they are going through and I'm thinking of all of them during this difficult time.

## 2016-07-08 NOTE — Telephone Encounter (Signed)
Patient daughter notified of PCP recommendations and is agreement and expresses an understanding.    

## 2016-07-12 ENCOUNTER — Telehealth (HOSPITAL_COMMUNITY): Payer: Self-pay | Admitting: *Deleted

## 2016-07-12 ENCOUNTER — Ambulatory Visit (INDEPENDENT_AMBULATORY_CARE_PROVIDER_SITE_OTHER): Payer: Medicare PPO

## 2016-07-12 DIAGNOSIS — I4891 Unspecified atrial fibrillation: Secondary | ICD-10-CM

## 2016-07-12 DIAGNOSIS — Z5181 Encounter for therapeutic drug level monitoring: Secondary | ICD-10-CM

## 2016-07-12 DIAGNOSIS — I059 Rheumatic mitral valve disease, unspecified: Secondary | ICD-10-CM

## 2016-07-12 DIAGNOSIS — Z9889 Other specified postprocedural states: Secondary | ICD-10-CM

## 2016-07-12 LAB — POCT INR: INR: 2.7

## 2016-07-12 NOTE — Telephone Encounter (Signed)
Pt's daughter called regarding a letter they received from our office.  Advised about low K and Dr Bensimhon's recommendations.  She states pt is not eating or drinking much at all and has not taken meds either.  She states Hospice RN was out earlier and will come back on Fri [facility].  Advised if not taking meds thats ok, if she does take Torsemide to give her KCL, she is agreeable

## 2016-07-15 ENCOUNTER — Telehealth: Payer: Self-pay | Admitting: Family Medicine

## 2016-07-15 NOTE — Telephone Encounter (Signed)
Caller states that pt is declining, she is only eating bites of food through out that day and drinking around 8oz of water. Caller also states that pt is refusing all meds expect for coumadin, colace, and percocet.

## 2016-07-16 NOTE — Telephone Encounter (Signed)
Noted  

## 2016-07-20 ENCOUNTER — Other Ambulatory Visit: Payer: Self-pay | Admitting: Family Medicine

## 2016-07-23 ENCOUNTER — Telehealth: Payer: Self-pay | Admitting: Family Medicine

## 2016-07-23 ENCOUNTER — Telehealth (HOSPITAL_COMMUNITY): Payer: Self-pay | Admitting: *Deleted

## 2016-07-23 NOTE — Telephone Encounter (Signed)
Please touch base w/ family on Monday as things seem to be moving rapidly

## 2016-07-23 NOTE — Telephone Encounter (Signed)
Hospice RN called to let us know pt had been transferred to [facility]

## 2016-07-23 NOTE — Telephone Encounter (Signed)
Called and spoke with pt daughter, she said that in the 10 hours since she had seen her mom yesterday there was a big decline. States that she feels as though patient is able to let herself go because she had been adamant in the past that she would not pass at her home.. Pt daughter says that pt is comfortable and getting amazing care. She thanked us for caring for her mom "not only as a patient, but as a family member"

## 2016-07-23 NOTE — Telephone Encounter (Signed)
Caller with [facility]osp[facility] states that pt [facility]  [facility] for [facility] life care.

## 2016-07-23 NOTE — Telephone Encounter (Signed)
Please call daughter, Asher MuirJamie and see if there's anything we can do

## 2016-07-26 NOTE — Telephone Encounter (Signed)
Spoke with pt daughter, she told me that pt is actively dying but has not passed yet. The family will let us know when she passes and the visitation times.

## 2016-07-27 ENCOUNTER — Ambulatory Visit: Payer: Self-pay | Admitting: *Deleted

## 2016-07-27 DIAGNOSIS — Z9889 Other specified postprocedural states: Secondary | ICD-10-CM

## 2016-07-27 DIAGNOSIS — I059 Rheumatic mitral valve disease, unspecified: Secondary | ICD-10-CM

## 2016-07-27 DIAGNOSIS — Z5181 Encounter for therapeutic drug level monitoring: Secondary | ICD-10-CM

## 2016-07-27 DIAGNOSIS — I4891 Unspecified atrial fibrillation: Secondary | ICD-10-CM

## 2016-08-11 ENCOUNTER — Encounter (HOSPITAL_COMMUNITY): Payer: Medicare PPO | Admitting: Internal Medicine

## 2016-08-15 DEATH — deceased
